# Patient Record
Sex: Female | Born: 1940
Health system: Southern US, Community
[De-identification: ages and names within clinical notes are randomized; demographics above are authoritative.]

## PROBLEM LIST (undated history)

## (undated) DIAGNOSIS — I4891 Unspecified atrial fibrillation: Secondary | ICD-10-CM

## (undated) DIAGNOSIS — R0602 Shortness of breath: Secondary | ICD-10-CM

## (undated) DIAGNOSIS — T7840XA Allergy, unspecified, initial encounter: Secondary | ICD-10-CM

## (undated) DIAGNOSIS — F329 Major depressive disorder, single episode, unspecified: Secondary | ICD-10-CM

## (undated) DIAGNOSIS — F32A Depression, unspecified: Secondary | ICD-10-CM

## (undated) DIAGNOSIS — R232 Flushing: Secondary | ICD-10-CM

## (undated) DIAGNOSIS — F419 Anxiety disorder, unspecified: Secondary | ICD-10-CM

## (undated) DIAGNOSIS — R05 Cough: Secondary | ICD-10-CM

## (undated) DIAGNOSIS — M3500 Sicca syndrome, unspecified: Secondary | ICD-10-CM

## (undated) DIAGNOSIS — M1990 Unspecified osteoarthritis, unspecified site: Secondary | ICD-10-CM

## (undated) DIAGNOSIS — H269 Unspecified cataract: Secondary | ICD-10-CM

## (undated) DIAGNOSIS — R059 Cough, unspecified: Secondary | ICD-10-CM

## (undated) DIAGNOSIS — J449 Chronic obstructive pulmonary disease, unspecified: Secondary | ICD-10-CM

## (undated) HISTORY — DX: Unspecified osteoarthritis, unspecified site: M19.90

## (undated) HISTORY — DX: Anxiety disorder, unspecified: F41.9

## (undated) HISTORY — PX: COLONOSCOPY: SHX174

## (undated) HISTORY — PX: DILATION AND CURETTAGE OF UTERUS: SHX78

## (undated) HISTORY — DX: Flushing: R23.2

## (undated) HISTORY — DX: Sjogren syndrome, unspecified: M35.00

---

## 1963-07-23 HISTORY — PX: BREAST SURGERY: SHX581

## 1971-07-23 HISTORY — PX: ABDOMINAL HYSTERECTOMY: SHX81

## 1998-11-27 ENCOUNTER — Ambulatory Visit (HOSPITAL_COMMUNITY): Admission: RE | Admit: 1998-11-27 | Discharge: 1998-11-27 | Payer: Self-pay | Admitting: *Deleted

## 2000-06-02 ENCOUNTER — Encounter: Payer: Self-pay | Admitting: Emergency Medicine

## 2000-06-03 ENCOUNTER — Inpatient Hospital Stay (HOSPITAL_COMMUNITY): Admission: EM | Admit: 2000-06-03 | Discharge: 2000-06-04 | Payer: Self-pay | Admitting: Emergency Medicine

## 2000-07-22 HISTORY — PX: CHOLECYSTECTOMY: SHX55

## 2000-10-17 ENCOUNTER — Inpatient Hospital Stay (HOSPITAL_COMMUNITY): Admission: EM | Admit: 2000-10-17 | Discharge: 2000-10-19 | Payer: Self-pay

## 2001-01-28 ENCOUNTER — Other Ambulatory Visit: Admission: RE | Admit: 2001-01-28 | Discharge: 2001-01-28 | Payer: Self-pay | Admitting: Family Medicine

## 2003-12-02 ENCOUNTER — Emergency Department (HOSPITAL_COMMUNITY): Admission: EM | Admit: 2003-12-02 | Discharge: 2003-12-03 | Payer: Self-pay | Admitting: Emergency Medicine

## 2003-12-21 ENCOUNTER — Other Ambulatory Visit: Admission: RE | Admit: 2003-12-21 | Discharge: 2003-12-21 | Payer: Self-pay | Admitting: Family Medicine

## 2006-01-16 ENCOUNTER — Other Ambulatory Visit: Admission: RE | Admit: 2006-01-16 | Discharge: 2006-01-16 | Payer: Self-pay | Admitting: Family Medicine

## 2007-04-08 ENCOUNTER — Ambulatory Visit: Payer: Self-pay | Admitting: Internal Medicine

## 2007-04-22 ENCOUNTER — Ambulatory Visit: Payer: Self-pay | Admitting: Internal Medicine

## 2007-04-22 ENCOUNTER — Encounter: Payer: Self-pay | Admitting: Internal Medicine

## 2008-07-22 HISTORY — PX: CERVICAL FUSION: SHX112

## 2008-08-25 ENCOUNTER — Encounter: Admission: RE | Admit: 2008-08-25 | Discharge: 2008-08-25 | Payer: Self-pay | Admitting: Family Medicine

## 2008-10-05 ENCOUNTER — Inpatient Hospital Stay (HOSPITAL_COMMUNITY): Admission: RE | Admit: 2008-10-05 | Discharge: 2008-10-06 | Payer: Self-pay | Admitting: Neurological Surgery

## 2008-11-21 ENCOUNTER — Encounter: Admission: RE | Admit: 2008-11-21 | Discharge: 2008-11-21 | Payer: Self-pay | Admitting: Neurological Surgery

## 2008-12-20 ENCOUNTER — Encounter: Admission: RE | Admit: 2008-12-20 | Discharge: 2008-12-20 | Payer: Self-pay | Admitting: Neurological Surgery

## 2008-12-27 ENCOUNTER — Encounter: Admission: RE | Admit: 2008-12-27 | Discharge: 2008-12-27 | Payer: Self-pay | Admitting: Neurological Surgery

## 2010-11-01 LAB — DIFFERENTIAL
Basophils Absolute: 0.1 10*3/uL (ref 0.0–0.1)
Basophils Relative: 1 % (ref 0–1)
Eosinophils Absolute: 0.1 10*3/uL (ref 0.0–0.7)
Eosinophils Relative: 1 % (ref 0–5)
Lymphocytes Relative: 25 % (ref 12–46)
Lymphs Abs: 2.4 10*3/uL (ref 0.7–4.0)
Monocytes Absolute: 0.6 10*3/uL (ref 0.1–1.0)
Monocytes Relative: 6 % (ref 3–12)
Neutro Abs: 6.4 10*3/uL (ref 1.7–7.7)
Neutrophils Relative %: 66 % (ref 43–77)

## 2010-11-01 LAB — BASIC METABOLIC PANEL
BUN: 9 mg/dL (ref 6–23)
CO2: 29 mEq/L (ref 19–32)
Calcium: 9.4 mg/dL (ref 8.4–10.5)
Chloride: 103 mEq/L (ref 96–112)
Creatinine, Ser: 0.69 mg/dL (ref 0.4–1.2)
GFR calc Af Amer: >60 mL/min (ref >60)
GFR calc non Af Amer: >60 mL/min (ref >60)
Glucose, Bld: 91 mg/dL (ref 70–99)
Potassium: 4.3 mEq/L (ref 3.5–5.1)
Sodium: 139 mEq/L (ref 135–145)

## 2010-11-01 LAB — PROTIME-INR
INR: 1 (ref 0.00–1.49)
Prothrombin Time: 13.8 seconds (ref 11.6–15.2)

## 2010-11-01 LAB — CBC
HCT: 41.5 % (ref 36.0–46.0)
Hemoglobin: 14.5 g/dL (ref 12.0–15.0)
MCHC: 34.9 g/dL (ref 30.0–36.0)
MCV: 91.3 fL (ref 78.0–100.0)
Platelets: 231 10*3/uL (ref 150–400)
RBC: 4.54 MIL/uL (ref 3.87–5.11)
RDW: 12.6 % (ref 11.5–15.5)
WBC: 9.6 10*3/uL (ref 4.0–10.5)

## 2010-11-01 LAB — APTT: aPTT: 32 seconds (ref 24–37)

## 2010-12-04 NOTE — Op Note (Signed)
NAMEKRISLYNN, GRONAU                ACCOUNT NO.:  [id number]   MEDICAL RECORD NO.:  [id number]          PATIENT TYPE:  INP   LOCATION:  3534                         FACILITY:  [facility]   PHYSICIAN:  Tia Alert, MD     DATE OF BIRTH:  [birth year]   DATE OF PROCEDURE:  10/05/2008  DATE OF DISCHARGE:                               OPERATIVE REPORT   PREOPERATIVE DIAGNOSES:  Cervical spondylosis C5-6 with biforaminal  stenosis with neck and bilateral arm pain.   POSTOPERATIVE DIAGNOSIS:  Cervical spondylosis C5-6 with biforaminal  stenosis with neck and bilateral arm pain.   PROCEDURE:  1. Decompressive anterior cervical diskectomy C5-6.  2. Anterior cervical arthrodesis C5-6 utilizing a 6-mm      corticocancellous allograft.  3. Anterior cervical plating C5-6 utilizing a 23-mm Atlantis venture      plate.   SURGEON:  Tia Alert, MD   ASSISTANT:  Donalee Citrin, MD   ANESTHESIA:  General endotracheal.   COMPLICATIONS:  None apparent.   INDICATIONS FOR PROCEDURE:  Ms. Michelle Gonzales is a pleasant 70 year old female  who presented with a long history of neck pain with bilateral arm pain,  this seem to follow up at C6 distribution.  Her right side was worse  than her left side.  She had tried medical management for quite some  time without significant relief.  She had an MRI, which showed cervical  spondylosis at C5-6 with biforaminal stenosis.  I recommended a  decompressive anterior cervical diskectomy fusion plating at C5-6.  She  understood the risks, the benefits, and expected outcome and wished to  proceed.   DESCRIPTION OF THE PROCEDURE:  The patient was taken to the operating  room and after induction of adequate generalized endotracheal  anesthesia, she was placed in supine position.  On the operating room  table, her right anterior cervical region was prepped and draped in  usual sterile fashion.  A 5 mL of local anesthesia was injected and a  small transverse incision was made  to the right midline and carried down  to the platysma, which was elevated, opened and undermined with  Metzenbaum scissors.  I then dissected plane medial to the  sternocleidomastoid muscle and internal carotid artery, and lateral to  the trachea and esophagus to expose C5-6.  Intraoperative fluoroscopy  confirmed my level and I then took down the longus colli muscles  bilaterally to expose C5-6.  The annulus was incised and the initial  diskectomy was done with pituitary rongeurs and curved curettes.  I used  a 3-mm Kerrison punch to remove the anterior-inferior part of the  endplate of C5.  We then used the high-speed drill to drill the  endplates in a rectangular fashion widening the disk space to 6 mm.  I  drilled down to the level of posterior longitudinal ligament brought in  up with the operating microscope and then opened the posterior  longitudinal ligament and removed it while undercutting the bodies of C5  and C6.  She had significant overgrown osteophytic ridging over the  foramina bilaterally.  We freshened the  C6 nerve root marching along the  superior endplate of C6 to identify the pedicles and then marched along  the pedicles to identify the C6 nerve roots and decompressed them  distally into the respective foramina.  We then palpated  circumferentially with a nerve hook to assure adequate decompression of  both the central canal and open nerve roots and therefore, via  visualization and palpation, we felt like we had a good decompression  within measuring interspace to be 6 mm.  I irrigated the interspace and  then placed a 6-mm corticocancellous allograft into the interspace at C5-  6.  We then used a 23-mm venture plate and placed two 13-mm variable  angle screws in the bodies of C5 and C6, and these locked into the plate  by the locking mechanism within the plate.  I then irrigated with saline  solution containing bacitracin, dried all bleeding points with bipolar   cautery and with surgery foam and once meticulous hemostasis was  achieved, closed with the platysma with 3-0 Vicryl and closed the  subcuticular tissue with 0 Vicryl, and closed the skin with Benzoin  Steri-Strips.  The drapes were removed.  Sterile dressing was applied.  The patient was awakened from general anesthesia and transferred to  recovery room in stable condition.  At the end of the procedure all  sponge, needle, and instrument counts were correct.      Tia Alert, MD  Electronically Signed     DSJ/MEDQ  D:  10/05/2008  T:  10/05/2008  Job:  [id number]

## 2010-12-07 NOTE — Op Note (Signed)
[place]. [facility]  Patient:    Michelle Gonzales, Michelle Gonzales                       MRN: [id number] Proc. Date: 10/18/00 Adm. Date:  [id number] Disc. Date: [id number] Attending:  Caleen Essex                           Operative Report  PREOPERATIVE DIAGNOSIS:  Cholecystitis.  POSTOPERATIVE DIAGNOSIS:  Cholecystitis.  OPERATION PERFORMED:  Laparoscopic cholecystectomy.  SURGEON:  Chevis Pretty, M.D.  ASSISTANT:  Velora Heckler, M.D.  ANESTHESIA:  General endotracheal.  DESCRIPTION OF PROCEDURE:  After informed consent was obtained, the patient was brought to the operating room and placed in supine position on the operating table.  After adequate induction of general anesthesia, the patients abdomen was prepped with Betadine and draped in the usual sterile manner.  A small superumbilical transverse incision was made with a 15 blade knife after infiltrating this area with 0.25% Marcaine.  This incision was carried down through the subcutaneous tissues using blunt dissection with a Kelly clamp and [employer]-Nav[employer] retractors until the fascia of the linea alba was identified.  The linea alba was incised with a 15 blade knife.  The inside was grasped between Kocher clamps and elevated.  The preperitoneal space was probed bluntly with a hemostat until the peritoneum was opened and access was gained to the abdominal cavity. A finger was inserted through this hole to palpate the abdominal wall.  There were no apparent adhesions.  A 0 Vicryl pursestring suture was then placed in the fascia around this hole. A Hasson cannula was then placed through this hole into the abdominal cavity and anchored with the previously placed Vicryl pursestring suture.  The laparoscope was then placed through the Hasson cannula and the abdomen was insufflated with carbon dioxide.  The liver and dome of the gallbladder were easily identifiable.  An upper midline incision was made with a 15 blade knife.  A  small transverse upper midline incision was made with a 15 blade knife after infiltrating this area with 0.25% Marcaine.  A 10 mm port was then placed bluntly through this incision.   The abdominal cavity was entered under direct vision.  Two sites were chosen laterally on the right side of the abdomen below the costal margin for placement of the 5 mm ports.  These areas were infiltrated with 0.25% Marcaine and small incisions were made with a 15 blade knife.  The 5 mm ports were then placed bluntly through these incisions into the abdominal cavity again under direct vision.  A blunt grasper was placed through the lateral most 5 mm port and used to grasp the dome of the gallbladder and elevate it anteriorly and superiorly.  Another blunt grasper was placed through the other 5 mm port and used to retract on the body and neck of the gallbladder.  A [place] dissector was placed through the upper midline port and using a combination of blunt dissection and use of the Bovie electrocautery the peritoneal reflection in the area of the gallbladder neck was opened.  Blunt dissection was carried down until the gallbladder neck cystic duct junction was readily identified.  Care was taken to keep the common duct medial to all this dissection.  The gallbladder neck cystic duct junction was dissected in a circumferential manner until the junction was clearly identified.  Three clips were then placed  proximally and one distally on the cystic duct and the duct was divided between the two.  The cystic artery was identified and again dissected circumferentially in a blunt manner with a Art gallery manager.  Two clips were placed proximally and one distally in the cystic artery and the artery was divided between the two.  The gallbladder was then removed from the liver bed using a hook cautery.  Prior to completely removing the gallbladder from the liver bed, the liver  bed was inspected and several small  bleeding points were coagulated with the Bovie electrocautery.  The gallbladder was then removed the rest of the way from the liver bed with the hook electrocautery.  The laparoscope was then placed through the upper midline port and an endoscopic bag was placed through the Hasson cannula  The gallbladder was placed within the endoscopic bag and the gallbladder was removed with the bag and the Hasson cannula through the superumbilical port.  The Hasson cannula was then replaced through the superumbilical hole.  The laparoscope was moved back to the Hasson cannula. The abdomen was then again inspected and the liver bed was found to be hemostatic.  The abdomen was irrigated with copious amounts of saline until the effluent was clear.  The ports were then all removed under direct vision. The fascia of the superumbilical port was then closed with the previously placed Vicryl pursestring suture.  The skin incisions were all closed with interrupted 4-0 Monocryl subcuticular stitches. Benzoin and Steri-Strips were then applied.  The patient tolerated the procedure well.  At the end of the case all needle, sponge and instrument counts were correct.  The patient was awakened and taken to the recovery room in stable condition. DD:  10/21/00 TD:  10/21/00 Job: [id number]

## 2010-12-07 NOTE — Cardiovascular Report (Signed)
[place]. [facility]  Patient:    Michelle Gonzales, Michelle Gonzales                       MRN: [id number] Proc. Date: 06/04/00 Adm. Date:  [id number] Attending:  Rollene Rotunda CC:         [name], M.D.  Rollene Rotunda, M.D. [facility]  Cardiac Cath Lab   Cardiac Catheterization  PROCEDURES PERFORMED:  Left heart catheterization, coronary angiography, left ventriculography.  INDICATIONS:  Ms. Fronek is a 70 year old woman who presented with chest pain and was referred for cardiac catheterization.  DESCRIPTION OF PROCEDURE:  A 6 French sheath was placed in the right femoral artery.  Standard Judkins 6 French catheters were utilized.  Contrast was Omnipaque.  There were no complications.  RESULTS:  HEMODYNAMICS:  Left ventricular pressure 136/14.  Aortic pressure 134/62. There was no aortic valve gradient.  LEFT VENTRICULOGRAM:  Wall motion is normal.  Ejection fraction calculated at 63%.  There is 2+ mild mitral regurgitation.  CORONARY ARTERIOGRAPHY:  (Right dominant).  Left main:  Normal.  Left anterior descending:  The LAD has a 25% stenosis in the mid vessel.  The LAD gives rise to three small diagonal branches.  Left circumflex:  The left circumflex gives rise to a large first marginal branch and a small second marginal branch.  There is a less than 20% stenosis in the first marginal branch.  Right coronary artery:  The right coronary artery gives rise to a normal size posterior descending artery and a small posterolateral branch.  There is a 20% stenosis in the proximal vessel at the tip of the catheter which may be catheter-induced spasm.  Otherwise the right coronary artery is free of angiographic disease.  IMPRESSIONS: 1. Normal left ventricular systolic function with mild mitral regurgitation. 2. Insignificant coronary artery disease. DD:  06/04/00 TD:  06/04/00 Job: [id number]

## 2010-12-07 NOTE — H&P (Signed)
[place]. [facility]  Patient:    Michelle Gonzales, Michelle Gonzales                       MRN: [id number] Adm. Date:  [id number] Attending:  Caleen Essex                         History and Physical  HISTORY OF PRESENT ILLNESS:  Ms. Bickham is a 70 year old white female who developed the acute onset of abdominal pain at about 2 oclock this morning. This has been accompanied by nausea but no vomiting.  The pain has been mostly in the epigastric region and down the right side of her abdomen.  She has had pains like this in the past that have seemed more cardiac in nature but a complete cardiac workup on multiple occasions has been negative.  She denies any fevers, chills, chest pains, shortness of breath, diarrhea, dysuria at home.  Her other review of systems is unremarkable.  PAST MEDICAL HISTORY:  Hiatal hernia, depression, and reflux, as well as hot flashes.  PAST SURGICAL HISTORY:  Transvaginal hysterectomy.  MEDICATIONS: 1. Nexium. 2. Paxil. 3. Premarin. 4. Ritalin.  ALLERGIES: 1. PENICILLIN. 2. CODEINE.  SOCIAL HISTORY:  She smokes about a half pack to a pack of cigarettes a day and only occasionally uses alcohol.  FAMILY HISTORY:  Lymphoma and heart problems.  PHYSICAL EXAMINATION:  GENERAL:  She is a well-developed, well-nourished white female lying uncomfortably in bed.  SKIN:  Warm and dry with no jaundice.  HEENT:  Her extraocular muscles are intact.  Pupils are equal, round and reactive to light.  NECK:  No bruits.  LUNGS:  Clear to auscultation bilaterally.  HEART:  Regular rate and rhythm.  ABDOMEN:  Soft with moderate tenderness in the right upper quadrant and right side.  EXTREMITIES:  No cyanosis, clubbing, or edema.  NEUROLOGIC:  She is alert and oriented x 4.  HEMATOLOGIC:  I could palpate no lymphadenopathy.  LABORATORY DATA:  White count 18,000.  Her liver functions were essentially normal.  A right upper quadrant ultrasound was  significant for a gallbladder with stones in it, a thickened gallbladder wall, and some pericholecystic fluid.  She had no evidence for any ductal dilatation.  ASSESSMENT AND PLAN:  This is a 70 year old white female who appears to have symptomatic cholelithiasis with cholecystitis.  We will plan to admit her for IV hydration and broad-spectrum antibiotic therapy.  If we can improve the inflammation associated with the gallbladder, we may be able to take her gallbladder out in the next couple of days in the operating room.  I suspect she will need this operation to prevent this from occurring any more in the future.  DD:  10/17/00 TD:  10/18/00 Job: [id number]

## 2010-12-07 NOTE — Discharge Summary (Signed)
[place]. [facility]  Patient:    Michelle Gonzales, Michelle Gonzales                       MRN: [id number] Adm. Date:  [id number] Disc. Date: 06/04/00 Attending:  Rollene Rotunda Dictator:   Delton See, P.A. CC:         Titus Dubin. Alwyn Ren, M.D. [facility]   Referring Physician Discharge [place]  DATE OF BIRTH:  August 22, 2040  HISTORY ON ADMISSION:  This is a 70 year old female with no prior cardiac history, who was admitted to [facility]. [facility] on June 02, 2000, by Rollene Rotunda, M.D., for evaluation of chest pain.  The patient recently had a GI evaluation with upper and lower endoscopy.  She was found to have a hiatal hernia, but no other abnormalities.  PAST MEDICAL HISTORY:  Significant for elevated cholesterol levels.  PAST SURGICAL HISTORY:  The patient is status post partial hysterectomy.  ALLERGIES:  PENICILLIN, SULFA, CODEINE, and TYLOX.  SOCIAL HISTORY:  The patient smokes one half pack of cigarettes per day and has done so for 40 years.  HOSPITAL COURSE:  As noted, this patient was admitted to [facility]. [facility] for further evaluation of chest pain.  Cardiac enzymes were performed.  These were found to be negative for an MI.  The patient was scheduled for cardiac catheterization.  Rollene Rotunda, M.D., noted that the patient had been placed on Paxil and nicotine patches for smoking cessation.  However, she has been taking the patches off recently after work to smoke.  It was recommended that the patient make a better effort to stop smoking.  On June 04, 2000, the patient underwent cardiac catheterization performed by Daisey Must, M.D.  She had some mild 20% and 25% lesions, but no significant high-grade stenosis of any of her arteries.  There was a question of a small amount of catheter-induced spasm of the RCA during this study.  She had normal LV function with normal ejection fraction of 63%.  No wall motion abnormalities.   There was 2+ mild mitral regurgitation.  A D-dimer was performed.  This was found to be 0.47.  A spiral CT was not felt to be indicated.  Arrangements were made to discharge the patient home following her catheterization.  LABORATORY DATA:  As noted, cardiac enzymes were negative.  A CBC was within normal limits.  A chemistry profile was within normal limits.  A lipid profile revealed cholesterol 213, triglycerides 103, HDL 56, LDL 136.  A chest x-ray showed no active disease.  An EKG showed normal sinus rhythm and was interpreted as a normal EKG.  DISCHARGE MEDICATIONS:  The patient was discharged in Ritalin, Premarin, Paxil, Prevacid, and Restoril (same dosages as before).  The patient was not certain of her dosages.  She was also told to take a coated aspirin once each day.  ACTIVITY:  To be as tolerated, but no strenuous activity, driving, or sexual activity for two days.  DIET:  She was to be on a low-salt, low-fat diet.  SPECIAL INSTRUCTIONS:  She was told to call the office if she had any increased pain, swelling, or bleeding from her groin.  She was told to quit smoking.  FOLLOW-UP:  She was to follow up in the [place] office for a groin check on June 19, 2000, at 12:15 p.m.  She was to see Titus Dubin. Alwyn Ren, M.D., in two to three weeks.  PROBLEM LIST AT THE  TIME OF DISCHARGE: 1. Chest pain.  Negative cardiac enzymes. 2. Cardiac catheterization on June 04, 2000.  Please see results as noted    above. 3. Abnormal cholesterol profile as noted above. 4. Positive tobacco history. 5. Recent gastrointestinal work-up apparently negative. 6. Negative D-dimer. DD:  06/04/00 TD:  06/04/00 Job: [id number]

## 2012-03-11 ENCOUNTER — Encounter: Payer: Self-pay | Admitting: Internal Medicine

## 2012-05-26 ENCOUNTER — Other Ambulatory Visit: Payer: Self-pay | Admitting: Gastroenterology

## 2012-11-13 ENCOUNTER — Encounter: Payer: Self-pay | Admitting: Internal Medicine

## 2012-11-17 ENCOUNTER — Telehealth: Payer: Self-pay | Admitting: Internal Medicine

## 2012-11-17 NOTE — Telephone Encounter (Signed)
Patient called to say she received our Recall Project letter, but has switched care to [facility] and already had her colonoscopy.

## 2013-01-06 ENCOUNTER — Other Ambulatory Visit: Payer: Self-pay | Admitting: Radiology

## 2013-01-07 ENCOUNTER — Other Ambulatory Visit: Payer: Self-pay | Admitting: Radiology

## 2013-01-07 DIAGNOSIS — C50911 Malignant neoplasm of unspecified site of right female breast: Secondary | ICD-10-CM

## 2013-01-08 ENCOUNTER — Telehealth: Payer: Self-pay | Admitting: *Deleted

## 2013-01-08 NOTE — Telephone Encounter (Signed)
Received call back from patient to confirm her [facility] appt. For 01/13/13 at 0800.  Instructions and contact information given.  No further questions at this time.

## 2013-01-08 NOTE — Telephone Encounter (Signed)
Unable to reach patient or leave voicemail. Will attempt to call again.

## 2013-01-11 ENCOUNTER — Ambulatory Visit
Admission: RE | Admit: 2013-01-11 | Discharge: 2013-01-11 | Disposition: A | Discharge status: Home / Self Care | Payer: PRIVATE HEALTH INSURANCE | Source: Ambulatory Visit | Attending: Radiology | Admitting: Radiology

## 2013-01-11 DIAGNOSIS — C50911 Malignant neoplasm of unspecified site of right female breast: Secondary | ICD-10-CM

## 2013-01-11 MED ORDER — GADOBENATE DIMEGLUMINE 529 MG/ML IV SOLN
15.0000 mL | Freq: Once | INTRAVENOUS | Status: AC | PRN
  Administered 2013-01-11: 15 mL via INTRAVENOUS

## 2013-01-12 ENCOUNTER — Other Ambulatory Visit: Payer: Self-pay | Admitting: Medical Oncology

## 2013-01-12 DIAGNOSIS — C50919 Malignant neoplasm of unspecified site of unspecified female breast: Secondary | ICD-10-CM

## 2013-01-13 ENCOUNTER — Encounter (INDEPENDENT_AMBULATORY_CARE_PROVIDER_SITE_OTHER): Payer: Self-pay | Admitting: General Surgery

## 2013-01-13 ENCOUNTER — Other Ambulatory Visit (HOSPITAL_BASED_OUTPATIENT_CLINIC_OR_DEPARTMENT_OTHER): Payer: Medicare Other | Admitting: Lab

## 2013-01-13 ENCOUNTER — Telehealth: Payer: Self-pay | Admitting: *Deleted

## 2013-01-13 ENCOUNTER — Encounter: Payer: Self-pay | Admitting: Oncology

## 2013-01-13 ENCOUNTER — Ambulatory Visit
Admission: RE | Admit: 2013-01-13 | Discharge: 2013-01-13 | Disposition: A | Discharge status: Home / Self Care | Payer: Medicare Other | Source: Ambulatory Visit | Attending: Radiation Oncology | Admitting: Radiation Oncology

## 2013-01-13 ENCOUNTER — Ambulatory Visit (HOSPITAL_BASED_OUTPATIENT_CLINIC_OR_DEPARTMENT_OTHER): Payer: Medicare Other | Admitting: Oncology

## 2013-01-13 ENCOUNTER — Ambulatory Visit (HOSPITAL_BASED_OUTPATIENT_CLINIC_OR_DEPARTMENT_OTHER): Payer: PRIVATE HEALTH INSURANCE

## 2013-01-13 ENCOUNTER — Other Ambulatory Visit: Payer: Self-pay | Admitting: Oncology

## 2013-01-13 ENCOUNTER — Ambulatory Visit (HOSPITAL_BASED_OUTPATIENT_CLINIC_OR_DEPARTMENT_OTHER): Payer: PRIVATE HEALTH INSURANCE | Admitting: General Surgery

## 2013-01-13 ENCOUNTER — Encounter: Payer: Self-pay | Admitting: Specialist

## 2013-01-13 ENCOUNTER — Ambulatory Visit: Payer: Medicare Other | Attending: General Surgery | Admitting: Physical Therapy

## 2013-01-13 ENCOUNTER — Encounter: Payer: Self-pay | Admitting: *Deleted

## 2013-01-13 VITALS — BP 144/79 | HR 86 | Temp 99.1°F | Resp 20 | Ht 61.0 in | Wt 159.0 lb

## 2013-01-13 DIAGNOSIS — C50919 Malignant neoplasm of unspecified site of unspecified female breast: Secondary | ICD-10-CM | POA: Insufficient documentation

## 2013-01-13 DIAGNOSIS — R293 Abnormal posture: Secondary | ICD-10-CM | POA: Insufficient documentation

## 2013-01-13 DIAGNOSIS — C50411 Malignant neoplasm of upper-outer quadrant of right female breast: Secondary | ICD-10-CM

## 2013-01-13 DIAGNOSIS — Z01818 Encounter for other preprocedural examination: Secondary | ICD-10-CM | POA: Insufficient documentation

## 2013-01-13 DIAGNOSIS — C50911 Malignant neoplasm of unspecified site of right female breast: Secondary | ICD-10-CM

## 2013-01-13 DIAGNOSIS — M1710 Unilateral primary osteoarthritis, unspecified knee: Secondary | ICD-10-CM | POA: Insufficient documentation

## 2013-01-13 DIAGNOSIS — M25519 Pain in unspecified shoulder: Secondary | ICD-10-CM | POA: Insufficient documentation

## 2013-01-13 DIAGNOSIS — C50419 Malignant neoplasm of upper-outer quadrant of unspecified female breast: Secondary | ICD-10-CM

## 2013-01-13 DIAGNOSIS — IMO0001 Reserved for inherently not codable concepts without codable children: Secondary | ICD-10-CM | POA: Insufficient documentation

## 2013-01-13 LAB — COMPREHENSIVE METABOLIC PANEL (CC13)
ALT: 19 U/L (ref 0–55)
AST: 19 U/L (ref 5–34)
Albumin: 3.4 g/dL — ABNORMAL LOW (ref 3.5–5.0)
Alkaline Phosphatase: 93 U/L (ref 40–150)
BUN: 13.8 mg/dL (ref 7.0–26.0)
CO2: 24 mEq/L (ref 22–29)
Calcium: 8.7 mg/dL (ref 8.4–10.4)
Chloride: 100 mEq/L (ref 98–107)
Creatinine: 0.8 mg/dL (ref 0.6–1.1)
Glucose: 98 mg/dl (ref 70–99)
Potassium: 3.7 mEq/L (ref 3.5–5.1)
Sodium: 135 mEq/L — ABNORMAL LOW (ref 136–145)
Total Bilirubin: 0.5 mg/dL (ref 0.20–1.20)
Total Protein: 7 g/dL (ref 6.4–8.3)

## 2013-01-13 LAB — CBC WITH DIFFERENTIAL/PLATELET
BASO%: 0.8 % (ref 0.0–2.0)
Basophils Absolute: 0.1 10*3/uL (ref 0.0–0.1)
EOS%: 1.6 % (ref 0.0–7.0)
Eosinophils Absolute: 0.2 10*3/uL (ref 0.0–0.5)
HCT: 40.8 % (ref 34.8–46.6)
HGB: 13.9 g/dL (ref 11.6–15.9)
LYMPH%: 20.9 % (ref 14.0–49.7)
MCH: 31.2 pg (ref 25.1–34.0)
MCHC: 34.2 g/dL (ref 31.5–36.0)
MCV: 91.1 fL (ref 79.5–101.0)
MONO#: 0.7 10*3/uL (ref 0.1–0.9)
MONO%: 6.4 % (ref 0.0–14.0)
NEUT#: 7.3 10*3/uL — ABNORMAL HIGH (ref 1.5–6.5)
NEUT%: 70.3 % (ref 38.4–76.8)
Platelets: 214 10*3/uL (ref 145–400)
RBC: 4.47 10*6/uL (ref 3.70–5.45)
RDW: 13.1 % (ref 11.2–14.5)
WBC: 10.4 10*3/uL — ABNORMAL HIGH (ref 3.9–10.3)
lymph#: 2.2 10*3/uL (ref 0.9–3.3)

## 2013-01-13 NOTE — Telephone Encounter (Signed)
Called pt back making her aware that i gv the wrong d/t . gv the correct appt d/t for 02/24/13@ 9:45am...td

## 2013-01-13 NOTE — Progress Notes (Signed)
Radiation Oncology         [phone]) [phone] ________________________________  Initial outpatient Consultation - Date: 01/13/2013   Name: Michelle Gonzales MRN: [id number]   DOB: 1940-12-07  REFERRING PHYSICIAN: Adolph Pollack, MD  DIAGNOSIS: T1bN0 Invasive Ductal Carcinoma of the right breast  HISTORY OF PRESENT ILLNESS::Michelle Gonzales is a 72 y.o. female  was recalled from screening for a right breast nodule. This measured 3-4 mm on ultrasound. A biopsy was performed which showed a grade 2 invasive ductal carcinoma associated with high-grade DCIS. This was ER positive PR positive HER-2 negative Ki-67 was 10%. An MRI of the bilateral breasts was performed which showed an 8 mm area of enhancement in the right breast. The left breast was negative and there is no evidence of lymphatic involvement. He does have a history of melanoma removed from her nose. She is GX P2 and is postmenopausal. She never used hormone replacement. She does continue to smoke and smokes about half a pack per day. Is accompanied by her husband.Marland Kitchen  PREVIOUS RADIATION THERAPY: No  PAST MEDICAL HISTORY: Status post skin cancer removal, status post cholecystectomy number next status post bladder tach number next status post rectal hysterectomy status post 2) biopsies.  FAMILY HISTORY: She had a mother with breast cancer at the age of 71. She has no brothers or sisters.  SOCIAL HISTORY:  History  Substance Use Topics  . Smoking status: Not on file  . Smokeless tobacco: Not on file  . Alcohol Use: Not on file    ALLERGIES: Penicillins; Codeine; and Sulfa antibiotics  MEDICATIONS:  Current Outpatient Prescriptions  Medication Sig Dispense Refill  . azelastine (ASTELIN) 137 MCG/SPRAY nasal spray Place 1 spray into the nose 2 (two) times daily. Use in each nostril as directed      . beta carotene w/minerals (OCUVITE) tablet Take 1 tablet by mouth daily.      . cholecalciferol (VITAMIN D) 1000 UNITS tablet Take 1,000 Units  by mouth daily.      . Fluticasone-Salmeterol (ADVAIR) 100-50 MCG/DOSE AEPB Inhale 2 puffs into the lungs every morning.      . Misc Natural Products (OSTEO BI-FLEX JOINT SHIELD PO) Take by mouth daily.      . Multiple Vitamins-Minerals (CENTRUM PO) Take by mouth daily.      . naproxen (NAPROSYN) 250 MG tablet Take 50 mg by mouth daily.      . paroxetine mesylate (PEXEVA) 40 MG tablet Take 40 mg by mouth every morning. Takes 1/2 at night      . vitamin C (ASCORBIC ACID) 250 MG tablet Take 250 mg by mouth daily.       No current facility-administered medications for this encounter.    REVIEW OF SYSTEMS:  A 15 point review of systems is documented in the electronic medical record. This was obtained by the nursing staff. However, I reviewed this with the patient to discuss relevant findings and make appropriate changes.  Pertinent items are noted in HPI.   PHYSICAL EXAM: There were no vitals filed for this visit.. . She is a pleasant female in no distress sitting comfortably examining table. She appears her stated age. She has no palpable cervical supraclavicular or axillary adenopathy. She has no palpable abnormalities in the left breast. The right breast there is some biopsy change associated with a biopsy site in the lower outer quadrant. She has no lymphedema.  LABORATORY DATA:  Lab Results  Component Value Date   WBC 10.4* 01/13/2013  HGB 13.9 01/13/2013   HCT 40.8 01/13/2013   MCV 91.1 01/13/2013   PLT 214 01/13/2013   Lab Results  Component Value Date   NA 135* 01/13/2013   K 3.7 01/13/2013   CL 100 01/13/2013   CO2 24 01/13/2013   Lab Results  Component Value Date   ALT 19 01/13/2013   AST 19 01/13/2013   ALKPHOS 93 01/13/2013   BILITOT 0.50 01/13/2013     RADIOGRAPHY: Mr Breast Bilateral W Wo Contrast  01/11/2013   *RADIOLOGY REPORT*  Clinical Data: Recently diagnosed right breast invasive ductal carcinoma and ductal carcinoma in situ.  BUN and creatinine were obtained on site at  [facility] at [address] Results:  BUN 14 mg/dL,  Creatinine 0.7 mg/dL.  BILATERAL BREAST MRI WITH AND WITHOUT CONTRAST  Technique: Multiplanar, multisequence MR images of both breasts were obtained prior to and following the intravenous administration of 15ml of multihance.  Three dimensional images were evaluated at the independent DynaCad workstation.  Comparison:  Mammograms dated 01/06/2013, 01/05/2013, 01/04/2013, 12/18/2011 and 12/13/2010 from [facility].  Findings: There is mild background parenchymal enhancement pattern.  No abnormal enhancement is seen in the left breast.  There is an 8 x 8 x 6 mm enhancing mass in the 9 o'clock region of the right breast. Post-biopsy changes and a signal void artifact from the biopsy clip are associated with the mass.  There is no enlarged axillary or internal mammary adenopathy.  IMPRESSION: Solitary enhancing mass in the 9 o'clock region of the right breast corresponding well with the known malignancy.  RECOMMENDATION: Treatment planning is recommended.  THREE-DIMENSIONAL MR IMAGE RENDERING ON INDEPENDENT WORKSTATION:  Three-dimensional MR images were rendered by post-processing of the original MR data on an independent workstation.  The three- dimensional MR images were interpreted, and findings were reported in the accompanying complete MRI report for this study.  BI-RADS CATEGORY 6:  Known biopsy-proven malignancy - appropriate action should be taken.   Original Report Authenticated By: Baird Lyons, M.D.      IMPRESSION: T1bN0 Invasive Ductal Carcinoma of the right breast  PLAN:  I spoke with the patient and her husband today regarding the role of radiation in the treatment of breast cancer. We discussed the equivalency in terms of survival between breast conservation a mastectomy. We discussed the role of radiation and decreasing local failures in patients who undergo lumpectomy. We discussed the process of simulation the placement  tattoos. We discussed 4-6 weeks of treatment as an outpatient. We discussed the use of breath hold technique for cardiac sparing. We discussed the possible side effects including but not limited to skin redness, fatigue, lung and heart damage. We discussed the increase in lung cancer seen when patients continued to smoke after receiving radiation. She and her husband are interested in quitting and have discussed nicotine replacement. I've also offered her information regarding her smoking cessation classes. We discussed the equivalency in terms of survival between lumpectomy plus radiation plus antiestrogen treatment versus lumpectomy plus antiestrogen treatment in women over 70. We discussed that radiation could be added to antiestrogen therapy or could be given alone. We discussed that there is no difference in survival between surgery plus antiestrogen therapy versus surgery plus radiation versus surgery plus radiation plus antiestrogen therapy. Given she and her husband previous experience with her sons malignancy I believe she is leaning toward surgery plus antiestrogen treatment. I will see her back after Dr. Abbey Chatters has released her postoperatively if she  is interested in radiation. I spent 30 minutes face to face with the patient and more than 50% of that time was spent in counseling and/or coordination of care.    ------------------------------------------------  Lurline Hare, MD

## 2013-01-13 NOTE — Progress Notes (Signed)
I met Ms. Michelle Gonzales and her husband today in breast clinic.  She rated her distress as "1".  She did have a concern regarding filing her insurance, in that she has to file it herself instead of it being filed by [facility].  I encouraged her to see a Artist, who might be of help.  In addition, I told her about support services, including the Breast Cancer Support Group.  She did not want a referral made at this time to [facility].  She and her husband both appeared to be dealing in a very positive way with her diagnosis.

## 2013-01-13 NOTE — Telephone Encounter (Signed)
Lm informing the Michelle Gonzales that kk would like to see her 02/15/13@11 :45. Made Michelle Gonzales aware that i will mail a letter/cal as well...td

## 2013-01-13 NOTE — Progress Notes (Signed)
Patient ID: Michelle Gonzales, female   DOB: 08/13/1940, 72 y.o.   MRN: [id number]  No chief complaint on file.   HPI [name] is a 72 y.o. female.   HPI  She is referred by Dr. Cornell for further evaluation and treatment of newly diagnosed invasive right breast cancer. She is noted to have a right breast nodule on screening mammogram. This was 4 mm by ultrasound at the 9:00 position. She underwent image guided biopsy. It demonstrated invasive ductal carcinoma. This was grade 2. ER/PR positive. HER-2 negative. Proliferation rate is 10%. MRI demonstrated an 8 mm lesion at that site.  Her mother had breast cancer at age 50. Menarche was at age 11. First live childbirth was at age 21. She is menopausal and has been so for approximately 30 years. No hormone replacement therapy. No masses in her breast. No breast pain. No skin changes. No nipple discharge. She had a previous biopsy of right breast masses that were benign.  She is here with her husband.  Past Medical History  Diagnosis Date  . Hot flashes   . Arthritis   . Anxiety     Sjogren's disease.  Past Surgical History  Procedure Laterality Date  . Abdominal hysterectomy      partial  . Cholecystectomy      Right breast biopsy  Family History  Problem Relation Age of Onset  . Breast cancer Mother     Social History History  Substance Use Topics  . Smoking status: Current Every Day Smoker -- 0.50 packs/day    Types: Cigarettes  . Smokeless tobacco: Not on file  . Alcohol Use: No    Allergies  Allergen Reactions  . Penicillins Anaphylaxis  . Codeine Other (See Comments)    Massive headache  . Sulfa Antibiotics Itching and Rash    Current Outpatient Prescriptions  Medication Sig Dispense Refill  . azelastine (ASTELIN) 137 MCG/SPRAY nasal spray Place 1 spray into the nose 2 (two) times daily. Use in each nostril as directed      . beta carotene w/minerals (OCUVITE) tablet Take 1 tablet by mouth daily.      .  cholecalciferol (VITAMIN D) 1000 UNITS tablet Take 1,000 Units by mouth daily.      . Fluticasone-Salmeterol (ADVAIR) 100-50 MCG/DOSE AEPB Inhale 2 puffs into the lungs every morning.      . Misc Natural Products (OSTEO BI-FLEX JOINT SHIELD PO) Take by mouth daily.      . Multiple Vitamins-Minerals (CENTRUM PO) Take by mouth daily.      . naproxen (NAPROSYN) 250 MG tablet Take 50 mg by mouth daily.      . paroxetine mesylate (PEXEVA) 40 MG tablet Take 40 mg by mouth every morning. Takes 1/2 at night      . vitamin C (ASCORBIC ACID) 250 MG tablet Take 250 mg by mouth daily.       No current facility-administered medications for this visit.    Review of Systems Review of Systems  Constitutional: Positive for fatigue.  HENT: Negative.   Respiratory: Positive for cough.   Cardiovascular: Positive for palpitations.  Genitourinary:       Some dribbling with urination  Musculoskeletal: Positive for arthralgias.  Neurological:       "forgetfulness"  Hematological: Bruises/bleeds easily.    There were no vitals taken for this visit.  Physical Exam Physical Exam  Constitutional: She appears well-developed and well-nourished. No distress.  HENT:  Head: Normocephalic and atraumatic.  Eyes:   No scleral icterus.  Neck: Neck supple.  No supraclavicular adenopathy  Cardiovascular: Normal rate and regular rhythm.   Pulmonary/Chest: Effort normal and breath sounds normal.  Breasts are symmetrical. Circumareolar scar in right breast. No dominant masses in either breast. No suspicious skin changes. No nipple discharge.  Abdominal: Soft. She exhibits no distension and no mass. There is no tenderness.  Musculoskeletal: She exhibits no edema.  Lymphadenopathy:    She has no cervical adenopathy.  Neurological: She is alert.  Skin: Skin is warm and dry.  Psychiatric: She has a normal mood and affect. Her behavior is normal.    Data Reviewed Mammogram, ultrasound, MRI, pathology report,  labs  Assessment    Delayed diagnosis invasive right breast cancer. She is a good candidate for breast conservation and is interested in this. She is also a smoker and I have asked her to stop before the surgery or cut down as much as possible to decrease the complication rate of wound healing.     Plan    Right breast lumpectomy after wire localization and right axillary sentinel lymph node biopsy.  I have explained the procedure, risks, and aftercare.  The risks include but are not limited to bleeding, infection, wound problems, seroma formation, anesthesia, nerve injury, lymphedema, need for reexcision or removal of more lymph nodes at a later time.  She seems to understand and agrees with the plan.        Tan Clopper J 01/13/2013, 11:58 AM    

## 2013-01-13 NOTE — Patient Instructions (Addendum)
My office will call you to schedule the surgery. 

## 2013-01-13 NOTE — Progress Notes (Signed)
Checked in new patient. No financial issues. All communication via phone/mail. She has living will/POA but not with her today. She did have her [facility] forms.

## 2013-01-13 NOTE — Progress Notes (Signed)
Michelle Gonzales [id number] 1940-09-24 72 y.o. 01/13/2013 12:26 PM  CC  Johny Blamer, MD [facility] [address] [place] [address] Dr. Lurline Hare Dr. Avel Peace  REASON FOR CONSULTATION:  72 year old female with new diagnosis of stage I right invasive ductal carcinoma of the breast. Patient is seen in the multidisciplinary breast clinic for discussion of treatment options. She was also seen by Dr. Lurline Hare Dr. Avel Peace.  STAGE:   Cancer of upper-outer quadrant of female breast   Primary site: Breast (Right)   Staging method: AJCC 7th Edition   Clinical: (T1b, NX, cM0)   Summary: (T1b, NX, cM0)  REFERRING PHYSICIAN: Dr. Avel Peace  HISTORY OF PRESENT ILLNESS:  Michelle Gonzales is a 72 y.o. female.  Would medical history significant for COPD Sjogren's disease and macular degeneration. Patient was seen for a six-month recall mammogram for a suspicious right breast nodule. This was at the 9:00 position. Because of this she underwent a repeat diagnostic mammogram performed on 01/05/2013. There was a small nodule that persisted appearing to be most likely irregular and margination. She had an ultrasound that demonstrated a small 3-4 mm nodular density. This appeared suspicious because of this patient underwent a stereotactic biopsy of the 9:00 nodule. The pathology showed an invasive ductal carcinoma with ductal carcinoma in situ. The tumor was grade 2 with associated high-grade DCIS. There was ER positive PR positive HER-2/neu negative with a Ki-67 of 10%. She had MRI of the breasts performed the MRI did not show any abnormal enhancement in the left breast. However in the right breast there was an 8 x 8 x 6 mm enhancing mass in the 9:00 region. There were post biopsy change is noted and biopsy clip was found. There were no other abnormalities. Patient's case was discussed at the multidisciplinary breast conference. She is now  seen in the multidisciplinary breast clinic for discussion of treatment options. She is oh otherwise doing well. She does have some shortness of breath dryness of her eyes mouth and vaginal dryness do to her Sjogren's disease. She is accompanied by her husband today. She was also seen by Dr. Lurline Hare and Dr. Abbey Chatters.   Past Medical History: Past Medical History  Diagnosis Date  . Hot flashes   . Arthritis   . Anxiety   . Sjogren's disease     Past Surgical History: Past Surgical History  Procedure Laterality Date  . Abdominal hysterectomy      partial  . Cholecystectomy    . Breast surgery Right     biopsy    Family History: Family History  Problem Relation Age of Onset  . Breast cancer Mother     Social History History  Substance Use Topics  . Smoking status: Current Every Day Smoker -- 0.50 packs/day    Types: Cigarettes  . Smokeless tobacco: Not on file  . Alcohol Use: No    Allergies: Allergies  Allergen Reactions  . Penicillins Anaphylaxis  . Codeine Other (See Comments)    Massive headache  . Sulfa Antibiotics Itching and Rash    Current Medications: Current Outpatient Prescriptions  Medication Sig Dispense Refill  . azelastine (ASTELIN) 137 MCG/SPRAY nasal spray Place 1 spray into the nose 2 (two) times daily. Use in each nostril as directed      . beta carotene w/minerals (OCUVITE) tablet Take 1 tablet by mouth daily.      . cholecalciferol (VITAMIN D) 1000 UNITS tablet Take 1,000  Units by mouth daily.      . Fluticasone-Salmeterol (ADVAIR) 100-50 MCG/DOSE AEPB Inhale 2 puffs into the lungs every morning.      . Misc Natural Products (OSTEO BI-FLEX JOINT SHIELD PO) Take by mouth daily.      . Multiple Vitamins-Minerals (CENTRUM PO) Take by mouth daily.      . naproxen (NAPROSYN) 250 MG tablet Take 50 mg by mouth daily.      . paroxetine mesylate (PEXEVA) 40 MG tablet Take 40 mg by mouth every morning. Takes 1/2 at night      . vitamin C  (ASCORBIC ACID) 250 MG tablet Take 250 mg by mouth daily.       No current facility-administered medications for this visit.    OB/GYN History: Patient had menarche at age 44 she underwent menopause in 1973 she has not been on hormone replacement therapy. First live birth was at age 68.  Fertility Discussion: Patient has completed her family Prior History of Cancer: No prior history  Health Maintenance:  Colonoscopy yes 2013 Bone Density yes 1998 Last PAP smear 2013  ECOG PERFORMANCE STATUS: 1 - Symptomatic but completely ambulatory  Genetic Counseling/testing: No  REVIEW OF SYSTEMS: Patient does experience night sweats and she does have significant fatigue and cramping of her legs. She has noticed some changes in her vision due to macular degeneration. She is denying any ear infections runny nose or sinus trouble. She does have an irregular heartbeat off-and-on no palpitations no swelling in her lower extremities no chest pains. She does have a dry cough some shortness of breath here and there. She denies any abdominal pain no nausea vomiting or heartburn. She has no changes in her bowel or bladder habits. She does have some good dribbling of urine. She's noticed the lump now after her biopsy. She does bruise easily she does have history of arthritis with joint pain and some difficulty in walking she does have some forgetfulness. She does have hot flashes. Remainder of the 14 point review of systems is unremarkable and scans separately into the electronic medical record.  PHYSICAL EXAMINATION: Blood pressure 144/79, pulse 86, temperature 99.1 F (37.3 C), temperature source Oral, resp. rate 20, height 5\' 1"  (1.549 m), weight 159 lb (72.122 kg).  Patient is awake alert well-developed nourished female in no acute distress HEENT exam EOMI PERRLA sclerae anicteric no conjunctival pallor oral mucosa is dry neck is supple lungs clear but distant breath sounds cardiovascular regular rate rhythm  abdomen is soft no HSM extremities no edema neuro patient's alert oriented otherwise nonfocal Left breast no masses nipple discharge no nodularity right breast reveals a palpable nodule at the biopsy site otherwise no masses nipple discharge or tenderness.    STUDIES/RESULTS: Mr Breast Bilateral W Wo Contrast  01/11/2013   *RADIOLOGY REPORT*  Clinical Data: Recently diagnosed right breast invasive ductal carcinoma and ductal carcinoma in situ.  BUN and creatinine were obtained on site at [facility] at [address] Results:  BUN 14 mg/dL,  Creatinine 0.7 mg/dL.  BILATERAL BREAST MRI WITH AND WITHOUT CONTRAST  Technique: Multiplanar, multisequence MR images of both breasts were obtained prior to and following the intravenous administration of 15ml of multihance.  Three dimensional images were evaluated at the independent DynaCad workstation.  Comparison:  Mammograms dated 01/06/2013, 01/05/2013, 01/04/2013, 12/18/2011 and 12/13/2010 from [facility].  Findings: There is mild background parenchymal enhancement pattern.  No abnormal enhancement is seen in the left breast.  There is  an 8 x 8 x 6 mm enhancing mass in the 9 o'clock region of the right breast. Post-biopsy changes and a signal void artifact from the biopsy clip are associated with the mass.  There is no enlarged axillary or internal mammary adenopathy.  IMPRESSION: Solitary enhancing mass in the 9 o'clock region of the right breast corresponding well with the known malignancy.  RECOMMENDATION: Treatment planning is recommended.  THREE-DIMENSIONAL MR IMAGE RENDERING ON INDEPENDENT WORKSTATION:  Three-dimensional MR images were rendered by post-processing of the original MR data on an independent workstation.  The three- dimensional MR images were interpreted, and findings were reported in the accompanying complete MRI report for this study.  BI-RADS CATEGORY 6:  Known biopsy-proven malignancy - appropriate action should be  taken.   Original Report Authenticated By: Baird Lyons, M.D.     LABS:    Chemistry      Component Value Date/Time   NA 135* 01/13/2013 0819   NA 139 09/30/2008 1353   K 3.7 01/13/2013 0819   K 4.3 09/30/2008 1353   CL 100 01/13/2013 0819   CL 103 09/30/2008 1353   CO2 24 01/13/2013 0819   CO2 29 09/30/2008 1353   BUN 13.8 01/13/2013 0819   BUN 9 09/30/2008 1353   CREATININE 0.8 01/13/2013 0819   CREATININE 0.69 09/30/2008 1353      Component Value Date/Time   CALCIUM 8.7 01/13/2013 0819   CALCIUM 9.4 09/30/2008 1353   ALKPHOS 93 01/13/2013 0819   AST 19 01/13/2013 0819   ALT 19 01/13/2013 0819   BILITOT 0.50 01/13/2013 0819      Lab Results  Component Value Date   WBC 10.4* 01/13/2013   HGB 13.9 01/13/2013   HCT 40.8 01/13/2013   MCV 91.1 01/13/2013   PLT 214 01/13/2013   PATHOLOGY: ADDITIONAL INFORMATION: CHROMOGENIC IN-SITU HYBRIDIZATION Results: HER-2/NEU BY CISH - NO AMPLIFICATION OF HER-2 DETECTED. RESULT RATIO OF HER2: CEP 17 SIGNALS 1.57 AVERAGE HER2 COPY NUMBER PER CELL 2.75 REFERENCE RANGE NEGATIVE HER2/Chr17 Ratio <2.0 and Average HER2 copy number <4.0 EQUIVOCAL HER2/Chr17 Ratio <2.0 and Average HER2 copy number 4.0 and <6.0 POSITIVE HER2/Chr17 Ratio >=2.0 and/or Average HER2 copy number >=6.0 Abigail Miyamoto MD Pathologist, Electronic Signature ( Signed 01/12/2013) FINAL DIAGNOSIS Diagnosis Breast, right, needle core biopsy, mass, 3 o'clock - INVASIVE DUCTAL CARCINOMA. DUCTAL CARCINOMA IN SITU. Microscopic Comment The core biopsies are involved by invasive ductal carcinoma consistent with grade II and high grade DCIS. Breast prognostic profile will be performed. Dr. Frederica Kuster has reviewed this case and agrees. Called to [employer] on 01/07/13. (JDP:gt, 01/07/13) Jimmy Picket MD Pathologist, Electronic Signature (Case signed 01/07/2013) 1 of 2 FINAL for PARNIKA, TWETEN ([id number]) S ASSESSMENT    72 year old female with  #1 new diagnosis of  invasive ductal carcinoma of the right breast presenting on we call mammogram. Patient had a 4 mm nodule on mammogram and ultrasound. MRI revealed the mass to be 8 mm in the 9:00 region of the right breast. She has undergone a biopsy. Which revealed invasive ductal carcinoma with associated high-grade DCIS. Tumor is ER positive PR positive HER-2/neu negative with a low Ki-67 of 10%. Patient's case was discussed at the multidisciplinary breast clinic for discussion of treatment options. She is a good breast conservation candidate. We discussed lumpectomy followed by radiation and antiestrogen therapy. Patient is a little bit leery of having pros and cons of skipping radiation when patient has had a lumpectomy. She understands that   #  2 patient and I discussed role of antiestrogen therapy and local and systemic disease recurrence. We also discussed antiestrogen therapy as a prevention for future breast cancer in the contralateral breast. I do think that she would be a good candidate for an aromatase inhibitor such as letrozole or Arimidex or Aromasin. We also discussed use of tamoxifen in her setting as well. We discussed reason for selecting 1 drug over the other. Since ablation is postmenopausal I do think she would be okay to receive an aromatase inhibitor. However I am concerned about her bone health. Her last bone density scan was in 1998. We certainly will need to repeat another one prior to starting her on an aromatase inhibitor. She understands this.  #3 Sjogren's disease patient continues to be seen by her rheumatologist Dr. Nickola Major. Clinical Trial Eligibility: No Multidisciplinary conference discussion yes     PLAN:    #1 patient will proceed with lumpectomy and sentinel lymph node biopsy.  #2 she most likely will need radiation therapy but she will decide this once she has the final pathology and she sees me back.  #3 I will see her in 4-6 weeks' time for followup        Discussion: Patient is being treated per NCCN breast cancer care guidelines appropriate for stage.I   Thank you so much for allowing me to participate in the care of Esma A President. I will continue to follow up the patient with you and assist in her care.  All questions were answered. The patient knows to call the clinic with any problems, questions or concerns. We can certainly see the patient much sooner if necessary.  I spent 55 minutes counseling the patient face to face. The total time spent in the appointment was 60 minutes.  Drue Second, MD Medical/Oncology [facility] [phone] (beeper) [phone] (Office)  01/13/2013, 12:26 PM

## 2013-01-18 ENCOUNTER — Telehealth: Payer: Self-pay | Admitting: *Deleted

## 2013-01-18 NOTE — Telephone Encounter (Signed)
Called and spoke to patient from [facility] 01/13/13.  Patient states she has all of her appts.  No further questions or concerns at this time.  Encouraged patient to call with any concerns or questions.

## 2013-01-26 ENCOUNTER — Encounter (HOSPITAL_BASED_OUTPATIENT_CLINIC_OR_DEPARTMENT_OTHER): Payer: Self-pay | Admitting: *Deleted

## 2013-01-26 NOTE — Progress Notes (Signed)
No labs needed-will go for cxr [facility] orders Does have copd

## 2013-01-27 ENCOUNTER — Ambulatory Visit
Admission: RE | Admit: 2013-01-27 | Discharge: 2013-01-27 | Disposition: A | Discharge status: Home / Self Care | Payer: Medicare Other | Source: Ambulatory Visit | Attending: General Surgery | Admitting: General Surgery

## 2013-01-30 ENCOUNTER — Encounter: Payer: Self-pay | Admitting: *Deleted

## 2013-01-30 NOTE — Progress Notes (Signed)
Mailed after appt letter to pt. 

## 2013-02-01 ENCOUNTER — Ambulatory Visit (HOSPITAL_BASED_OUTPATIENT_CLINIC_OR_DEPARTMENT_OTHER)
Admission: RE | Admit: 2013-02-01 | Discharge: 2013-02-01 | Disposition: A | Discharge status: Home / Self Care | Payer: Medicare Other | Source: Ambulatory Visit | Attending: General Surgery | Admitting: General Surgery

## 2013-02-01 ENCOUNTER — Encounter (HOSPITAL_BASED_OUTPATIENT_CLINIC_OR_DEPARTMENT_OTHER)
Admission: RE | Disposition: A | Discharge status: Home / Self Care | Payer: Self-pay | Source: Ambulatory Visit | Attending: General Surgery

## 2013-02-01 ENCOUNTER — Encounter (HOSPITAL_COMMUNITY)
Admission: RE | Admit: 2013-02-01 | Discharge: 2013-02-01 | Disposition: A | Discharge status: Home / Self Care | Payer: Medicare Other | Source: Ambulatory Visit | Attending: General Surgery | Admitting: General Surgery

## 2013-02-01 ENCOUNTER — Encounter (HOSPITAL_BASED_OUTPATIENT_CLINIC_OR_DEPARTMENT_OTHER): Payer: Self-pay | Admitting: Certified Registered"

## 2013-02-01 ENCOUNTER — Encounter (HOSPITAL_BASED_OUTPATIENT_CLINIC_OR_DEPARTMENT_OTHER): Payer: Self-pay | Admitting: Anesthesiology

## 2013-02-01 ENCOUNTER — Ambulatory Visit (HOSPITAL_BASED_OUTPATIENT_CLINIC_OR_DEPARTMENT_OTHER): Payer: Medicare Other | Admitting: Anesthesiology

## 2013-02-01 DIAGNOSIS — C50919 Malignant neoplasm of unspecified site of unspecified female breast: Secondary | ICD-10-CM | POA: Insufficient documentation

## 2013-02-01 DIAGNOSIS — F411 Generalized anxiety disorder: Secondary | ICD-10-CM | POA: Insufficient documentation

## 2013-02-01 DIAGNOSIS — Z885 Allergy status to narcotic agent status: Secondary | ICD-10-CM | POA: Insufficient documentation

## 2013-02-01 DIAGNOSIS — Z791 Long term (current) use of non-steroidal anti-inflammatories (NSAID): Secondary | ICD-10-CM | POA: Insufficient documentation

## 2013-02-01 DIAGNOSIS — M3500 Sicca syndrome, unspecified: Secondary | ICD-10-CM | POA: Insufficient documentation

## 2013-02-01 DIAGNOSIS — Z882 Allergy status to sulfonamides status: Secondary | ICD-10-CM | POA: Insufficient documentation

## 2013-02-01 DIAGNOSIS — M129 Arthropathy, unspecified: Secondary | ICD-10-CM | POA: Insufficient documentation

## 2013-02-01 DIAGNOSIS — Z780 Asymptomatic menopausal state: Secondary | ICD-10-CM | POA: Insufficient documentation

## 2013-02-01 DIAGNOSIS — Z803 Family history of malignant neoplasm of breast: Secondary | ICD-10-CM | POA: Insufficient documentation

## 2013-02-01 DIAGNOSIS — Z880 Allergy status to penicillin: Secondary | ICD-10-CM | POA: Insufficient documentation

## 2013-02-01 DIAGNOSIS — F17200 Nicotine dependence, unspecified, uncomplicated: Secondary | ICD-10-CM | POA: Insufficient documentation

## 2013-02-01 DIAGNOSIS — Z79899 Other long term (current) drug therapy: Secondary | ICD-10-CM | POA: Insufficient documentation

## 2013-02-01 DIAGNOSIS — D0590 Unspecified type of carcinoma in situ of unspecified breast: Secondary | ICD-10-CM

## 2013-02-01 DIAGNOSIS — Z170 Estrogen receptor positive status [ER+]: Secondary | ICD-10-CM | POA: Insufficient documentation

## 2013-02-01 DIAGNOSIS — C50911 Malignant neoplasm of unspecified site of right female breast: Secondary | ICD-10-CM

## 2013-02-01 HISTORY — DX: Chronic obstructive pulmonary disease, unspecified: J44.9

## 2013-02-01 HISTORY — DX: Cough: R05

## 2013-02-01 HISTORY — DX: Cough, unspecified: R05.9

## 2013-02-01 HISTORY — PX: BREAST LUMPECTOMY WITH NEEDLE LOCALIZATION AND AXILLARY SENTINEL LYMPH NODE BX: SHX5760

## 2013-02-01 HISTORY — DX: Shortness of breath: R06.02

## 2013-02-01 SURGERY — BREAST LUMPECTOMY WITH NEEDLE LOCALIZATION AND AXILLARY SENTINEL LYMPH NODE BX
Anesthesia: General | Site: Breast | Laterality: Right | Wound class: Clean

## 2013-02-01 MED ORDER — TRAMADOL HCL 50 MG PO TABS: 50.0000 mg | ORAL_TABLET | Freq: Four times a day (QID) | ORAL | Status: DC | PRN

## 2013-02-01 MED ORDER — DEXAMETHASONE SODIUM PHOSPHATE 4 MG/ML IJ SOLN
INTRAMUSCULAR | Status: DC | PRN
  Administered 2013-02-01: 8 mg via INTRAVENOUS

## 2013-02-01 MED ORDER — FENTANYL CITRATE 0.05 MG/ML IJ SOLN
INTRAMUSCULAR | Status: DC | PRN
  Administered 2013-02-01 (×3): 25 ug via INTRAVENOUS

## 2013-02-01 MED ORDER — LACTATED RINGERS IV SOLN
INTRAVENOUS | Status: DC
  Administered 2013-02-01 (×2): via INTRAVENOUS

## 2013-02-01 MED ORDER — FENTANYL CITRATE 0.05 MG/ML IJ SOLN
50.0000 ug | Freq: Once | INTRAMUSCULAR | Status: AC
  Administered 2013-02-01: 100 ug via INTRAVENOUS

## 2013-02-01 MED ORDER — ONDANSETRON HCL 4 MG/2ML IJ SOLN
INTRAMUSCULAR | Status: DC | PRN
  Administered 2013-02-01: 4 mg via INTRAVENOUS

## 2013-02-01 MED ORDER — EPHEDRINE SULFATE 50 MG/ML IJ SOLN
INTRAMUSCULAR | Status: DC | PRN
  Administered 2013-02-01: 10 mg via INTRAVENOUS

## 2013-02-01 MED ORDER — TECHNETIUM TC 99M SULFUR COLLOID FILTERED
0.5000 | Freq: Once | INTRAVENOUS | Status: AC | PRN
  Administered 2013-02-01: 0.5 via INTRADERMAL

## 2013-02-01 MED ORDER — MIDAZOLAM HCL 2 MG/2ML IJ SOLN
1.0000 mg | INTRAMUSCULAR | Status: DC | PRN
  Administered 2013-02-01: 1 mg via INTRAVENOUS

## 2013-02-01 MED ORDER — BUPIVACAINE HCL (PF) 0.5 % IJ SOLN
INTRAMUSCULAR | Status: DC | PRN
  Administered 2013-02-01: 15 mL

## 2013-02-01 MED ORDER — PROPOFOL 10 MG/ML IV BOLUS
INTRAVENOUS | Status: DC | PRN
  Administered 2013-02-01: 150 mg via INTRAVENOUS

## 2013-02-01 MED ORDER — OXYCODONE HCL 5 MG PO TABS: 5.0000 mg | ORAL_TABLET | Freq: Once | ORAL | Status: DC | PRN

## 2013-02-01 MED ORDER — LIDOCAINE HCL (CARDIAC) 20 MG/ML IV SOLN
INTRAVENOUS | Status: DC | PRN
  Administered 2013-02-01: 65 mg via INTRAVENOUS

## 2013-02-01 MED ORDER — ONDANSETRON HCL 4 MG/2ML IJ SOLN: 4.0000 mg | Freq: Four times a day (QID) | INTRAMUSCULAR | Status: DC | PRN

## 2013-02-01 MED ORDER — HYDROMORPHONE HCL PF 1 MG/ML IJ SOLN
0.2500 mg | INTRAMUSCULAR | Status: DC | PRN
  Administered 2013-02-01 (×4): 0.5 mg via INTRAVENOUS

## 2013-02-01 MED ORDER — VANCOMYCIN HCL IN DEXTROSE 1-5 GM/200ML-% IV SOLN
1000.0000 mg | INTRAVENOUS | Status: AC
  Administered 2013-02-01: 1000 mg via INTRAVENOUS

## 2013-02-01 MED ORDER — OXYCODONE HCL 5 MG/5ML PO SOLN: 5.0000 mg | Freq: Once | ORAL | Status: DC | PRN

## 2013-02-01 SURGICAL SUPPLY — 57 items
APL SKNCLS STERI-STRIP NONHPOA (GAUZE/BANDAGES/DRESSINGS) ×1
APPLIER CLIP 11 MED OPEN (CLIP)
APR CLP MED 11 20 MLT OPN (CLIP)
BANDAGE ELASTIC 6 VELCRO ST LF (GAUZE/BANDAGES/DRESSINGS) IMPLANT
BENZOIN TINCTURE PRP APPL 2/3 (GAUZE/BANDAGES/DRESSINGS) ×2 IMPLANT
BLADE SURG 10 STRL SS (BLADE) IMPLANT
BLADE SURG 15 STRL LF DISP TIS (BLADE) ×2 IMPLANT
BLADE SURG 15 STRL SS (BLADE) ×4
CANISTER SUCTION 1200CC (MISCELLANEOUS) ×2 IMPLANT
CHLORAPREP W/TINT 26ML (MISCELLANEOUS) ×2 IMPLANT
CLIP APPLIE 11 MED OPEN (CLIP) IMPLANT
CLOTH BEACON ORANGE TIMEOUT ST (SAFETY) ×2 IMPLANT
COVER MAYO STAND STRL (DRAPES) ×2 IMPLANT
COVER PROBE W GEL 5X96 (DRAPES) ×2 IMPLANT
COVER TABLE BACK 60X90 (DRAPES) ×2 IMPLANT
DECANTER SPIKE VIAL GLASS SM (MISCELLANEOUS) IMPLANT
DEVICE DUBIN W/COMP PLATE 8390 (MISCELLANEOUS) ×1 IMPLANT
DRAIN CHANNEL 19F RND (DRAIN) IMPLANT
DRAPE LAPAROSCOPIC ABDOMINAL (DRAPES) ×2 IMPLANT
DRAPE UTILITY XL STRL (DRAPES) ×2 IMPLANT
ELECT COATED BLADE 2.86 ST (ELECTRODE) ×2 IMPLANT
ELECT REM PT RETURN 9FT ADLT (ELECTROSURGICAL) ×2
ELECTRODE REM PT RTRN 9FT ADLT (ELECTROSURGICAL) ×1 IMPLANT
EVACUATOR SILICONE 100CC (DRAIN) IMPLANT
GLOVE BIOGEL M 7.0 STRL (GLOVE) ×1 IMPLANT
GLOVE BIOGEL PI IND STRL 7.5 (GLOVE) IMPLANT
GLOVE BIOGEL PI IND STRL 8.5 (GLOVE) ×1 IMPLANT
GLOVE BIOGEL PI INDICATOR 7.5 (GLOVE) ×1
GLOVE BIOGEL PI INDICATOR 8.5 (GLOVE) ×1
GLOVE ECLIPSE 8.0 STRL XLNG CF (GLOVE) ×2 IMPLANT
GLOVE EXAM NITRILE LRG STRL (GLOVE) ×1 IMPLANT
GOWN PREVENTION PLUS XLARGE (GOWN DISPOSABLE) ×2 IMPLANT
NDL HYPO 25X1 1.5 SAFETY (NEEDLE) ×1 IMPLANT
NDL SAFETY ECLIPSE 18X1.5 (NEEDLE) ×1 IMPLANT
NEEDLE HYPO 18GX1.5 SHARP (NEEDLE) ×2
NEEDLE HYPO 25X1 1.5 SAFETY (NEEDLE) ×2 IMPLANT
NS IRRIG 1000ML POUR BTL (IV SOLUTION) ×2 IMPLANT
PACK BASIN DAY SURGERY FS (CUSTOM PROCEDURE TRAY) ×2 IMPLANT
PENCIL BUTTON HOLSTER BLD 10FT (ELECTRODE) ×2 IMPLANT
PIN SAFETY STERILE (MISCELLANEOUS) IMPLANT
SLEEVE SCD COMPRESS KNEE MED (MISCELLANEOUS) ×2 IMPLANT
SPONGE GAUZE 4X4 12PLY (GAUZE/BANDAGES/DRESSINGS) ×2 IMPLANT
SPONGE LAP 18X18 X RAY DECT (DISPOSABLE) IMPLANT
SPONGE LAP 4X18 X RAY DECT (DISPOSABLE) ×2 IMPLANT
STAPLER VISISTAT 35W (STAPLE) IMPLANT
STRIP CLOSURE SKIN 1/2X4 (GAUZE/BANDAGES/DRESSINGS) ×2 IMPLANT
SUT ETHILON 3 0 FSL (SUTURE) IMPLANT
SUT MNCRL AB 4-0 PS2 18 (SUTURE) ×4 IMPLANT
SUT SILK 2 0 SH (SUTURE) ×1 IMPLANT
SUT VIC AB 3-0 SH 27 (SUTURE)
SUT VIC AB 3-0 SH 27X BRD (SUTURE) IMPLANT
SUT VICRYL 3-0 CR8 SH (SUTURE) ×2 IMPLANT
SYR CONTROL 10ML LL (SYRINGE) ×2 IMPLANT
TOWEL OR 17X24 6PK STRL BLUE (TOWEL DISPOSABLE) ×3 IMPLANT
TOWEL OR NON WOVEN STRL DISP B (DISPOSABLE) ×2 IMPLANT
TUBE CONNECTING 20X1/4 (TUBING) ×2 IMPLANT
YANKAUER SUCT BULB TIP NO VENT (SUCTIONS) ×2 IMPLANT

## 2013-02-01 NOTE — Interval H&P Note (Signed)
History and Physical Interval Note:  02/01/2013 12:42 PM  Michelle Gonzales  has presented today for surgery, with the diagnosis of invasive right breast cancer  The various methods of treatment have been discussed with the patient and family. After consideration of risks, benefits and other options for treatment, the patient has consented to  Procedure(s): BREAST LUMPECTOMY WITH NEEDLE LOCALIZATION AND AXILLARY SENTINEL LYMPH NODE BX (Right) as a surgical intervention .  The patient's history has been reviewed, patient examined, no change in status, stable for surgery.  I have reviewed the patient's chart and labs.  Questions were answered to the patient's satisfaction.     Laporsche Hoeger [facility]

## 2013-02-01 NOTE — Anesthesia Postprocedure Evaluation (Signed)
Anesthesia Post Note  Patient: Michelle Gonzales  Procedure(s) Performed: Procedure(s) (LRB): BREAST LUMPECTOMY WITH NEEDLE LOCALIZATION AND AXILLARY SENTINEL LYMPH NODE BX (Right)  Anesthesia type: General  Patient location: PACU  Post pain: Pain level controlled and Adequate analgesia  Post assessment: Post-op Vital signs reviewed, Patient's Cardiovascular Status Stable, Respiratory Function Stable, Patent Airway and Pain level controlled  Last Vitals:  Filed Vitals:   02/01/13 1506  BP:   Pulse: 92  Temp:   Resp: 16    Post vital signs: Reviewed and stable  Level of consciousness: awake, alert  and oriented  Complications: No apparent anesthesia complications

## 2013-02-01 NOTE — Op Note (Signed)
Operative Note  MIHIRA TOZZI female 72 y.o. 02/01/2013  PREOPERATIVE DX:  Right Breast Cancer  POSTOPERATIVE DX:  Same  PROCEDURE:  Right axillary lymphadenopathy. Right axillary sentinel lymph node biopsy (1 node). Right partial mastectomy after wire localization.         Surgeon: Adolph Pollack   Assistants: none  Anesthesia: General LMA anesthesia  Indications: This is a 72 year old female noted to have an abnormality on the right mammogram at the 9:00 position. Image guided biopsy was consistent with invasive breast cancer.  She is a good candidate for breast conservation surgery and presents for that.    Procedure Detail:  She has successful wire localization to breast imaging Center. She then came the holding area and an injection of radioactive material in the right breast. The right breast was marked with my initials. She is brought to the operating room, laid supine on the operating table, and general anesthetic was given.  Using the neoprobe, right axillary lymphatic mapping was performed and an area of increased counts noted in the lower axilla.  The right breast and axillary areas were sterilely prepped and draped.  A transverse incision is made in the inferior right axillary area and carried down to the subcutaneous tissues. Using the neoprobe an area of increased counts was noted and further dissection demonstrated a slightly enlarged lymph node with increased counts. This was removed with electrocautery. Counts were above 1000. This was sentinel lymph node #1. Further inspection of the axilla with the neoprobe and by visual inspection did not demonstrate any other significant lymph nodes were increased counts. The lymph node was sent to pathology.  The wound was then anesthetized with half percent plain Marcaine solution. Hemostasis was adequate. The subcutaneous tissue the wound was approximated with interrupted 3-0 Vicryl sutures. The skin is closed with a running 4-0  Monocryl subcuticular stitch.  Next, the right breast was approached. In the lateral aspect a curvilinear incision was made through the skin and subcutaneous tissue. The wire was brought into the wound.  Using electrocautery, a lumpectomy was performed around the wire to include the tip of the wire. Once the specimen was removed, the anterior aspect was then marked with a single suture and the medial aspect marked with a double suture. The specimen mammogram was performed and the area of concern was in the middle of the specimen which indicated that margins appeared to be adequate.  The wound was inspected and hemostasis was adequate. The specimen was sent to pathology. The subcutaneous tissue was approximated with 3-0 Vicryl suture. Local anesthetic was infiltrated into the wound. The skin was closed with a 4-0 Monocryl subcuticular stitch. Steri-Strips and sterile dressings were then applied to both wounds followed by a breast binder.  She tolerated the procedures well without any apparent complications and was taken to the recovery room in satisfactory condition.  Estimated Blood Loss:  less than 100 mL         Drains: none  Blood Given: none          Specimens: Right axillary sentinel lymph node. Right breast tissue.        Complications:  * No complications entered in OR log *         Disposition: PACU - hemodynamically stable.         Condition: stable

## 2013-02-01 NOTE — H&P (View-Only) (Signed)
Patient ID: Michelle Gonzales, female   DOB: 1941-02-20, 72 y.o.   MRN: [id number]  No chief complaint on file.   HPI Michelle Gonzales is a 72 y.o. female.   HPI  She is referred by Dr. Latricia Heft for further evaluation and treatment of newly diagnosed invasive right breast cancer. She is noted to have a right breast nodule on screening mammogram. This was 4 mm by ultrasound at the 9:00 position. She underwent image guided biopsy. It demonstrated invasive ductal carcinoma. This was grade 2. ER/PR positive. HER-2 negative. Proliferation rate is 10%. MRI demonstrated an 8 mm lesion at that site.  Her mother had breast cancer at age 22. Menarche was at age 18. First live childbirth was at age 68. She is menopausal and has been so for approximately 30 years. No hormone replacement therapy. No masses in her breast. No breast pain. No skin changes. No nipple discharge. She had a previous biopsy of right breast masses that were benign.  She is here with her husband.  Past Medical History  Diagnosis Date  . Hot flashes   . Arthritis   . Anxiety     Sjogren's disease.  Past Surgical History  Procedure Laterality Date  . Abdominal hysterectomy      partial  . Cholecystectomy      Right breast biopsy  Family History  Problem Relation Age of Onset  . Breast cancer Mother     Social History History  Substance Use Topics  . Smoking status: Current Every Day Smoker -- 0.50 packs/day    Types: Cigarettes  . Smokeless tobacco: Not on file  . Alcohol Use: No    Allergies  Allergen Reactions  . Penicillins Anaphylaxis  . Codeine Other (See Comments)    Massive headache  . Sulfa Antibiotics Itching and Rash    Current Outpatient Prescriptions  Medication Sig Dispense Refill  . azelastine (ASTELIN) 137 MCG/SPRAY nasal spray Place 1 spray into the nose 2 (two) times daily. Use in each nostril as directed      . beta carotene w/minerals (OCUVITE) tablet Take 1 tablet by mouth daily.      .  cholecalciferol (VITAMIN D) 1000 UNITS tablet Take 1,000 Units by mouth daily.      . Fluticasone-Salmeterol (ADVAIR) 100-50 MCG/DOSE AEPB Inhale 2 puffs into the lungs every morning.      . Misc Natural Products (OSTEO BI-FLEX JOINT SHIELD PO) Take by mouth daily.      . Multiple Vitamins-Minerals (CENTRUM PO) Take by mouth daily.      . naproxen (NAPROSYN) 250 MG tablet Take 50 mg by mouth daily.      . paroxetine mesylate (PEXEVA) 40 MG tablet Take 40 mg by mouth every morning. Takes 1/2 at night      . vitamin C (ASCORBIC ACID) 250 MG tablet Take 250 mg by mouth daily.       No current facility-administered medications for this visit.    Review of Systems Review of Systems  Constitutional: Positive for fatigue.  HENT: Negative.   Respiratory: Positive for cough.   Cardiovascular: Positive for palpitations.  Genitourinary:       Some dribbling with urination  Musculoskeletal: Positive for arthralgias.  Neurological:       "forgetfulness"  Hematological: Bruises/bleeds easily.    There were no vitals taken for this visit.  Physical Exam Physical Exam  Constitutional: She appears well-developed and well-nourished. No distress.  HENT:  Head: Normocephalic and atraumatic.  Eyes:  No scleral icterus.  Neck: Neck supple.  No supraclavicular adenopathy  Cardiovascular: Normal rate and regular rhythm.   Pulmonary/Chest: Effort normal and breath sounds normal.  Breasts are symmetrical. Circumareolar scar in right breast. No dominant masses in either breast. No suspicious skin changes. No nipple discharge.  Abdominal: Soft. She exhibits no distension and no mass. There is no tenderness.  Musculoskeletal: She exhibits no edema.  Lymphadenopathy:    She has no cervical adenopathy.  Neurological: She is alert.  Skin: Skin is warm and dry.  Psychiatric: She has a normal mood and affect. Her behavior is normal.    Data Reviewed Mammogram, ultrasound, MRI, pathology report,  labs  Assessment    Delayed diagnosis invasive right breast cancer. She is a good candidate for breast conservation and is interested in this. She is also a smoker and I have asked her to stop before the surgery or cut down as much as possible to decrease the complication rate of wound healing.     Plan    Right breast lumpectomy after wire localization and right axillary sentinel lymph node biopsy.  I have explained the procedure, risks, and aftercare.  The risks include but are not limited to bleeding, infection, wound problems, seroma formation, anesthesia, nerve injury, lymphedema, need for reexcision or removal of more lymph nodes at a later time.  She seems to understand and agrees with the plan.        Bevin Das J 01/13/2013, 11:58 AM

## 2013-02-01 NOTE — Progress Notes (Signed)
Nuclear med injection done by radiology staff. IV fentanyl and versed given (see MAR), Pt tol well

## 2013-02-01 NOTE — Transfer of Care (Signed)
Immediate Anesthesia Transfer of Care Note  Patient: Michelle Gonzales  Procedure(s) Performed: Procedure(s): BREAST LUMPECTOMY WITH NEEDLE LOCALIZATION AND AXILLARY SENTINEL LYMPH NODE BX (Right)  Patient Location: PACU  Anesthesia Type:General  Level of Consciousness: sedated and unresponsive  Airway & Oxygen Therapy: Patient Spontanous Breathing and Patient connected to face mask oxygen  Post-op Assessment: Report given to PACU RN and Post -op Vital signs reviewed and stable  Post vital signs: Reviewed and stable  Complications: No apparent anesthesia complications

## 2013-02-01 NOTE — Anesthesia Procedure Notes (Signed)
Procedure Name: LMA Insertion Date/Time: 02/01/2013 1:06 PM Performed by: Verlan Friends Pre-anesthesia Checklist: Patient identified, Emergency Drugs available, Suction available, Patient being monitored and Timeout performed Patient Re-evaluated:Patient Re-evaluated prior to inductionOxygen Delivery Method: Circle System Utilized Preoxygenation: Pre-oxygenation with 100% oxygen Intubation Type: IV induction Ventilation: Mask ventilation without difficulty LMA: LMA inserted LMA Size: 4.0 Number of attempts: 1 Airway Equipment and Method: bite block Placement Confirmation: positive ETCO2 Tube secured with: Tape Dental Injury: Teeth and Oropharynx as per pre-operative assessment

## 2013-02-01 NOTE — Anesthesia Preprocedure Evaluation (Signed)
Anesthesia Evaluation  Patient identified by MRN, date of birth, ID band Patient awake    Reviewed: Allergy & Precautions, H&P , NPO status , Patient's Chart, lab work & pertinent test results  Airway Mallampati: II  Neck ROM: full    Dental   Pulmonary shortness of breath, COPDCurrent Smoker,          Cardiovascular     Neuro/Psych Anxiety    GI/Hepatic   Endo/Other    Renal/GU      Musculoskeletal   Abdominal   Peds  Hematology   Anesthesia Other Findings   Reproductive/Obstetrics                           Anesthesia Physical Anesthesia Plan  ASA: III  Anesthesia Plan: General   Post-op Pain Management:    Induction: Intravenous  Airway Management Planned: LMA  Additional Equipment:   Intra-op Plan:   Post-operative Plan: Extubation in OR  Informed Consent: I have reviewed the patients History and Physical, chart, labs and discussed the procedure including the risks, benefits and alternatives for the proposed anesthesia with the patient or authorized representative who has indicated his/her understanding and acceptance.     Plan Discussed with: CRNA, Anesthesiologist and Surgeon  Anesthesia Plan Comments:         Anesthesia Quick Evaluation

## 2013-02-02 ENCOUNTER — Encounter (HOSPITAL_BASED_OUTPATIENT_CLINIC_OR_DEPARTMENT_OTHER): Payer: Self-pay | Admitting: General Surgery

## 2013-02-04 ENCOUNTER — Encounter (INDEPENDENT_AMBULATORY_CARE_PROVIDER_SITE_OTHER): Payer: Self-pay | Admitting: General Surgery

## 2013-02-04 NOTE — Progress Notes (Signed)
Patient ID: Michelle Gonzales, female   DOB: 12/17/1940, 72 y.o.   MRN: [id number] Pathology demonstrates a negative sentinel lymph node. Margins are clear on the lumpectomy. This was discussed with her.

## 2013-02-15 ENCOUNTER — Ambulatory Visit: Payer: PRIVATE HEALTH INSURANCE | Admitting: Oncology

## 2013-02-22 ENCOUNTER — Encounter (HOSPITAL_COMMUNITY): Payer: Self-pay | Admitting: Anatomic Pathology & Clinical Pathology

## 2013-02-23 ENCOUNTER — Encounter (INDEPENDENT_AMBULATORY_CARE_PROVIDER_SITE_OTHER): Payer: Self-pay | Admitting: General Surgery

## 2013-02-23 ENCOUNTER — Ambulatory Visit (INDEPENDENT_AMBULATORY_CARE_PROVIDER_SITE_OTHER): Payer: PRIVATE HEALTH INSURANCE | Admitting: General Surgery

## 2013-02-23 VITALS — BP 130/78 | HR 68 | Temp 97.9°F | Resp 15 | Ht 60.0 in | Wt 163.0 lb

## 2013-02-23 DIAGNOSIS — Z853 Personal history of malignant neoplasm of breast: Secondary | ICD-10-CM

## 2013-02-23 NOTE — Progress Notes (Signed)
Procedure:  Right partial mastectomy and right axillary sentinel lymph node biopsy  Date:  02/01/2013  Pathology:  T1aN0  History:  She is here for first postoperative visit.  She is aware of her pathology results. She has some soreness around the right breast incision.  Exam: General- Is in NAD. Right Breast-Incision is clean and intact. Steri-Strips are still present. Right axillary incision is clean and intact.  Assessment:  T1aN0 Right breast cancer status post right partial mastectomy and sentinel lymph node biopsy. She is healing well.  Plan:  Resume normal activities as tolerated. Return visit 6 weeks. If she decides to have radiation therapy, would want to see her in 3 months instead of 6 weeks.

## 2013-02-23 NOTE — Patient Instructions (Signed)
Activities as tolerated with right arm as we discussed.

## 2013-02-24 ENCOUNTER — Ambulatory Visit (HOSPITAL_BASED_OUTPATIENT_CLINIC_OR_DEPARTMENT_OTHER): Payer: Medicare Other | Admitting: Oncology

## 2013-02-24 ENCOUNTER — Telehealth: Payer: Self-pay | Admitting: Oncology

## 2013-02-24 ENCOUNTER — Encounter: Payer: Self-pay | Admitting: *Deleted

## 2013-02-24 VITALS — BP 138/80 | HR 90 | Temp 98.7°F | Resp 20 | Ht 60.0 in | Wt 163.1 lb

## 2013-02-24 DIAGNOSIS — C50419 Malignant neoplasm of upper-outer quadrant of unspecified female breast: Secondary | ICD-10-CM

## 2013-02-24 DIAGNOSIS — M8580 Other specified disorders of bone density and structure, unspecified site: Secondary | ICD-10-CM

## 2013-02-24 DIAGNOSIS — J449 Chronic obstructive pulmonary disease, unspecified: Secondary | ICD-10-CM

## 2013-02-24 MED ORDER — EXEMESTANE 25 MG PO TABS: 25.0000 mg | ORAL_TABLET | Freq: Every day | ORAL | Status: DC

## 2013-02-24 NOTE — Progress Notes (Signed)
Location of Breast Cancer:Right   Histology per Pathology Report: 01/06/13 AV:[id number], right, needle core biopsy, mass, 9 o'clock - INVASIVE DUCTAL CARCINOMA. DUCTAL CARCINOMA IN SITU.  02/01/13: Diagnosis1. Lymph node, sentinel, biopsy, Right axillary- THERE IS NO EVIDENCE OF CARCINOMA IN 1 OF 1 LYMPH NODE (0/1).2. Breast, lumpectomy, Right - INVASIVE DUCTAL CARCINOMA, GRADE I/III, SPANNING 0.4 CM.- DUCTAL CARCINOMA IN SITU, LOW GRADE.- LOBULAR NEOPLASIA (ATYPICAL LOBULAR HYPERPLASIA).- THE SURGICAL RESECTION MARGINS ARE NEGATIVE FOR CARCINOMA Receptor Status: ER(+), PR (+), Her2-neu (-)  Did patient present with symptoms (if so, please note symptoms) or was this found on screening mammography?: found on screening mammogram ,   Past/Anticipated interventions by surgeon,   02/01/13: Diagnosis1. Lymph node, sentinel, biopsy, Right axillary- THERE IS NO EVIDENCE OF CARCINOMA IN 1 OF 1 LYMPH NODE (0/1).2. Breast, lumpectomy, Right - INVASIVE DUCTAL CARCINOMA, GRADE I/III, SPANNING 0.4 CM.- DUCTAL CARCINOMA IN SITU, LOW GRADE.- LOBULAR NEOPLASIA (ATYPICAL LOBULAR HYPERPLASIA).- THE SURGICAL RESECTION MARGINS ARE NEGATIVE FOR CARCINOMA F/u appt 02/23/13 Dr. Avel Peace ,return in 6 weeks unless decision to start radiation therapy ,then would see her in 3 months instead of 6 weeks  Past/Anticipated interventions by medical oncology, if any: Chemotherapy :none, Dr. Welton Flakes seen 02/23/13, next visit November , no note dictated as yet,Aromasyn 25mg  rx filled yesterday, not taking as yet Lymphedema issues, if any:  NO  Pain issues, if any:  Soreness around incision site right breast SAFETY ISSUES:  Prior radiation? no  Pacemaker/ICD? no  Possible current pregnancy?no  Is the patient on methotrexate? no,   Current Complaints / other details: FUNC from note 01/13/13, mother breast cancer age 25,   Smokes 6-7 cigarettes daily smoked since age 35, used to smoke 1-2ppd,  Mother had  Right breast cancer,/surgery  only, died age 104,  maternal aunt Lymph gland cancer started in her neck metastatic  died age 75  Katherene Ponto, Gloriann Loan, RN 02/24/2013,11:54 AM

## 2013-02-24 NOTE — Patient Instructions (Addendum)
We discussed your pathology  We discussed adjuvant treatment with anti-estrogen therapy for the breast cancer with aromasin, we discussed the benefits and side effects  Please keep your appointment with Dr. Michell Heinrich for tomorrow  Exemestane tablets What is this medicine? EXEMESTANE (ex e MES tane) blocks the production of the hormone estrogen. Some types of breast cancer depend on estrogen to grow, and this medicine can stop tumor growth by blocking estrogen production. This medicine is for the treatment of breast cancer in postmenopausal women only. This medicine may be used for other purposes; ask your health care provider or pharmacist if you have questions. What should I tell my health care provider before I take this medicine? They need to know if you have any of these conditions: -an unusual or allergic reaction to exemestane, other medicines, foods, dyes, or preservatives -pregnant or trying to get pregnant -breast-feeding How should I use this medicine? Take this medicine by mouth with a glass of water. Follow the directions on the prescription label. Take your doses at regular intervals after a meal. Do not take your medicine more often than directed. Do not stop taking except on the advice of your doctor or health care professional. Contact your pediatrician regarding the use of this medicine in children. Special care may be needed. Overdosage: If you think you have taken too much of this medicine contact a poison control center or emergency room at once. NOTE: This medicine is only for you. Do not share this medicine with others. What if I miss a dose? If you miss a dose, take the next dose as usual. Do not try to make up the missed dose. Do not take double or extra doses. What may interact with this medicine? Do not take this medicine with any of the following medications: -female hormones, like estrogens and birth control pills This medicine may also interact with the following  medications: -androstenedione -phenytoin -rifabutin, rifampin, or rifapentine -St. John's Wort This list may not describe all possible interactions. Give your health care provider a list of all the medicines, herbs, non-prescription drugs, or dietary supplements you use. Also tell them if you smoke, drink alcohol, or use illegal drugs. Some items may interact with your medicine. What should I watch for while using this medicine? Visit your doctor or health care professional for regular checks on your progress. If you experience hot flashes or sweating while taking this medicine, avoid alcohol, smoking and drinks with caffeine. This may help to decrease these side effects. What side effects may I notice from receiving this medicine? Side effects that you should report to your doctor or health care professional as soon as possible: -any new or unusual symptoms -changes in vision -fever -leg or arm swelling -pain in bones, joints, or muscles -pain in hips, back, ribs, arms, shoulders, or legs Side effects that usually do not require medical attention (report to your doctor or health care professional if they continue or are bothersome): -difficulty sleeping -headache -hot flashes -sweating -unusually weak or tired This list may not describe all possible side effects. Call your doctor for medical advice about side effects. You may report side effects to FDA at 1-800-FDA-1088. Where should I keep my medicine? Keep out of the reach of children. Store at room temperature between 15 and 30 degrees C (59 and 86 degrees F). Throw away any unused medicine after the expiration date. NOTE: This sheet is a summary. It may not cover all possible information. If you have questions about  this medicine, talk to your doctor, pharmacist, or health care provider.  2013, Elsevier/Gold Standard. (11/10/2007 11:48:29 AM)

## 2013-02-25 ENCOUNTER — Ambulatory Visit
Admission: RE | Admit: 2013-02-25 | Discharge: 2013-02-25 | Disposition: A | Discharge status: Home / Self Care | Payer: Medicare Other | Source: Ambulatory Visit | Attending: Radiation Oncology | Admitting: Radiation Oncology

## 2013-02-25 ENCOUNTER — Encounter: Payer: Self-pay | Admitting: Radiation Oncology

## 2013-02-25 VITALS — BP 120/70 | HR 80 | Temp 98.5°F | Resp 20 | Ht 60.0 in | Wt 163.2 lb

## 2013-02-25 DIAGNOSIS — C50411 Malignant neoplasm of upper-outer quadrant of right female breast: Secondary | ICD-10-CM

## 2013-02-25 DIAGNOSIS — T7840XA Allergy, unspecified, initial encounter: Secondary | ICD-10-CM | POA: Insufficient documentation

## 2013-02-25 DIAGNOSIS — C50919 Malignant neoplasm of unspecified site of unspecified female breast: Secondary | ICD-10-CM | POA: Insufficient documentation

## 2013-02-25 DIAGNOSIS — Z170 Estrogen receptor positive status [ER+]: Secondary | ICD-10-CM | POA: Insufficient documentation

## 2013-02-25 DIAGNOSIS — F17200 Nicotine dependence, unspecified, uncomplicated: Secondary | ICD-10-CM | POA: Insufficient documentation

## 2013-02-25 HISTORY — DX: Allergy, unspecified, initial encounter: T78.40XA

## 2013-02-25 HISTORY — DX: Major depressive disorder, single episode, unspecified: F32.9

## 2013-02-25 HISTORY — DX: Depression, unspecified: F32.A

## 2013-02-25 HISTORY — DX: Unspecified cataract: H26.9

## 2013-02-25 NOTE — Progress Notes (Signed)
Department of Radiation Oncology  Phone:  [phone] Fax:        [phone]   Name: Michelle Gonzales MRN: [id number]  DOB: Dec 30, 1940  Date: 02/25/2013  Follow Up Visit Note  Diagnosis: T1 A. N0 right breast cancer  Interval History: Michelle Gonzales presents today for routine followup.  She underwent a lumpectomy on 02/01/2013. This revealed a 0.4 cm area of invasive cancer which was grade 1. This was associated with low-grade DCIS as well as atypical lobular hyperplasia. A single sentinel lymph node was negative for malignancy. The closest margin was 0.6 cm. Her tumor was 100% estrogen positive at 80% progesterone positive. She was HER-2 negative. Ki-67 was 15%. She met with Dr. Welton Flakes yesterday discuss antiestrogen therapy. She continues to smoke. She is healing up well. She still has some irritation in her sentinel lymph node incision on the right.  Allergies:  Allergies  Allergen Reactions  . Penicillins Anaphylaxis  . Codeine Other (See Comments)    Massive headache  . Sulfa Antibiotics Itching and Rash    Medications:  Current Outpatient Prescriptions  Medication Sig Dispense Refill  . albuterol (PROVENTIL HFA;VENTOLIN HFA) 108 (90 BASE) MCG/ACT inhaler Inhale 2 puffs into the lungs every 6 (six) hours as needed for wheezing.      Marland Kitchen azelastine (ASTELIN) 137 MCG/SPRAY nasal spray Place 1 spray into the nose 2 (two) times daily. Use in each nostril as directed      . beta carotene w/minerals (OCUVITE) tablet Take 1 tablet by mouth daily.      . calcium carbonate 200 MG capsule Take 250 mg by mouth daily. Coral calcium 100%      . cholecalciferol (VITAMIN D) 1000 UNITS tablet Take 1,000 Units by mouth daily.      Marland Kitchen exemestane (AROMASIN) 25 MG tablet Take 1 tablet (25 mg total) by mouth daily after breakfast.  90 tablet  12  . Fluticasone-Salmeterol (ADVAIR) 100-50 MCG/DOSE AEPB Inhale 2 puffs into the lungs every morning.      . magnesium 30 MG tablet Take 30 mg by mouth daily.      .  Misc Natural Products (OSTEO BI-FLEX JOINT SHIELD PO) Take by mouth daily.      . montelukast (SINGULAIR) 10 MG tablet Take 10 mg by mouth at bedtime.       . Multiple Vitamins-Minerals (CENTRUM PO) Take by mouth daily.      . naproxen (NAPROSYN) 250 MG tablet Take 50 mg by mouth daily.      Marland Kitchen PARoxetine (PAXIL) 40 MG tablet Take 40 mg by mouth daily.       . paroxetine mesylate (PEXEVA) 40 MG tablet Take 40 mg by mouth every morning. Takes 1/2 at night      . traMADol (ULTRAM) 50 MG tablet Take 1-2 tablets (50-100 mg total) by mouth every 6 (six) hours as needed for pain.  30 tablet  1  . vitamin C (ASCORBIC ACID) 250 MG tablet Take 250 mg by mouth daily.       No current facility-administered medications for this encounter.    Physical Exam:  Filed Vitals:   02/25/13 1036  BP: 120/70  Pulse: 80  Temp: 98.5 F (36.9 C)  Resp: 20   she is a pleasant female who appears her stated age sitting comfortably examining table. She has a lateral lumpectomy incision with Steri-Strips in place which is healing well. Her sentinel node incision looks good with no overt erythema or signs of infection.  IMPRESSION: Esta is a 72 y.o. female T1 A. N0 invasive carcinoma of the right breast ER positive and a 27-year-old patient  PLAN:  I spoke with Michelle Gonzales about the role of radiation in decreasing local failures in patients who undergo breast conservation. We discussed the process of simulation the placement tattoos. We discussed 4-6 weeks of treatment as an outpatient. We discussed the randomized trial showing no survival benefit of radiation plus antiestrogen therapy versus antiestrogen therapy alone. At this point her smoking plus radiation will progress significantly increased risk for lung cancer. I think she would be better served by taking an aromatase inhibitor as she is not interested in smoking cessation at this time. We discussed the local control rates were slightly lower with radiation and an  antiestrogen pill but not at translates into a survival benefit. She was comfortable to decision to pursue antiestrogen therapy alone. She has appointment Dr. Welton Flakes in November. She is going to call her surgeons office and get a followup appointment there. We discussed monitoring with annual mammograms.    Michelle Hare, MD

## 2013-02-25 NOTE — Progress Notes (Signed)
Please see the Nurse Progress Note in the MD Initial Consult Encounter for this patient. 

## 2013-03-03 NOTE — Addendum Note (Signed)
Encounter addended by: Lowella Petties, RN on: 03/03/2013  7:49 AM<BR>     Documentation filed: Charges VN

## 2013-03-14 NOTE — Progress Notes (Signed)
OFFICE PROGRESS NOTE  CC**  Michelle Blamer, MD [facility] [address] [place] [address] Dr. Lurline Hare  Dr. Avel Peace  DIAGNOSIS: 72 year old female with new diagnosis of stage I right invasive ductal carcinoma of the breast. Patient is seen in the multidisciplinary breast clinic  STAGE:  Cancer of upper-outer quadrant of female breast  Primary site: Breast (Right)  Staging method: AJCC 7th Edition  Clinical: (T1b, NX, cM0)  Summary: (T1b, NX, cM0)  PRIOR THERAPY: #1Patient was seen for a six-month recall mammogram for a suspicious right breast nodule. This was at the 9:00 position. Because of this she underwent a repeat diagnostic mammogram performed on 01/05/2013. There was a small nodule that persisted appearing to be most likely irregular and margination. She had an ultrasound that demonstrated a small 3-4 mm nodular density. This appeared suspicious because of this patient underwent a stereotactic biopsy of the 9:00 nodule. The pathology showed an invasive ductal carcinoma with ductal carcinoma in situ. The tumor was grade 2 with associated high-grade DCIS. There was ER positive PR positive HER-2/neu negative with a Ki-67 of 10%. She had MRI of the breasts performed the MRI did not show any abnormal enhancement in the left breast. However in the right breast there was an 8 x 8 x 6 mm enhancing mass in the 9:00 region. There were post biopsy change is noted and biopsy clip was foun  #2 patient has undergone a lumpectomy on 02/01/2013. This revealed a 0.4 cm area of invasive cancer that was grade 1 with associated low-grade DCIS and atypical lobular hyperplasia one sentinel node was negative for metastatic disease. Tumor was ER +100% PR +80%. HER-2/neu negative with a Ki-67 of 50%.    #3 patient and I discussed antiestrogen therapy and we will plan on doing this with Arimidex 1 mg daily.   CURRENT THERAPY:proceed with radiation  therapy consult  INTERVAL HISTORY: [name] 72 y.o. female returns for followup visit post lumpectomy. Overall she's doing well. She tolerated surgery without any problems. She has a little bit of tenderness at the surgical site otherwise she is doing well without any problems.  MEDICAL HISTORY: Past Medical History  Diagnosis Date  . Hot flashes   . Arthritis   . Anxiety   . Sjogren's disease   . COPD (chronic obstructive pulmonary disease)   . Shortness of breath   . Cough   . Allergy     Pcn, Sulfa, Codeine  . Cataract     surgey b/l  . Depression     hx years ago    ALLERGIES:  is allergic to penicillins; codeine; and sulfa antibiotics.  MEDICATIONS:  Current Outpatient Prescriptions  Medication Sig Dispense Refill  . albuterol (PROVENTIL HFA;VENTOLIN HFA) 108 (90 BASE) MCG/ACT inhaler Inhale 2 puffs into the lungs every 6 (six) hours as needed for wheezing.      Marland Kitchen azelastine (ASTELIN) 137 MCG/SPRAY nasal spray Place 1 spray into the nose 2 (two) times daily. Use in each nostril as directed      . beta carotene w/minerals (OCUVITE) tablet Take 1 tablet by mouth daily.      . cholecalciferol (VITAMIN D) 1000 UNITS tablet Take 1,000 Units by mouth daily.      . Fluticasone-Salmeterol (ADVAIR) 100-50 MCG/DOSE AEPB Inhale 2 puffs into the lungs every morning.      . Misc Natural Products (OSTEO BI-FLEX JOINT SHIELD PO) Take by mouth daily.      Marland Kitchen  montelukast (SINGULAIR) 10 MG tablet Take 10 mg by mouth at bedtime.       . Multiple Vitamins-Minerals (CENTRUM PO) Take by mouth daily.      . naproxen (NAPROSYN) 250 MG tablet Take 50 mg by mouth daily.      Marland Kitchen PARoxetine (PAXIL) 40 MG tablet Take 40 mg by mouth daily.       . paroxetine mesylate (PEXEVA) 40 MG tablet Take 40 mg by mouth every morning. Takes 1/2 at night      . traMADol (ULTRAM) 50 MG tablet Take 1-2 tablets (50-100 mg total) by mouth every 6 (six) hours as needed for pain.  30 tablet  1  . vitamin C (ASCORBIC  ACID) 250 MG tablet Take 250 mg by mouth daily.      . calcium carbonate 200 MG capsule Take 250 mg by mouth daily. Coral calcium 100%      . exemestane (AROMASIN) 25 MG tablet Take 1 tablet (25 mg total) by mouth daily after breakfast.  90 tablet  12  . magnesium 30 MG tablet Take 30 mg by mouth daily.       No current facility-administered medications for this visit.    SURGICAL HISTORY:  Past Surgical History  Procedure Laterality Date  . Breast surgery Right 1965    biopsy  . Abdominal hysterectomy  74    partial  . Cervical fusion  2010  . Cholecystectomy  2002  . Dilation and curettage of uterus    . Colonoscopy    . Breast lumpectomy with needle localization and axillary sentinel lymph node bx Right 02/01/2013    Procedure: BREAST LUMPECTOMY WITH NEEDLE LOCALIZATION AND AXILLARY SENTINEL LYMPH NODE BX;  Surgeon: Adolph Pollack, MD;  Location: [facility];  Service: General;  Laterality: Right;    REVIEW OF SYSTEMS:  Pertinent items are noted in HPI.   HEALTH MAINTENANCE:   PHYSICAL EXAMINATION: Blood pressure 138/80, pulse 90, temperature 98.7 F (37.1 C), temperature source Oral, resp. rate 20, height 5' (1.524 m), weight 163 lb 1.6 oz (73.982 kg). Body mass index is 31.85 kg/(m^2). ECOG PERFORMANCE STATUS: 0 - Asymptomatic   General appearance: alert, cooperative and appears stated age Resp: clear to auscultation bilaterally Cardio: regular rate and rhythm GI: soft, non-tender; bowel sounds normal; no masses,  no organomegaly Extremities: extremities normal, atraumatic, no cyanosis or edema Neurologic: Grossly normal   LABORATORY DATA: Lab Results  Component Value Date   WBC 10.4* 01/13/2013   HGB 13.9 01/13/2013   HCT 40.8 01/13/2013   MCV 91.1 01/13/2013   PLT 214 01/13/2013      Chemistry      Component Value Date/Time   NA 135* 01/13/2013 0819   NA 139 09/30/2008 1353   K 3.7 01/13/2013 0819   K 4.3 09/30/2008 1353   CL 100 01/13/2013 0819    CL 103 09/30/2008 1353   CO2 24 01/13/2013 0819   CO2 29 09/30/2008 1353   BUN 13.8 01/13/2013 0819   BUN 9 09/30/2008 1353   CREATININE 0.8 01/13/2013 0819   CREATININE 0.69 09/30/2008 1353      Component Value Date/Time   CALCIUM 8.7 01/13/2013 0819   CALCIUM 9.4 09/30/2008 1353   ALKPHOS 93 01/13/2013 0819   AST 19 01/13/2013 0819   ALT 19 01/13/2013 0819   BILITOT 0.50 01/13/2013 0819       RADIOGRAPHIC STUDIES:  No results found.  ASSESSMENT: 72 year old female with  #1 T1 N0  invasive ductal carcinoma status post lumpectomy. Postoperatively patient is doing well. Tumor was ER positive PR positive HER-2/neu negative.  #2 patient is a good candidate for antiestrogen therapy consisting of Arimidex 1 mg daily for a total of 5 years. However I would like for her to be seen by radiation oncology for their input regarding the need for adjuvant radiation therapy.   PLAN:   #1 proceed with radiation oncology consultation.  #2 I will see her back in a few months time for followup.   All questions were answered. The patient knows to call the clinic with any problems, questions or concerns. We can certainly see the patient much sooner if necessary.  I spent 25 minutes counseling the patient face to face. The total time spent in the appointment was 30 minutes.    Drue Second, MD Medical/Oncology [facility] [phone] (beeper) [phone] (Office)

## 2013-03-15 ENCOUNTER — Telehealth: Payer: Self-pay | Admitting: *Deleted

## 2013-03-15 NOTE — Telephone Encounter (Signed)
Lm gv appt d/t for 04/02/13@ 2:45pm. i made the pt aware that i will mail a letter/avs...td

## 2013-03-31 ENCOUNTER — Ambulatory Visit: Payer: Medicare Other | Admitting: Oncology

## 2013-04-02 ENCOUNTER — Encounter: Payer: Self-pay | Admitting: Oncology

## 2013-04-02 ENCOUNTER — Ambulatory Visit (HOSPITAL_BASED_OUTPATIENT_CLINIC_OR_DEPARTMENT_OTHER): Payer: Medicare Other | Admitting: Oncology

## 2013-04-02 ENCOUNTER — Telehealth: Payer: Self-pay | Admitting: Oncology

## 2013-04-02 VITALS — BP 146/70 | HR 101 | Temp 98.7°F | Resp 18 | Ht 60.0 in | Wt 163.6 lb

## 2013-04-02 DIAGNOSIS — C50411 Malignant neoplasm of upper-outer quadrant of right female breast: Secondary | ICD-10-CM

## 2013-04-02 DIAGNOSIS — C50419 Malignant neoplasm of upper-outer quadrant of unspecified female breast: Secondary | ICD-10-CM

## 2013-04-02 NOTE — Patient Instructions (Addendum)
Continue the aromasin 25 mg daily  I will see you back in 6 months

## 2013-04-02 NOTE — Telephone Encounter (Signed)
, °

## 2013-04-09 ENCOUNTER — Encounter (INDEPENDENT_AMBULATORY_CARE_PROVIDER_SITE_OTHER): Payer: Self-pay | Admitting: General Surgery

## 2013-04-09 ENCOUNTER — Ambulatory Visit (INDEPENDENT_AMBULATORY_CARE_PROVIDER_SITE_OTHER): Payer: PRIVATE HEALTH INSURANCE | Admitting: General Surgery

## 2013-04-09 VITALS — BP 140/76 | HR 98 | Resp 16 | Ht 61.0 in | Wt 163.8 lb

## 2013-04-09 DIAGNOSIS — Z9889 Other specified postprocedural states: Secondary | ICD-10-CM

## 2013-04-09 NOTE — Progress Notes (Signed)
Procedure:  Right partial mastectomy and right axillary sentinel lymph node biopsy  Date:  02/01/2013  Pathology:  T1aN0  History:  She is here for second postoperative visit.  She is decided not to have radiation treatment. She is on an aromatase inhibitor (Aromasin). She is having some hot flashes. She denies any masses in her breast or adenopathy. Exam: General- Is in NAD. Right Breast-Incision is clean and intact. There is slight pink discoloration in the medial breast but no increased warmth.   Right axillary incision is clean and intact.  Assessment:  T1aN0 Right breast cancer status post right partial mastectomy and sentinel lymph node biopsy. She is doing well.  Plan:  Return visit in 3 months.

## 2013-04-09 NOTE — Patient Instructions (Signed)
Call if you feel or find something different in your breasts.

## 2013-04-11 NOTE — Progress Notes (Signed)
OFFICE PROGRESS NOTE  CC**  Michelle Blamer, MD [facility] [address] [place] [address] Dr. Lurline Hare  Dr. Avel Peace  DIAGNOSIS: 72 year old female with new diagnosis of stage I right invasive ductal carcinoma of the breast. Patient is seen in the multidisciplinary breast clinic  STAGE:  Cancer of upper-outer quadrant of female breast  Primary site: Breast (Right)  Staging method: AJCC 7th Edition  Clinical: (T1b, NX, cM0)  Summary: (T1b, NX, cM0)  PRIOR THERAPY: #1Patient was seen for a six-month recall mammogram for a suspicious right breast nodule. This was at the 9:00 position. Because of this she underwent a repeat diagnostic mammogram performed on 01/05/2013. There was a small nodule that persisted appearing to be most likely irregular and margination. She had an ultrasound that demonstrated a small 3-4 mm nodular density. This appeared suspicious because of this patient underwent a stereotactic biopsy of the 9:00 nodule. The pathology showed an invasive ductal carcinoma with ductal carcinoma in situ. The tumor was grade 2 with associated high-grade DCIS. There was ER positive PR positive HER-2/neu negative with a Ki-67 of 10%. She had MRI of the breasts performed the MRI did not show any abnormal enhancement in the left breast. However in the right breast there was an 8 x 8 x 6 mm enhancing mass in the 9:00 region. There were post biopsy change is noted and biopsy clip was foun  #2 patient has undergone a lumpectomy on 02/01/2013. This revealed a 0.4 cm area of invasive cancer that was grade 1 with associated low-grade DCIS and atypical lobular hyperplasia one sentinel node was negative for metastatic disease. Tumor was ER +100% PR +80%. HER-2/neu negative with a Ki-67 of 50%.    #3 patient and I discussed antiestrogen therapy and we will plan on doing this with Aromasin 25 mg daily   CURRENT THERAPY: Aromasin 25 mg  daily  INTERVAL HISTORY: Michelle Gonzales 72 y.o. female returns for followup visitOverall she's doing well.she met with radiation oncology and it was recommended that she not undergo radiation. There for she will continue Aromasin 25 mg daily. She denies any fevers chills night sweats headaches shortness of breath chest pains palpitations no myalgias and arthralgias remainder of the 10 point review of systems is negative. MEDICAL HISTORY: Past Medical History  Diagnosis Date  . Hot flashes   . Arthritis   . Anxiety   . Sjogren's disease   . COPD (chronic obstructive pulmonary disease)   . Shortness of breath   . Cough   . Allergy     Pcn, Sulfa, Codeine  . Cataract     surgey b/l  . Depression     hx years ago    ALLERGIES:  is allergic to penicillins; codeine; and sulfa antibiotics.  MEDICATIONS:  Current Outpatient Prescriptions  Medication Sig Dispense Refill  . albuterol (PROVENTIL HFA;VENTOLIN HFA) 108 (90 BASE) MCG/ACT inhaler Inhale 2 puffs into the lungs every 6 (six) hours as needed for wheezing.      Marland Kitchen azelastine (ASTELIN) 137 MCG/SPRAY nasal spray Place 1 spray into the nose 2 (two) times daily. Use in each nostril as directed      . beta carotene w/minerals (OCUVITE) tablet Take 1 tablet by mouth daily.      . calcium carbonate 200 MG capsule Take 250 mg by mouth daily. Coral calcium 100%      . cholecalciferol (VITAMIN D) 1000 UNITS tablet Take 1,000 Units by mouth daily.      Marland Kitchen  exemestane (AROMASIN) 25 MG tablet Take 1 tablet (25 mg total) by mouth daily after breakfast.  90 tablet  12  . Fluticasone-Salmeterol (ADVAIR) 100-50 MCG/DOSE AEPB Inhale 2 puffs into the lungs every morning.      . magnesium 30 MG tablet Take 30 mg by mouth daily.      . Misc Natural Products (OSTEO BI-FLEX JOINT SHIELD PO) Take by mouth daily.      . montelukast (SINGULAIR) 10 MG tablet Take 10 mg by mouth at bedtime.       . Multiple Vitamins-Minerals (CENTRUM PO) Take by mouth daily.       . naproxen (NAPROSYN) 250 MG tablet Take 50 mg by mouth daily.      Marland Kitchen PARoxetine (PAXIL) 40 MG tablet Take 40 mg by mouth daily.       . paroxetine mesylate (PEXEVA) 40 MG tablet Take 40 mg by mouth every morning. Takes 1/2 at night      . traMADol (ULTRAM) 50 MG tablet Take 1-2 tablets (50-100 mg total) by mouth every 6 (six) hours as needed for pain.  30 tablet  1  . vitamin C (ASCORBIC ACID) 250 MG tablet Take 250 mg by mouth daily.       No current facility-administered medications for this visit.    SURGICAL HISTORY:  Past Surgical History  Procedure Laterality Date  . Breast surgery Right 1965    biopsy  . Abdominal hysterectomy  74    partial  . Cervical fusion  2010  . Cholecystectomy  2002  . Dilation and curettage of uterus    . Colonoscopy    . Breast lumpectomy with needle localization and axillary sentinel lymph node bx Right 02/01/2013    Procedure: BREAST LUMPECTOMY WITH NEEDLE LOCALIZATION AND AXILLARY SENTINEL LYMPH NODE BX;  Surgeon: Adolph Pollack, MD;  Location: [facility];  Service: General;  Laterality: Right;    REVIEW OF SYSTEMS:  Pertinent items are noted in HPI.   HEALTH MAINTENANCE:   PHYSICAL EXAMINATION: Blood pressure 146/70, pulse 101, temperature 98.7 F (37.1 C), temperature source Oral, resp. rate 18, height 5' (1.524 m), weight 163 lb 9.6 oz (74.208 kg). Body mass index is 31.95 kg/(m^2). ECOG PERFORMANCE STATUS: 0 - Asymptomatic   General appearance: alert, cooperative and appears stated age Resp: clear to auscultation bilaterally Cardio: regular rate and rhythm GI: soft, non-tender; bowel sounds normal; no masses,  no organomegaly Extremities: extremities normal, atraumatic, no cyanosis or edema Neurologic: Grossly normal   LABORATORY DATA: Lab Results  Component Value Date   WBC 10.4* 01/13/2013   HGB 13.9 01/13/2013   HCT 40.8 01/13/2013   MCV 91.1 01/13/2013   PLT 214 01/13/2013      Chemistry      Component  Value Date/Time   NA 135* 01/13/2013 0819   NA 139 09/30/2008 1353   K 3.7 01/13/2013 0819   K 4.3 09/30/2008 1353   CL 100 01/13/2013 0819   CL 103 09/30/2008 1353   CO2 24 01/13/2013 0819   CO2 29 09/30/2008 1353   BUN 13.8 01/13/2013 0819   BUN 9 09/30/2008 1353   CREATININE 0.8 01/13/2013 0819   CREATININE 0.69 09/30/2008 1353      Component Value Date/Time   CALCIUM 8.7 01/13/2013 0819   CALCIUM 9.4 09/30/2008 1353   ALKPHOS 93 01/13/2013 0819   AST 19 01/13/2013 0819   ALT 19 01/13/2013 0819   BILITOT 0.50 01/13/2013 1610  RADIOGRAPHIC STUDIES:  No results found.  ASSESSMENT: 72 year old female with  #1 T1 N0 invasive ductal carcinoma status post lumpectomy. Postoperatively patient is doing well. Tumor was ER positive PR positive HER-2/neu negative.  #2 patient is a good candidate for antiestrogen therapy consisting of Arimidex 1 mg daily for a total of 5 years.    PLAN:  #1 patient will continue Aromasin 25 mg daily.  #2 I will see her back in 6 months time   All questions were answered. The patient knows to call the clinic with any problems, questions or concerns. We can certainly see the patient much sooner if necessary.  I spent 25 minutes counseling the patient face to face. The total time spent in the appointment was 30 minutes.    Drue Second, MD Medical/Oncology [facility] [phone] (beeper) [phone] (Office)

## 2013-05-31 ENCOUNTER — Other Ambulatory Visit: Payer: Medicare Other | Admitting: Lab

## 2013-05-31 ENCOUNTER — Ambulatory Visit: Payer: Medicare Other | Admitting: Oncology

## 2013-06-21 DEATH — deceased

## 2013-07-06 ENCOUNTER — Ambulatory Visit (INDEPENDENT_AMBULATORY_CARE_PROVIDER_SITE_OTHER): Payer: PRIVATE HEALTH INSURANCE | Admitting: General Surgery

## 2013-07-06 ENCOUNTER — Encounter (INDEPENDENT_AMBULATORY_CARE_PROVIDER_SITE_OTHER): Payer: Self-pay | Admitting: General Surgery

## 2013-07-06 VITALS — BP 142/88 | HR 88 | Temp 97.6°F | Resp 16 | Ht 61.0 in | Wt 164.2 lb

## 2013-07-06 DIAGNOSIS — C50911 Malignant neoplasm of unspecified site of right female breast: Secondary | ICD-10-CM

## 2013-07-06 DIAGNOSIS — C50919 Malignant neoplasm of unspecified site of unspecified female breast: Secondary | ICD-10-CM

## 2013-07-06 NOTE — Patient Instructions (Signed)
Call if you notice any changes in your breasts. 

## 2013-07-06 NOTE — Progress Notes (Signed)
Procedure:  Right partial mastectomy and right axillary sentinel lymph node biopsy  Date:  02/01/2013  Pathology:  T1aN0  History:   She is here for long-term followup of her right breast cancer. She is feeling well. She denies any new breast masses or adenopathy. She is not taking hormone replacement therapy-that was her decision.  Exam: General- Is in NAD. Right Breast-Well-healed lateral scar. No palpable dominant masses.  Left breast-no palpable masses or suspicious skin changes.  Lymph nodes-no palpable cervical, supraclavicular, or axillary adenopathy.  Assessment:  T1aN0 Right breast cancer status post right partial mastectomy and sentinel lymph node biopsy-no clinical evidence of recurrence. Plan:  Return visit in 3 months.

## 2013-09-24 ENCOUNTER — Encounter (INDEPENDENT_AMBULATORY_CARE_PROVIDER_SITE_OTHER): Payer: Self-pay | Admitting: General Surgery

## 2013-09-24 ENCOUNTER — Ambulatory Visit (INDEPENDENT_AMBULATORY_CARE_PROVIDER_SITE_OTHER): Payer: PRIVATE HEALTH INSURANCE | Admitting: General Surgery

## 2013-09-24 ENCOUNTER — Telehealth (INDEPENDENT_AMBULATORY_CARE_PROVIDER_SITE_OTHER): Payer: Self-pay

## 2013-09-24 ENCOUNTER — Other Ambulatory Visit (INDEPENDENT_AMBULATORY_CARE_PROVIDER_SITE_OTHER): Payer: Self-pay | Admitting: General Surgery

## 2013-09-24 VITALS — BP 130/80 | HR 80 | Temp 97.7°F | Resp 16 | Ht 61.0 in | Wt 165.4 lb

## 2013-09-24 DIAGNOSIS — N6310 Unspecified lump in the right breast, unspecified quadrant: Secondary | ICD-10-CM

## 2013-09-24 DIAGNOSIS — C50919 Malignant neoplasm of unspecified site of unspecified female breast: Secondary | ICD-10-CM

## 2013-09-24 DIAGNOSIS — C50911 Malignant neoplasm of unspecified site of right female breast: Secondary | ICD-10-CM

## 2013-09-24 NOTE — Telephone Encounter (Signed)
Pt has appt for right bilateral diagnostic mammogram and possible US biopsy at [facility] on 09/27/13 at 2:00 pm with 1:45 arrival.

## 2013-09-24 NOTE — Progress Notes (Signed)
Procedure:  Right partial mastectomy and right axillary sentinel lymph node biopsy  Date:  02/01/2013  Pathology:  T1aN0  History:   She is here for long-term followup of her right breast cancer. The past month or 2, she is noted uncomfortable fullness in the lower inner quadrant area of her right nipple.  Exam: General- Is in NAD. Right Breast-Well-healed lateral scar. There is a palpable fullness deep to the nipple areolar complex in the lower inner quadrant area. Left breast-no palpable masses or suspicious skin changes.  Lymph nodes-no palpable cervical, supraclavicular, or axillary adenopathy.  Assessment:  T1aN0 Right breast cancer status post right partial mastectomy and sentinel lymph node biopsy-New palpable fullness deep to the right nipple.  Plan: We'll refer her to [facility] for diagnostic mammogram and possible biopsy. Further recommendations based on those results.

## 2013-09-24 NOTE — Patient Instructions (Signed)
We will arrange for a mammogram and possible biopsy with [facility].  We'll discuss results with you when we get them.

## 2013-09-29 ENCOUNTER — Encounter (INDEPENDENT_AMBULATORY_CARE_PROVIDER_SITE_OTHER): Payer: Self-pay

## 2013-09-29 ENCOUNTER — Telehealth (INDEPENDENT_AMBULATORY_CARE_PROVIDER_SITE_OTHER): Payer: Self-pay

## 2013-09-29 NOTE — Telephone Encounter (Signed)
Pt had mgm and [place] at [facility].  Results are here. Pt scheduled for f/u on 10/27/13 at 9:50 am with Dr. Zella Richer.

## 2013-09-30 ENCOUNTER — Ambulatory Visit (INDEPENDENT_AMBULATORY_CARE_PROVIDER_SITE_OTHER): Payer: PRIVATE HEALTH INSURANCE | Admitting: General Surgery

## 2013-09-30 ENCOUNTER — Encounter (INDEPENDENT_AMBULATORY_CARE_PROVIDER_SITE_OTHER): Payer: Self-pay | Admitting: General Surgery

## 2013-09-30 VITALS — BP 122/80 | HR 73 | Temp 99.3°F | Resp 14 | Ht 61.0 in | Wt 163.0 lb

## 2013-09-30 DIAGNOSIS — Z09 Encounter for follow-up examination after completed treatment for conditions other than malignant neoplasm: Secondary | ICD-10-CM

## 2013-09-30 NOTE — Progress Notes (Signed)
Procedure:  Right partial mastectomy and right axillary sentinel lymph node biopsy  Date:  02/01/2013  Pathology:  T1aN0  History:   She is here for very short term followup visit for the palpable fullness in the right breast area. Imaging studies did not demonstrate any suspicious lesions. She states the area has not changed.  Exam: General- Is in NAD. Right Breast-Well-healed lateral scar. There is a palpable fullness deep to the nipple areolar complex in the lower inner quadrant area that is smooth and unchanged. Left breast-no palpable masses or suspicious skin changes.  Lymph nodes-no palpable cervical, supraclavicular, or axillary adenopathy.  Assessment:  T1aN0 Right breast cancer status post right partial mastectomy and sentinel lymph node biopsy- palpable fullness deep to the right nipple that does not appear malignant by clinical or radiographic criteria.  Plan:  A short interval followup in 6 weeks.

## 2013-09-30 NOTE — Patient Instructions (Signed)
Call if the area gets larger.

## 2013-10-01 ENCOUNTER — Ambulatory Visit (HOSPITAL_BASED_OUTPATIENT_CLINIC_OR_DEPARTMENT_OTHER): Payer: Medicare Other | Admitting: Adult Health

## 2013-10-01 ENCOUNTER — Other Ambulatory Visit (HOSPITAL_BASED_OUTPATIENT_CLINIC_OR_DEPARTMENT_OTHER): Payer: Medicare Other

## 2013-10-01 ENCOUNTER — Telehealth: Payer: Self-pay | Admitting: Oncology

## 2013-10-01 ENCOUNTER — Encounter: Payer: Self-pay | Admitting: Adult Health

## 2013-10-01 VITALS — BP 136/82 | HR 83 | Temp 97.8°F | Resp 18 | Ht 61.0 in | Wt 163.7 lb

## 2013-10-01 DIAGNOSIS — Z170 Estrogen receptor positive status [ER+]: Secondary | ICD-10-CM

## 2013-10-01 DIAGNOSIS — C50411 Malignant neoplasm of upper-outer quadrant of right female breast: Secondary | ICD-10-CM

## 2013-10-01 DIAGNOSIS — C50419 Malignant neoplasm of upper-outer quadrant of unspecified female breast: Secondary | ICD-10-CM

## 2013-10-01 LAB — CBC WITH DIFFERENTIAL/PLATELET
BASO%: 1 % (ref 0.0–2.0)
Basophils Absolute: 0.1 10*3/uL (ref 0.0–0.1)
EOS%: 1.7 % (ref 0.0–7.0)
Eosinophils Absolute: 0.2 10*3/uL (ref 0.0–0.5)
HCT: 40.8 % (ref 34.8–46.6)
HGB: 13.7 g/dL (ref 11.6–15.9)
LYMPH%: 24.6 % (ref 14.0–49.7)
MCH: 31.2 pg (ref 25.1–34.0)
MCHC: 33.7 g/dL (ref 31.5–36.0)
MCV: 92.7 fL (ref 79.5–101.0)
MONO#: 0.6 10*3/uL (ref 0.1–0.9)
MONO%: 6 % (ref 0.0–14.0)
NEUT#: 6.9 10*3/uL — ABNORMAL HIGH (ref 1.5–6.5)
NEUT%: 66.7 % (ref 38.4–76.8)
Platelets: 207 10*3/uL (ref 145–400)
RBC: 4.4 10*6/uL (ref 3.70–5.45)
RDW: 12.6 % (ref 11.2–14.5)
WBC: 10.3 10*3/uL (ref 3.9–10.3)
lymph#: 2.5 10*3/uL (ref 0.9–3.3)

## 2013-10-01 LAB — COMPREHENSIVE METABOLIC PANEL (CC13)
ALT: 15 U/L (ref 0–55)
AST: 17 U/L (ref 5–34)
Albumin: 3.7 g/dL (ref 3.5–5.0)
Alkaline Phosphatase: 81 U/L (ref 40–150)
Anion Gap: 8 mEq/L (ref 3–11)
BUN: 13.6 mg/dL (ref 7.0–26.0)
CO2: 26 mEq/L (ref 22–29)
Calcium: 9.3 mg/dL (ref 8.4–10.4)
Chloride: 100 mEq/L (ref 98–109)
Creatinine: 0.8 mg/dL (ref 0.6–1.1)
Glucose: 158 mg/dl — ABNORMAL HIGH (ref 70–140)
Potassium: 3.8 mEq/L (ref 3.5–5.1)
Sodium: 135 mEq/L — ABNORMAL LOW (ref 136–145)
Total Bilirubin: 0.39 mg/dL (ref 0.20–1.20)
Total Protein: 6.8 g/dL (ref 6.4–8.3)

## 2013-10-01 NOTE — Telephone Encounter (Signed)
, °

## 2013-10-01 NOTE — Patient Instructions (Signed)
You are doing well.  You have no sign of recurrence.  Continue taking Aromasin daily and we will see you back in 6 months.  Please call us if you have any questions or concerns.

## 2013-10-01 NOTE — Progress Notes (Signed)
OFFICE PROGRESS NOTE  CC**  [name], MD [address] [place] [address] Dr. Thea Silversmith  Dr. Jackolyn Confer  DIAGNOSIS: 73 year old female with new diagnosis of stage I right invasive ductal carcinoma of the breast. Patient is seen in the multidisciplinary breast clinic  STAGE:  Cancer of upper-outer quadrant of female breast  Primary site: Breast (Right)  Staging method: AJCC 7th Edition  Clinical: (T1b, NX, cM0)  Summary: (T1b, NX, cM0)  PRIOR THERAPY: #1Patient was seen for a six-month recall mammogram for a suspicious right breast nodule. This was at the 9:00 position. Because of this she underwent a repeat diagnostic mammogram performed on 01/05/2013. There was a small nodule that persisted appearing to be most likely irregular and margination. She had an ultrasound that demonstrated a small 3-4 mm nodular density. This appeared suspicious because of this patient underwent a stereotactic biopsy of the 9:00 nodule. The pathology showed an invasive ductal carcinoma with ductal carcinoma in situ. The tumor was grade 2 with associated high-grade DCIS. There was ER positive PR positive HER-2/neu negative with a Ki-67 of 10%. She had MRI of the breasts performed the MRI did not show any abnormal enhancement in the left breast. However in the right breast there was an 8 x 8 x 6 mm enhancing mass in the 9:00 region. There were post biopsy change is noted and biopsy clip was foun  #2 patient has undergone a lumpectomy on 02/01/2013. This revealed a 0.4 cm area of invasive cancer that was grade 1 with associated low-grade DCIS and atypical lobular hyperplasia one sentinel node was negative for metastatic disease. Tumor was ER +100% PR +80%. HER-2/neu negative with a Ki-67 of 50%.   #3 patient and I discussed antiestrogen therapy and we will plan on doing this with Aromasin 25 mg daily   CURRENT THERAPY: Aromasin 25 mg daily  INTERVAL HISTORY: Michelle Gonzales  73 y.o. female returns for followup visit today of her h/o invasive ductal carcinoma of the breast.  She is taking Aromasin 25 mg daily, and she reports hot flashes and an increased libido with taking this medication.  She has mild aches in the joints of her hands, and vaginal dryness and dry eyes.  She does have a h/o of Sjogrens as well.  She recently had found a new nodule in her breast about a week ago and went to see Dr. Zella Richer who sent her for an ultrasound and mammo, that didn't see anything.  Otherwise, she denies fevers, night sweats, unintentional weight loss, new pain, or any further concerns.  A 10 point ROS is otherwise negative.    MEDICAL HISTORY: Past Medical History  Diagnosis Date  . Hot flashes   . Arthritis   . Anxiety   . Sjogren's disease   . COPD (chronic obstructive pulmonary disease)   . Shortness of breath   . Cough   . Allergy     Pcn, Sulfa, Codeine  . Cataract     surgey b/l  . Depression     hx years ago    ALLERGIES:  is allergic to penicillins; codeine; and sulfa antibiotics.  MEDICATIONS:  Current Outpatient Prescriptions  Medication Sig Dispense Refill  . albuterol (PROVENTIL HFA;VENTOLIN HFA) 108 (90 BASE) MCG/ACT inhaler Inhale 2 puffs into the lungs every 6 (six) hours as needed for wheezing.      Marland Kitchen azelastine (ASTELIN) 137 MCG/SPRAY nasal spray Place 1 spray into the nose 2 (two) times daily. Use  in each nostril as directed      . beta carotene w/minerals (OCUVITE) tablet Take 1 tablet by mouth daily.      . calcium carbonate 200 MG capsule Take 250 mg by mouth daily. Coral calcium 100%      . cholecalciferol (VITAMIN D) 1000 UNITS tablet Take 1,000 Units by mouth daily.      Marland Kitchen exemestane (AROMASIN) 25 MG tablet Take 1 tablet (25 mg total) by mouth daily after breakfast.  90 tablet  12  . Fluticasone-Salmeterol (ADVAIR) 100-50 MCG/DOSE AEPB Inhale 2 puffs into the lungs every morning.      . magnesium 30 MG tablet Take 30 mg by mouth daily.       . montelukast (SINGULAIR) 10 MG tablet Take 10 mg by mouth at bedtime.       . Multiple Vitamins-Minerals (CENTRUM PO) Take by mouth daily.      . naproxen (NAPROSYN) 250 MG tablet Take 50 mg by mouth daily.      Marland Kitchen OVER THE COUNTER MEDICATION       . PARoxetine (PAXIL) 40 MG tablet Take 40 mg by mouth daily.       . vitamin C (ASCORBIC ACID) 250 MG tablet Take 250 mg by mouth daily.       No current facility-administered medications for this visit.    SURGICAL HISTORY:  Past Surgical History  Procedure Laterality Date  . Breast surgery Right 1965    biopsy  . Abdominal hysterectomy  74    partial  . Cervical fusion  2010  . Cholecystectomy  2002  . Dilation and curettage of uterus    . Colonoscopy    . Breast lumpectomy with needle localization and axillary sentinel lymph node bx Right 02/01/2013    Procedure: BREAST LUMPECTOMY WITH NEEDLE LOCALIZATION AND AXILLARY SENTINEL LYMPH NODE BX;  Surgeon: Odis Hollingshead, MD;  Location: [facility];  Service: General;  Laterality: Right;    REVIEW OF SYSTEMS:  A 10 point review of systems was conducted and is otherwise negative except for what is noted above.    Health Maintenance  Mammogram: 02/01/13 Colonoscopy:unsure, thinks it was 1-2 years ago Bone Density Scan: 03/04/13 Pap Smear: does not have anymore, has had [facility] Eye Exam: Due this year Vitamin D Level: followed by Dr. Kenton Kingfisher, PCP Lipid Panel:  Followed by Dr. Kenton Kingfisher PCP, last checked last year  PHYSICAL EXAMINATION: Blood pressure 136/82, pulse 83, temperature 97.8 F (36.6 C), temperature source Oral, resp. rate 18, height _0  (1.549 m), weight 163 lb 11.2 oz (74.254 kg). Body mass index is 30.95 kg/(m^2). GENERAL: Patient is a well appearing female in no acute distress HEENT:  Sclerae anicteric.  Oropharynx clear and moist. No ulcerations or evidence of oropharyngeal candidiasis. Neck is supple.  NODES:  No cervical, supraclavicular, or axillary  lymphadenopathy palpated.  BREAST EXAM: right breast lumpectomy scar without nodularity, no masses or lesions, left breast no masses or lesions, no sign of recurrence, benign breast exam.  LUNGS:  Clear to auscultation bilaterally.  No wheezes or rhonchi. HEART:  Regular rate and rhythm. No murmur appreciated. ABDOMEN:  Soft, nontender.  Positive, normoactive bowel sounds. No organomegaly palpated. MSK:  No focal spinal tenderness to palpation. Full range of motion bilaterally in the upper extremities. EXTREMITIES:  No peripheral edema.   SKIN:  Clear with no obvious rashes or skin changes. No nail dyscrasia. NEURO:  Nonfocal. Well oriented.  Appropriate affect. ECOG PERFORMANCE STATUS: 0 -  Asymptomatic      LABORATORY DATA: Lab Results  Component Value Date   WBC 10.3 10/01/2013   HGB 13.7 10/01/2013   HCT 40.8 10/01/2013   MCV 92.7 10/01/2013   PLT 207 10/01/2013      Chemistry      Component Value Date/Time   NA 135* 10/01/2013 1416   NA 139 09/30/2008 1353   K 3.8 10/01/2013 1416   K 4.3 09/30/2008 1353   CL 100 01/13/2013 0819   CL 103 09/30/2008 1353   CO2 26 10/01/2013 1416   CO2 29 09/30/2008 1353   BUN 13.6 10/01/2013 1416   BUN 9 09/30/2008 1353   CREATININE 0.8 10/01/2013 1416   CREATININE 0.69 09/30/2008 1353      Component Value Date/Time   CALCIUM 9.3 10/01/2013 1416   CALCIUM 9.4 09/30/2008 1353   ALKPHOS 81 10/01/2013 1416   AST 17 10/01/2013 1416   ALT 15 10/01/2013 1416   BILITOT 0.39 10/01/2013 1416       RADIOGRAPHIC STUDIES:  No results found.  ASSESSMENT: 73 year old female with  #1 T1 N0 invasive ductal carcinoma status post lumpectomy. Postoperatively patient is doing well. Tumor was ER positive PR positive HER-2/neu negative.  #2 patient is a good candidate for antiestrogen therapy consisting of Aromasin 1 mg daily for a total of 5 years.    PLAN:  #1 Patient is doing well, she has no sign of recurrence.  She will continue taking Aromasin daily, she is  tolerating it well.  We reviewed her health maintenance above.  We discussed survivorship and continuing to f/u with Dr. Zella Richer and her PCP.  #2  Patient will return in 6 months for labs and evaluation.    All questions were answered. The patient knows to call the clinic with any problems, questions or concerns. We can certainly see the patient much sooner if necessary.  I spent 25 minutes counseling the patient face to face. The total time spent in the appointment was 30 minutes.  Minette Headland, [place] [phone]

## 2013-10-27 ENCOUNTER — Ambulatory Visit (INDEPENDENT_AMBULATORY_CARE_PROVIDER_SITE_OTHER): Payer: PRIVATE HEALTH INSURANCE | Admitting: General Surgery

## 2013-11-09 ENCOUNTER — Ambulatory Visit (INDEPENDENT_AMBULATORY_CARE_PROVIDER_SITE_OTHER): Payer: PRIVATE HEALTH INSURANCE | Admitting: General Surgery

## 2013-11-09 ENCOUNTER — Encounter (INDEPENDENT_AMBULATORY_CARE_PROVIDER_SITE_OTHER): Payer: Self-pay | Admitting: General Surgery

## 2013-11-09 VITALS — BP 118/70 | HR 88 | Temp 97.3°F | Resp 16 | Ht 61.0 in | Wt 160.0 lb

## 2013-11-09 DIAGNOSIS — C50919 Malignant neoplasm of unspecified site of unspecified female breast: Secondary | ICD-10-CM

## 2013-11-09 NOTE — Patient Instructions (Signed)
Call if you find any new lumps in your breasts or chest wall. 

## 2013-11-09 NOTE — Progress Notes (Signed)
Procedure:  Right partial mastectomy and right axillary sentinel lymph node biopsy  Date:  02/01/2013  Pathology:  T1aN0  History:   She is here for another short term followup visit for the palpable fullness in the right breast area.  It is not bothering her any longer. Exam: General- Is in NAD. Right Breast-Well-healed lateral scar. There is a palpable fullness deep to the nipple areolar complex in the lower inner quadrant area that remains smooth and unchanged.  Assessment:  T1aN0 Right breast cancer status post right partial mastectomy and sentinel lymph node biopsy- palpable fullness deep to the right nipple that still does not appear malignant by clinical or radiographic criteria.  Plan:  RTC in 3 months.

## 2014-02-07 ENCOUNTER — Ambulatory Visit (INDEPENDENT_AMBULATORY_CARE_PROVIDER_SITE_OTHER): Payer: PRIVATE HEALTH INSURANCE | Admitting: General Surgery

## 2014-02-14 IMAGING — CR DG CHEST 2V
2 series · 2 of 2 positions shown · non-contrast
Comparison: 01/30/2010

CLINICAL DATA: Preop invasive right breast cancer

CHEST - 2 VIEW

[w chest pa]
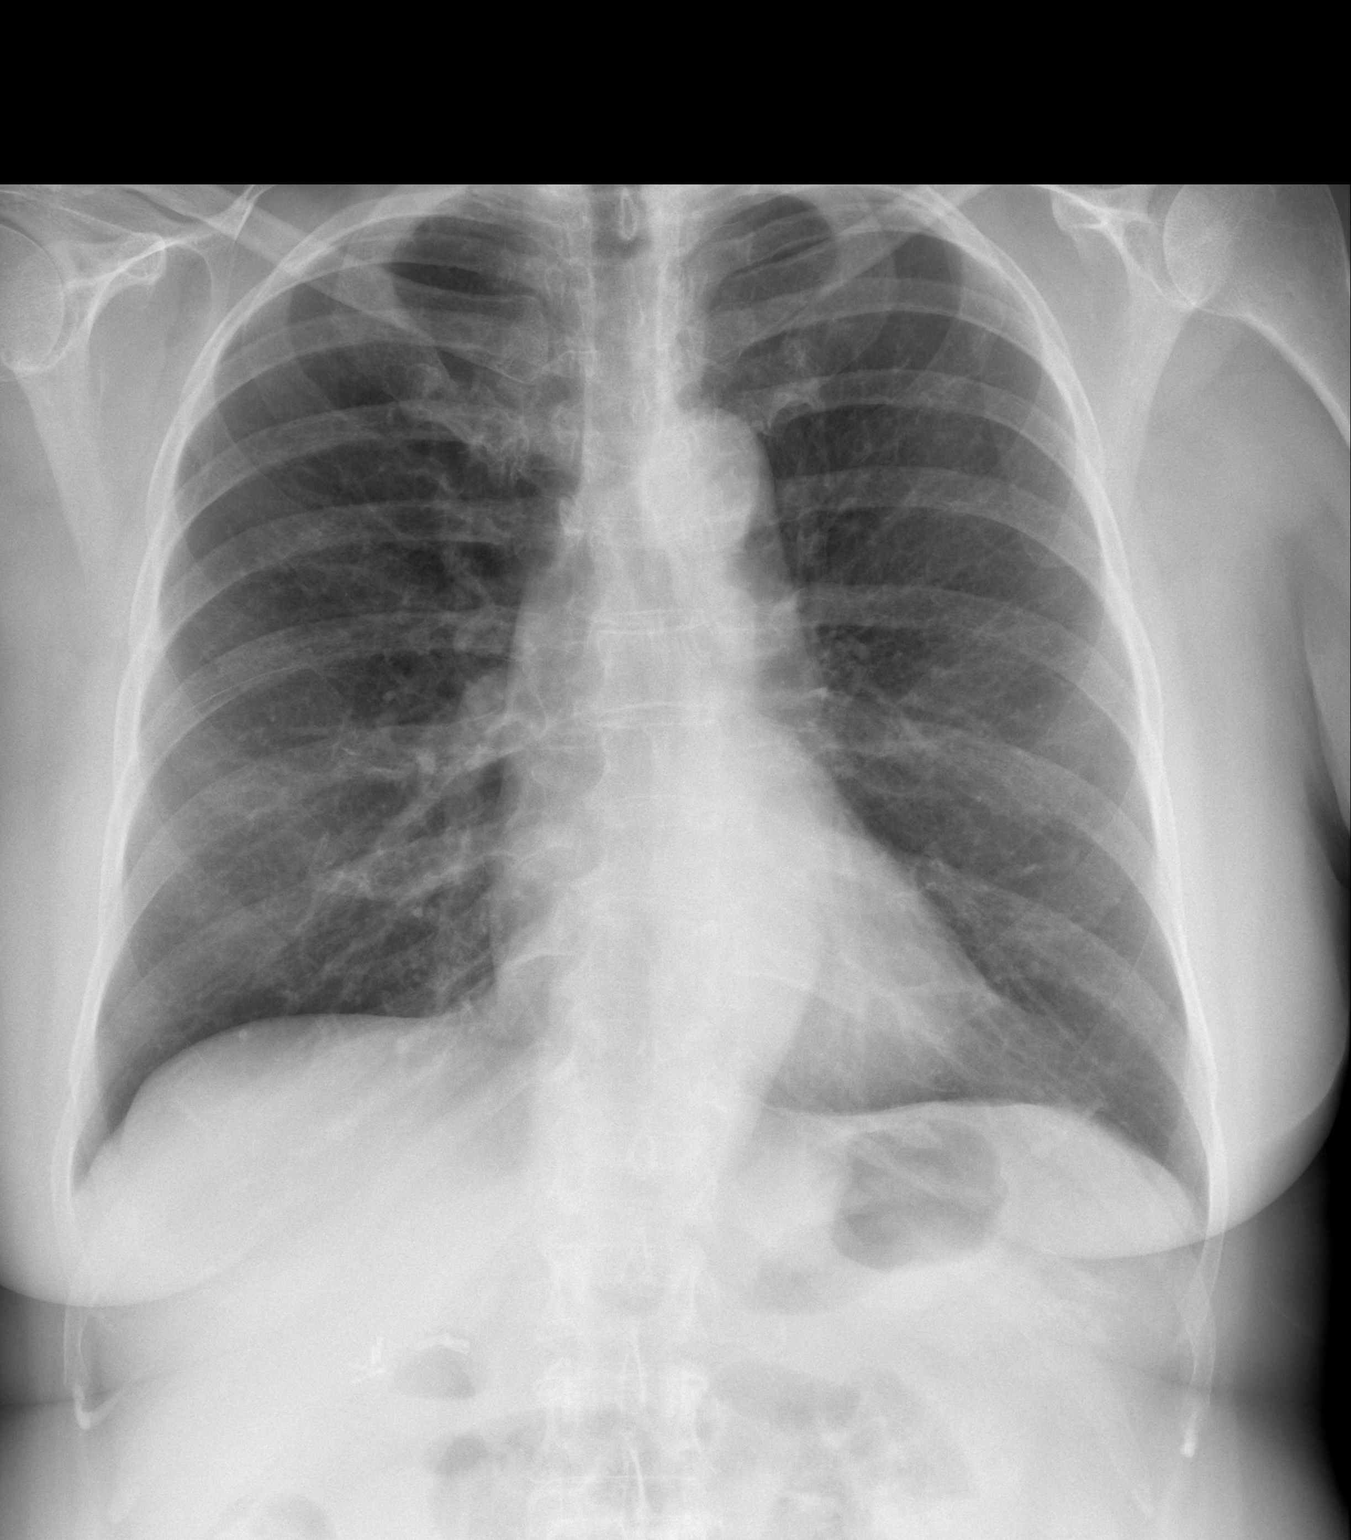

[w chest lat]
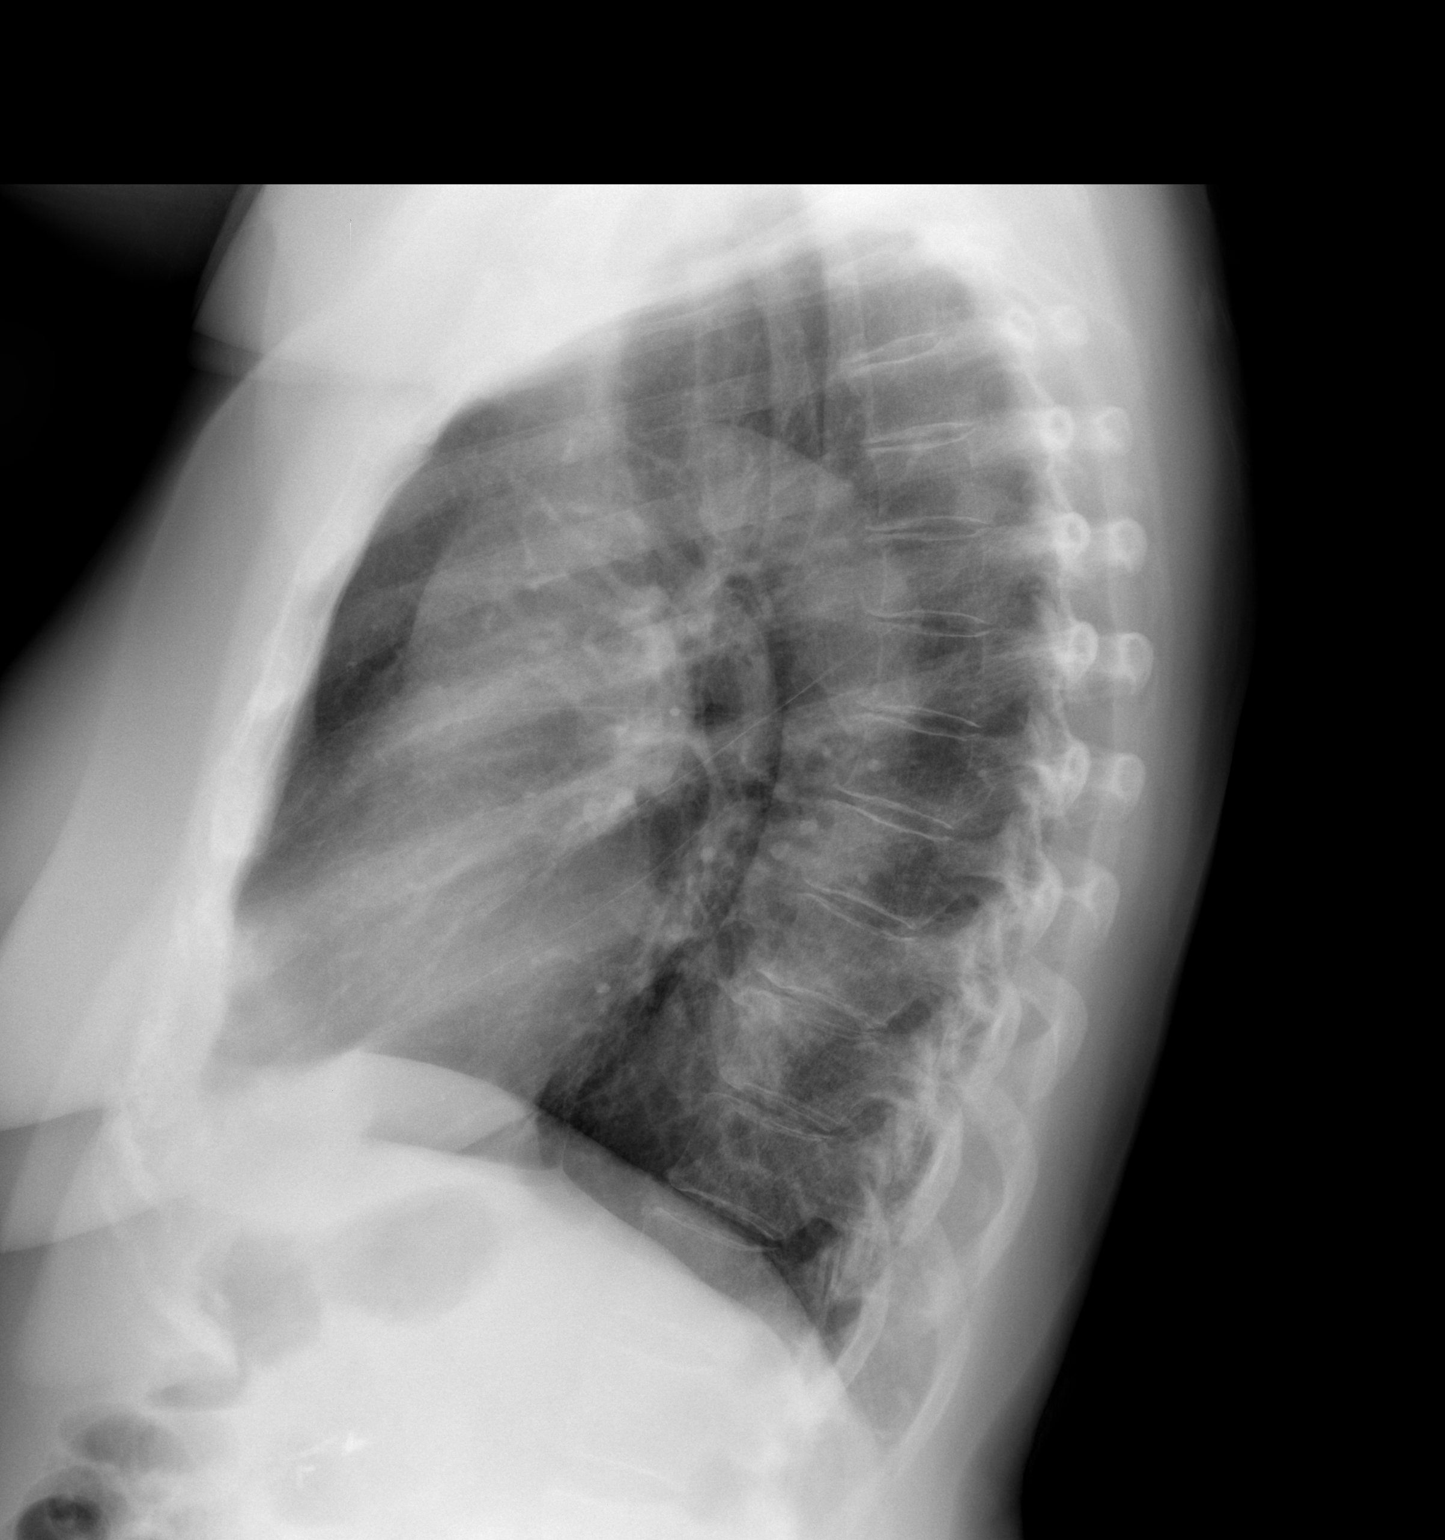

[2 of 2 positions shown; findings below may reference images not displayed]

FINDINGS: Lungs are clear.  No pleural effusion or pneumothorax.

Cardiomediastinal silhouette is within normal limits.

Degenerative changes of the visualized thoracolumbar spine.
Cervical spine fixation hardware.

Cholecystectomy clips.
IMPRESSION: No evidence of acute cardiopulmonary disease.

## 2014-04-05 ENCOUNTER — Telehealth: Payer: Self-pay | Admitting: Adult Health

## 2014-04-07 ENCOUNTER — Other Ambulatory Visit: Payer: Medicare Other

## 2014-04-07 ENCOUNTER — Ambulatory Visit: Payer: Medicare Other | Admitting: Oncology

## 2014-04-18 ENCOUNTER — Ambulatory Visit (HOSPITAL_BASED_OUTPATIENT_CLINIC_OR_DEPARTMENT_OTHER): Payer: Medicare Other | Admitting: Adult Health

## 2014-04-18 ENCOUNTER — Other Ambulatory Visit (HOSPITAL_BASED_OUTPATIENT_CLINIC_OR_DEPARTMENT_OTHER): Payer: Medicare Other

## 2014-04-18 ENCOUNTER — Encounter: Payer: Self-pay | Admitting: Adult Health

## 2014-04-18 VITALS — BP 144/77 | HR 96 | Temp 98.4°F | Resp 20 | Ht 61.0 in | Wt 157.0 lb

## 2014-04-18 DIAGNOSIS — C50919 Malignant neoplasm of unspecified site of unspecified female breast: Secondary | ICD-10-CM

## 2014-04-18 DIAGNOSIS — C50411 Malignant neoplasm of upper-outer quadrant of right female breast: Secondary | ICD-10-CM

## 2014-04-18 DIAGNOSIS — Z170 Estrogen receptor positive status [ER+]: Secondary | ICD-10-CM

## 2014-04-18 DIAGNOSIS — N63 Unspecified lump in unspecified breast: Secondary | ICD-10-CM

## 2014-04-18 DIAGNOSIS — R928 Other abnormal and inconclusive findings on diagnostic imaging of breast: Secondary | ICD-10-CM

## 2014-04-18 DIAGNOSIS — E559 Vitamin D deficiency, unspecified: Secondary | ICD-10-CM

## 2014-04-18 LAB — CBC WITH DIFFERENTIAL/PLATELET
BASO%: 0.7 % (ref 0.0–2.0)
Basophils Absolute: 0.1 10*3/uL (ref 0.0–0.1)
EOS%: 1.5 % (ref 0.0–7.0)
Eosinophils Absolute: 0.1 10*3/uL (ref 0.0–0.5)
HCT: 41.8 % (ref 34.8–46.6)
HGB: 13.9 g/dL (ref 11.6–15.9)
LYMPH%: 23.5 % (ref 14.0–49.7)
MCH: 30.8 pg (ref 25.1–34.0)
MCHC: 33.2 g/dL (ref 31.5–36.0)
MCV: 92.8 fL (ref 79.5–101.0)
MONO#: 0.7 10*3/uL (ref 0.1–0.9)
MONO%: 7 % (ref 0.0–14.0)
NEUT#: 6.4 10*3/uL (ref 1.5–6.5)
NEUT%: 67.3 % (ref 38.4–76.8)
Platelets: 203 10*3/uL (ref 145–400)
RBC: 4.51 10*6/uL (ref 3.70–5.45)
RDW: 12.7 % (ref 11.2–14.5)
WBC: 9.5 10*3/uL (ref 3.9–10.3)
lymph#: 2.2 10*3/uL (ref 0.9–3.3)

## 2014-04-18 LAB — COMPREHENSIVE METABOLIC PANEL (CC13)
ALT: 32 U/L (ref 0–55)
AST: 28 U/L (ref 5–34)
Albumin: 3.8 g/dL (ref 3.5–5.0)
Alkaline Phosphatase: 81 U/L (ref 40–150)
Anion Gap: 7 mEq/L (ref 3–11)
BUN: 15.5 mg/dL (ref 7.0–26.0)
CO2: 28 mEq/L (ref 22–29)
Calcium: 9.6 mg/dL (ref 8.4–10.4)
Chloride: 102 mEq/L (ref 98–109)
Creatinine: 0.9 mg/dL (ref 0.6–1.1)
Glucose: 84 mg/dl (ref 70–140)
Potassium: 4.2 mEq/L (ref 3.5–5.1)
Sodium: 137 mEq/L (ref 136–145)
Total Bilirubin: 0.58 mg/dL (ref 0.20–1.20)
Total Protein: 7.2 g/dL (ref 6.4–8.3)

## 2014-04-18 NOTE — Patient Instructions (Signed)
You are doing well.  Continue taking Aromasin.  We will get an ultrasound and mammogram of the left breast.  Continue healthy diet, exercise, and monthly breast exams.    Breast Self-Awareness Practicing breast self-awareness may pick up problems early, prevent significant medical complications, and possibly save your life. By practicing breast self-awareness, you can become familiar with how your breasts look and feel and if your breasts are changing. This allows you to notice changes early. It can also offer you some reassurance that your breast health is good. One way to learn what is normal for your breasts and whether your breasts are changing is to do a breast self-exam. If you find a lump or something that was not present in the past, it is best to contact your caregiver right away. Other findings that should be evaluated by your caregiver include nipple discharge, especially if it is bloody; skin changes or reddening; areas where the skin seems to be pulled in (retracted); or new lumps and bumps. Breast pain is seldom associated with cancer (malignancy), but should also be evaluated by a caregiver. HOW TO PERFORM A BREAST SELF-EXAM The best time to examine your breasts is 5-7 days after your menstrual period is over. During menstruation, the breasts are lumpier, and it may be more difficult to pick up changes. If you do not menstruate, have reached menopause, or had your uterus removed (hysterectomy), you should examine your breasts at regular intervals, such as monthly. If you are breastfeeding, examine your breasts after a feeding or after using a breast pump. Breast implants do not decrease the risk for lumps or tumors, so continue to perform breast self-exams as recommended. Talk to your caregiver about how to determine the difference between the implant and breast tissue. Also, talk about the amount of pressure you should use during the exam. Over time, you will become more familiar with the  variations of your breasts and more comfortable with the exam. A breast self-exam requires you to remove all your clothes above the waist. 1. Look at your breasts and nipples. Stand in front of a mirror in a room with good lighting. With your hands on your hips, push your hands firmly downward. Look for a difference in shape, contour, and size from one breast to the other (asymmetry). Asymmetry includes puckers, dips, or bumps. Also, look for skin changes, such as reddened or scaly areas on the breasts. Look for nipple changes, such as discharge, dimpling, repositioning, or redness. 2. Carefully feel your breasts. This is best done either in the shower or tub while using soapy water or when flat on your back. Place the arm (on the side of the breast you are examining) above your head. Use the pads (not the fingertips) of your three middle fingers on your opposite hand to feel your breasts. Start in the underarm area and use  inch (2 cm) overlapping circles to feel your breast. Use 3 different levels of pressure (light, medium, and firm pressure) at each circle before moving to the next circle. The light pressure is needed to feel the tissue closest to the skin. The medium pressure will help to feel breast tissue a little deeper, while the firm pressure is needed to feel the tissue close to the ribs. Continue the overlapping circles, moving downward over the breast until you feel your ribs below your breast. Then, move one finger-width towards the center of the body. Continue to use the  inch (2 cm) overlapping circles to feel  your breast as you move slowly up toward the collar bone (clavicle) near the base of the neck. Continue the up and down exam using all 3 pressures until you reach the middle of the chest. Do this with each breast, carefully feeling for lumps or changes. 3.  Keep a written record with breast changes or normal findings for each breast. By writing this information down, you do not need to  depend only on memory for size, tenderness, or location. Write down where you are in your menstrual cycle, if you are still menstruating. Breast tissue can have some lumps or thick tissue. However, see your caregiver if you find anything that concerns you.  SEEK MEDICAL CARE IF:  You see a change in shape, contour, or size of your breasts or nipples.   You see skin changes, such as reddened or scaly areas on the breasts or nipples.   You have an unusual discharge from your nipples.   You feel a new lump or unusually thick areas.  Document Released: 07/08/2005 Document Revised: 06/24/2012 Document Reviewed: 10/23/2011 [facility] Patient Information 2015 [place], [place]. This information is not intended to replace advice given to you by your health care provider. Make sure you discuss any questions you have with your health care provider.

## 2014-04-18 NOTE — Progress Notes (Signed)
OFFICE PROGRESS NOTE  CC**  [name], MD [address] [place] [address] Dr. Thea Silversmith  Dr. Jackolyn Confer  DIAGNOSIS: 73 year old female with new diagnosis of stage I right side invasive ductal carcinoma of the breast. Patient is seen in the multidisciplinary breast clinic  STAGE:  Cancer of upper-outer quadrant of female breast  Primary site: Breast (Right)  Staging method: AJCC 7th Edition  Clinical: (T1b, NX, cM0)  Summary: (T1b, NX, cM0)  PRIOR THERAPY: #1Patient was seen for a six-month recall mammogram for a suspicious right breast nodule. This was at the 9:00 position. Because of this she underwent a repeat diagnostic mammogram performed on 01/05/2013. There was a small nodule that persisted appearing to be most likely irregular and margination. She had an ultrasound that demonstrated a small 3-4 mm nodular density. This appeared suspicious because of this patient underwent a stereotactic biopsy of the 9:00 nodule. The pathology showed an invasive ductal carcinoma with ductal carcinoma in situ. The tumor was grade 2 with associated high-grade DCIS. There was ER positive PR positive HER-2/neu negative with a Ki-67 of 10%. She had MRI of the breasts performed the MRI did not show any abnormal enhancement in the left breast. However in the right breast there was an 8 x 8 x 6 mm enhancing mass in the 9:00 region. There were post biopsy change is noted and biopsy clip was foun  #2 patient has undergone a lumpectomy on 02/01/2013. This revealed a 0.4 cm area of invasive cancer that was grade 1 with associated low-grade DCIS and atypical lobular hyperplasia one sentinel node was negative for metastatic disease. Tumor was ER +100% PR +80%. HER-2/neu negative with a Ki-67 of 50%.   #3 Aromasin 47m daily starting 02/24/2014.   CURRENT THERAPY: Aromasin 25 mg daily  INTERVAL HISTORY: Michelle COCHRANE89y.o. female returns for followup visit today of her h/o  invasive ductal carcinoma of the breast.  She is c/o a new nodule in her left breast that feels like a marble.  She first noticed this two weeks ago.  She is taking Aromasin daily.  She denies joint aches, dry eyes, vaginal dryness, or any further concerns.  She denies any new pain, headaches, weakness, numbness, bowel/bladder changes, or any further concerns.  We updated her health maintenance below.   MEDICAL HISTORY: Past Medical History  Diagnosis Date  . Hot flashes   . Arthritis   . Anxiety   . Sjogren's disease   . COPD (chronic obstructive pulmonary disease)   . Shortness of breath   . Cough   . Allergy     Pcn, Sulfa, Codeine  . Cataract     surgey b/l  . Depression     hx years ago    ALLERGIES:  is allergic to penicillins; codeine; and sulfa antibiotics.  MEDICATIONS:  Current Outpatient Prescriptions  Medication Sig Dispense Refill  . albuterol (PROVENTIL HFA;VENTOLIN HFA) 108 (90 BASE) MCG/ACT inhaler Inhale 2 puffs into the lungs every 6 (six) hours as needed for wheezing.      .Marland Kitchenazelastine (ASTELIN) 137 MCG/SPRAY nasal spray Place 1 spray into the nose 2 (two) times daily. Use in each nostril as directed      . beta carotene w/minerals (OCUVITE) tablet Take 1 tablet by mouth daily.      . calcium carbonate 200 MG capsule Take 250 mg by mouth daily. Coral calcium 100%      . cholecalciferol (VITAMIN D) 1000 UNITS  tablet Take 1,000 Units by mouth daily.      . diclofenac (VOLTAREN) 75 MG EC tablet       . exemestane (AROMASIN) 25 MG tablet Take 1 tablet (25 mg total) by mouth daily after breakfast.  90 tablet  12  . Fluticasone-Salmeterol (ADVAIR) 100-50 MCG/DOSE AEPB Inhale 2 puffs into the lungs every morning.      . hydroxychloroquine (PLAQUENIL) 200 MG tablet       . magnesium 30 MG tablet Take 30 mg by mouth daily.      . montelukast (SINGULAIR) 10 MG tablet Take 10 mg by mouth at bedtime.       . Multiple Vitamins-Minerals (CENTRUM PO) Take by mouth daily.       . naproxen (NAPROSYN) 250 MG tablet Take 50 mg by mouth daily.      Marland Kitchen OVER THE COUNTER MEDICATION       . PARoxetine (PAXIL) 40 MG tablet Take 40 mg by mouth daily.       . vitamin C (ASCORBIC ACID) 250 MG tablet Take 250 mg by mouth daily.       No current facility-administered medications for this visit.    SURGICAL HISTORY:  Past Surgical History  Procedure Laterality Date  . Breast surgery Right 1965    biopsy  . Abdominal hysterectomy  74    partial  . Cervical fusion  2010  . Cholecystectomy  2002  . Dilation and curettage of uterus    . Colonoscopy    . Breast lumpectomy with needle localization and axillary sentinel lymph node bx Right 02/01/2013    Procedure: BREAST LUMPECTOMY WITH NEEDLE LOCALIZATION AND AXILLARY SENTINEL LYMPH NODE BX;  Surgeon: Odis Hollingshead, MD;  Location: [facility];  Service: General;  Laterality: Right;    REVIEW OF SYSTEMS:  A 10 point review of systems was conducted and is otherwise negative except for what is noted above.    Health Maintenance Mammogram: 01/2014 Colonoscopy:unsure, thinks it was 1-2 years ago Bone Density Scan: 03/04/13 Pap Smear: does not have anymore, has had TAH Eye Exam: 2015 Vitamin D Level: followed by Dr. Kenton Kingfisher, PCP Lipid Panel:  Followed by Dr. Kenton Kingfisher PCP  PHYSICAL EXAMINATION: Blood pressure 144/77, pulse 96, temperature 98.4 F (36.9 C), temperature source Oral, resp. rate 20, height 5' 1"  (1.549 m), weight 157 lb (71.215 kg). Body mass index is 29.68 kg/(m^2). GENERAL: Patient is a well appearing female in no acute distress HEENT:  Sclerae anicteric.  Oropharynx clear and moist. No ulcerations or evidence of oropharyngeal candidiasis. Neck is supple.  NODES:  No cervical, supraclavicular, or axillary lymphadenopathy palpated.  BREAST EXAM: small 1 cm soft nodule in upper left medial breast.  Otherwise no masses/skin changes, right breast no nodules or masses.  Lumpectomy without nodularity.   LUNGS:  Clear to auscultation bilaterally.  No wheezes or rhonchi. HEART:  Regular rate and rhythm. No murmur appreciated. ABDOMEN:  Soft, nontender.  Positive, normoactive bowel sounds. No organomegaly palpated. MSK:  No focal spinal tenderness to palpation. Full range of motion bilaterally in the upper extremities. EXTREMITIES:  No peripheral edema.   SKIN:  Clear with no obvious rashes or skin changes. No nail dyscrasia. NEURO:  Nonfocal. Well oriented.  Appropriate affect. ECOG PERFORMANCE STATUS: 0 - Asymptomatic      LABORATORY DATA: Lab Results  Component Value Date   WBC 9.5 04/18/2014   HGB 13.9 04/18/2014   HCT 41.8 04/18/2014   MCV 92.8 04/18/2014  PLT 203 04/18/2014      Chemistry      Component Value Date/Time   NA 137 04/18/2014 1433   NA 139 09/30/2008 1353   K 4.2 04/18/2014 1433   K 4.3 09/30/2008 1353   CL 100 01/13/2013 0819   CL 103 09/30/2008 1353   CO2 28 04/18/2014 1433   CO2 29 09/30/2008 1353   BUN 15.5 04/18/2014 1433   BUN 9 09/30/2008 1353   CREATININE 0.9 04/18/2014 1433   CREATININE 0.69 09/30/2008 1353      Component Value Date/Time   CALCIUM 9.6 04/18/2014 1433   CALCIUM 9.4 09/30/2008 1353   ALKPHOS 81 04/18/2014 1433   AST 28 04/18/2014 1433   ALT 32 04/18/2014 1433   BILITOT 0.58 04/18/2014 1433       RADIOGRAPHIC STUDIES:  No results found.  ASSESSMENT: 73 year old female with  #1 T1 N0 invasive ductal carcinoma status post lumpectomy. Postoperatively patient is doing well. Tumor was ER positive PR positive HER-2/neu negative.  #2 Patient is taking Aromasin 7m daily starting 02/2013.    PLAN:  Patient is doing we today.  I reviewed her labs with her today and they are normal.  She will continue taking Aromasin daily she is tolerating it well.    Due to the left breast nodule, I ordered a diagnostic mammogram and left breast ultrasound to be performed at [facility].    Emmaline was recommended to exercise, eat a healthy diet, and perform monthly  breast exams.  We will see Lenny back in 6 months, or sooner if needed depending on her mammogram and ultrasound results.    All questions were answered. The patient knows to call the clinic with any problems, questions or concerns. We can certainly see the patient much sooner if necessary.  I spent 25 minutes counseling the patient face to face. The total time spent in the appointment was 30 minutes.  LMinette Headland [phone]

## 2014-04-18 NOTE — Addendum Note (Signed)
Addended by: Minette Headland on: 04/18/2014 11:33 PM   Modules accepted: Orders, SmartSet

## 2014-04-19 LAB — VITAMIN D 25 HYDROXY (VIT D DEFICIENCY, FRACTURES): Vit D, 25-Hydroxy: 51 ng/mL (ref 30–89)

## 2014-05-03 ENCOUNTER — Encounter: Payer: Self-pay | Admitting: Hematology and Oncology

## 2014-05-20 ENCOUNTER — Other Ambulatory Visit: Payer: Self-pay | Admitting: *Deleted

## 2014-05-20 DIAGNOSIS — C50411 Malignant neoplasm of upper-outer quadrant of right female breast: Secondary | ICD-10-CM

## 2014-05-20 MED ORDER — EXEMESTANE 25 MG PO TABS: 25.0000 mg | ORAL_TABLET | Freq: Every day | ORAL | Status: DC

## 2014-06-17 ENCOUNTER — Telehealth: Payer: Self-pay | Admitting: Hematology

## 2014-06-17 NOTE — Telephone Encounter (Signed)
, °

## 2014-08-06 ENCOUNTER — Telehealth: Payer: Self-pay | Admitting: Hematology

## 2014-08-06 NOTE — Telephone Encounter (Signed)
lvm for  pt regarding to d.t. change of appt.....mailed pt appt sched/letter

## 2014-10-07 ENCOUNTER — Other Ambulatory Visit: Payer: Self-pay | Admitting: *Deleted

## 2014-10-07 DIAGNOSIS — C50419 Malignant neoplasm of upper-outer quadrant of unspecified female breast: Secondary | ICD-10-CM

## 2014-10-10 ENCOUNTER — Other Ambulatory Visit: Payer: Self-pay | Admitting: Hematology

## 2014-10-10 ENCOUNTER — Ambulatory Visit (HOSPITAL_BASED_OUTPATIENT_CLINIC_OR_DEPARTMENT_OTHER): Payer: Medicare Other | Admitting: Hematology

## 2014-10-10 ENCOUNTER — Encounter: Payer: Self-pay | Admitting: Hematology

## 2014-10-10 ENCOUNTER — Other Ambulatory Visit (HOSPITAL_BASED_OUTPATIENT_CLINIC_OR_DEPARTMENT_OTHER): Payer: Medicare Other

## 2014-10-10 ENCOUNTER — Telehealth: Payer: Self-pay | Admitting: Hematology

## 2014-10-10 VITALS — BP 123/65 | HR 77 | Temp 98.9°F | Resp 18 | Ht 61.0 in | Wt 156.7 lb

## 2014-10-10 DIAGNOSIS — Z170 Estrogen receptor positive status [ER+]: Secondary | ICD-10-CM

## 2014-10-10 DIAGNOSIS — C50411 Malignant neoplasm of upper-outer quadrant of right female breast: Secondary | ICD-10-CM

## 2014-10-10 DIAGNOSIS — C50419 Malignant neoplasm of upper-outer quadrant of unspecified female breast: Secondary | ICD-10-CM

## 2014-10-10 DIAGNOSIS — Z780 Asymptomatic menopausal state: Secondary | ICD-10-CM

## 2014-10-10 DIAGNOSIS — C50811 Malignant neoplasm of overlapping sites of right female breast: Secondary | ICD-10-CM | POA: Diagnosis not present

## 2014-10-10 LAB — CBC WITH DIFFERENTIAL/PLATELET
BASO%: 0.6 % (ref 0.0–2.0)
Basophils Absolute: 0.1 10*3/uL (ref 0.0–0.1)
EOS%: 1.6 % (ref 0.0–7.0)
Eosinophils Absolute: 0.1 10*3/uL (ref 0.0–0.5)
HCT: 43.1 % (ref 34.8–46.6)
HGB: 14.8 g/dL (ref 11.6–15.9)
LYMPH%: 19.8 % (ref 14.0–49.7)
MCH: 33.3 pg (ref 25.1–34.0)
MCHC: 34.3 g/dL (ref 31.5–36.0)
MCV: 97.1 fL (ref 79.5–101.0)
MONO#: 0.4 10*3/uL (ref 0.1–0.9)
MONO%: 5 % (ref 0.0–14.0)
NEUT#: 5.8 10*3/uL (ref 1.5–6.5)
NEUT%: 73 % (ref 38.4–76.8)
Platelets: 219 10*3/uL (ref 145–400)
RBC: 4.44 10*6/uL (ref 3.70–5.45)
RDW: 12.8 % (ref 11.2–14.5)
WBC: 8 10*3/uL (ref 3.9–10.3)
lymph#: 1.6 10*3/uL (ref 0.9–3.3)

## 2014-10-10 LAB — COMPREHENSIVE METABOLIC PANEL (CC13)
ALT: 21 U/L (ref 0–55)
AST: 20 U/L (ref 5–34)
Albumin: 3.8 g/dL (ref 3.5–5.0)
Alkaline Phosphatase: 87 U/L (ref 40–150)
Anion Gap: 8 mEq/L (ref 3–11)
BUN: 12.8 mg/dL (ref 7.0–26.0)
CO2: 26 mEq/L (ref 22–29)
Calcium: 9.3 mg/dL (ref 8.4–10.4)
Chloride: 104 mEq/L (ref 98–109)
Creatinine: 0.8 mg/dL (ref 0.6–1.1)
EGFR: 71 mL/min/{1.73_m2} — ABNORMAL LOW (ref >90)
Glucose: 97 mg/dl (ref 70–140)
Potassium: 4.7 mEq/L (ref 3.5–5.1)
Sodium: 138 mEq/L (ref 136–145)
Total Bilirubin: 0.82 mg/dL (ref 0.20–1.20)
Total Protein: 7 g/dL (ref 6.4–8.3)

## 2014-10-10 NOTE — Progress Notes (Signed)
OFFICE PROGRESS NOTE  CC**  [name], MD [phone] [address] [place] [address] Dr. Thea Silversmith  Dr. Jackolyn Confer  DIAGNOSIS: 74 year old female with new diagnosis of stage I right side invasive ductal carcinoma of the breast. Patient is seen in the multidisciplinary breast clinic  STAGE:  Cancer of upper-outer quadrant of female breast  Primary site: Breast (Right)  Staging method: AJCC 7th Edition  Clinical: (T1b, NX, cM0)  Summary: (T1b, NX, cM0)  PRIOR THERAPY: #1Patient was seen for a six-month recall mammogram for a suspicious right breast nodule. This was at the 9:00 position. Because of this she underwent a repeat diagnostic mammogram performed on 01/05/2013. There was a small nodule that persisted appearing to be most likely irregular and margination. She had an ultrasound that demonstrated a small 3-4 mm nodular density. This appeared suspicious because of this patient underwent a stereotactic biopsy of the 9:00 nodule. The pathology showed an invasive ductal carcinoma with ductal carcinoma in situ. The tumor was grade 2 with associated high-grade DCIS. There was ER positive PR positive HER-2/neu negative with a Ki-67 of 10%. She had MRI of the breasts performed the MRI did not show any abnormal enhancement in the left breast. However in the right breast there was an 8 x 8 x 6 mm enhancing mass in the 9:00 region. There were post biopsy change is noted and biopsy clip was foun  #2 patient has undergone a lumpectomy on 02/01/2013. This revealed a 0.4 cm area of invasive cancer that was grade 1 with associated low-grade DCIS and atypical lobular hyperplasia one sentinel node was negative for metastatic disease. Tumor was ER +100% PR +80%. HER-2/neu negative with a Ki-67 of 50%.   #3 Aromasin 39m daily starting 02/24/2014.   CURRENT THERAPY: Aromasin 25 mg daily  INTERVAL HISTORY: Michelle CAMPANELLA89y.o. female returns for followup visit today of her h/o  invasive ductal carcinoma of the breast.  She was last seen by uKoreapractitioner LMendel Ryder6 months ago. She has been tolerating Aromasin well, no hot flush, she has chronic joint discomfort at hands from RA, mild and stable. No other complains.    MEDICAL HISTORY: Past Medical History  Diagnosis Date  . Hot flashes   . Arthritis   . Anxiety   . Sjogren's disease   . COPD (chronic obstructive pulmonary disease)   . Shortness of breath   . Cough   . Allergy     Pcn, Sulfa, Codeine  . Cataract     surgey b/l  . Depression     hx years ago    ALLERGIES:  is allergic to penicillins; codeine; and sulfa antibiotics.  MEDICATIONS:  Current Outpatient Prescriptions  Medication Sig Dispense Refill  . albuterol (PROVENTIL HFA;VENTOLIN HFA) 108 (90 BASE) MCG/ACT inhaler Inhale 2 puffs into the lungs every 6 (six) hours as needed for wheezing.    .Marland Kitchenazelastine (ASTELIN) 137 MCG/SPRAY nasal spray Place 1 spray into the nose 2 (two) times daily. Use in each nostril as directed    . cholecalciferol (VITAMIN D) 1000 UNITS tablet Take 1,000 Units by mouth daily.    . diclofenac (VOLTAREN) 75 MG EC tablet     . exemestane (AROMASIN) 25 MG tablet Take 1 tablet (25 mg total) by mouth daily after breakfast. 90 tablet 1  . Fluticasone-Salmeterol (ADVAIR) 100-50 MCG/DOSE AEPB Inhale 2 puffs into the lungs every morning.    . montelukast (SINGULAIR) 10 MG tablet Take 10 mg by  mouth at bedtime.     . Multiple Vitamins-Minerals (CENTRUM PO) Take by mouth daily.    Marland Kitchen OVER THE COUNTER MEDICATION     . PARoxetine (PAXIL) 40 MG tablet Take 40 mg by mouth daily.     . vitamin C (ASCORBIC ACID) 250 MG tablet Take 250 mg by mouth daily.    . beta carotene w/minerals (OCUVITE) tablet Take 1 tablet by mouth daily.    . calcium carbonate 200 MG capsule Take 250 mg by mouth daily. Coral calcium 100%    . folic acid (FOLVITE) 1 MG tablet Take 1 mg by mouth daily.  0  . methotrexate (RHEUMATREX) 2.5 MG tablet   0   No  current facility-administered medications for this visit.    SURGICAL HISTORY:  Past Surgical History  Procedure Laterality Date  . Breast surgery Right 1965    biopsy  . Abdominal hysterectomy  74    partial  . Cervical fusion  2010  . Cholecystectomy  2002  . Dilation and curettage of uterus    . Colonoscopy    . Breast lumpectomy with needle localization and axillary sentinel lymph node bx Right 02/01/2013    Procedure: BREAST LUMPECTOMY WITH NEEDLE LOCALIZATION AND AXILLARY SENTINEL LYMPH NODE BX;  Surgeon: Odis Hollingshead, MD;  Location: [facility];  Service: General;  Laterality: Right;    REVIEW OF SYSTEMS:  A 10 point review of systems was conducted and is otherwise negative except for what is noted above.    Health Maintenance Mammogram: 01/2014 Colonoscopy:unsure, thinks it was 1-2 years ago Bone Density Scan: 03/04/13 Pap Smear: does not have anymore, has had TAH Eye Exam: 2015 Vitamin D Level: followed by Dr. Kenton Kingfisher, PCP Lipid Panel:  Followed by Dr. Kenton Kingfisher PCP  PHYSICAL EXAMINATION: Blood pressure 123/65, pulse 77, temperature 98.9 F (37.2 C), temperature source Oral, resp. rate 18, height 5' 1"  (1.549 m), weight 156 lb 11.2 oz (71.079 kg), SpO2 99 %. Body mass index is 29.62 kg/(m^2). GENERAL: Patient is a well appearing female in no acute distress HEENT:  Sclerae anicteric.  Oropharynx clear and moist. No ulcerations or evidence of oropharyngeal candidiasis. Neck is supple.  NODES:  No cervical, supraclavicular, or axillary lymphadenopathy palpated.  BREAST EXAM: small 1 cm soft nodule in upper left medial breast.  Otherwise no masses/skin changes, right breast no nodules or masses.  Lumpectomy without nodularity.  LUNGS:  Clear to auscultation bilaterally.  No wheezes or rhonchi. HEART:  Regular rate and rhythm. No murmur appreciated. ABDOMEN:  Soft, nontender.  Positive, normoactive bowel sounds. No organomegaly palpated. MSK:  No focal spinal  tenderness to palpation. Full range of motion bilaterally in the upper extremities. EXTREMITIES:  No peripheral edema.   SKIN:  Clear with no obvious rashes or skin changes. No nail dyscrasia. NEURO:  Nonfocal. Well oriented.  Appropriate affect. ECOG PERFORMANCE STATUS: 0 - Asymptomatic      LABORATORY DATA: Lab Results  Component Value Date   WBC 8.0 10/10/2014   HGB 14.8 10/10/2014   HCT 43.1 10/10/2014   MCV 97.1 10/10/2014   PLT 219 10/10/2014      Chemistry      Component Value Date/Time   NA 138 10/10/2014 1020   NA 139 09/30/2008 1353   K 4.7 10/10/2014 1020   K 4.3 09/30/2008 1353   CL 100 01/13/2013 0819   CL 103 09/30/2008 1353   CO2 26 10/10/2014 1020   CO2 29 09/30/2008 1353  BUN 12.8 10/10/2014 1020   BUN 9 09/30/2008 1353   CREATININE 0.8 10/10/2014 1020   CREATININE 0.69 09/30/2008 1353      Component Value Date/Time   CALCIUM 9.3 10/10/2014 1020   CALCIUM 9.4 09/30/2008 1353   ALKPHOS 87 10/10/2014 1020   AST 20 10/10/2014 1020   ALT 21 10/10/2014 1020   BILITOT 0.82 10/10/2014 1020       RADIOGRAPHIC STUDIES:  No results found.  ASSESSMENT: 74 year old female with  1.  T1 N0 invasive right breast ductal carcinoma, ER positive PR positive HER-2/neu negative. -She is doing clinically very well. Exam and last mammogram showed no evidence of recurrence. -She is due for mammogram this month. I'll ask our scheduler to schedule for her. -She will continue Aromasin once daily, for total 5 years.   2.Bone health -She has not had bone density scan for several years. I'll schedule one for her in the next months. -I encouraged her take calcium and vitamin D, and regular exercise.  3.  she'll continue follow-up with her primary care physician for other medical problems.   Follow-up: Return to clinic in 6 months with lab.   Truitt Merle  10/10/2014     .    Call

## 2014-10-10 NOTE — Telephone Encounter (Signed)
gv adn printed appt sched and avs for pt for Aug [facility] mamm[facility] and bone density  and Sept

## 2014-10-17 ENCOUNTER — Ambulatory Visit: Payer: Medicare Other | Admitting: Adult Health

## 2014-10-17 ENCOUNTER — Other Ambulatory Visit: Payer: Medicare Other

## 2014-11-07 ENCOUNTER — Other Ambulatory Visit: Payer: Self-pay | Admitting: Family Medicine

## 2014-11-07 ENCOUNTER — Ambulatory Visit
Admission: RE | Admit: 2014-11-07 | Discharge: 2014-11-07 | Disposition: A | Discharge status: Home / Self Care | Payer: PRIVATE HEALTH INSURANCE | Source: Ambulatory Visit | Attending: Family Medicine | Admitting: Family Medicine

## 2014-11-07 DIAGNOSIS — M545 Low back pain: Secondary | ICD-10-CM

## 2014-11-21 ENCOUNTER — Other Ambulatory Visit: Payer: Self-pay | Admitting: Hematology

## 2014-11-21 DIAGNOSIS — C50411 Malignant neoplasm of upper-outer quadrant of right female breast: Secondary | ICD-10-CM

## 2014-11-21 DIAGNOSIS — C50419 Malignant neoplasm of upper-outer quadrant of unspecified female breast: Secondary | ICD-10-CM

## 2014-11-21 NOTE — Telephone Encounter (Signed)
Patient called requesting "refill for exemestane.  Pharmacy has been sending request and you people won't respond.  I didn't know [facility] Massed was no longer there."  Refill observed in Rx request basket sent at this time.

## 2014-12-01 ENCOUNTER — Telehealth: Payer: Self-pay | Admitting: *Deleted

## 2014-12-01 NOTE — Telephone Encounter (Signed)
TC from patient with new c/o nausea when taking Aromasin. She has been on thios for 3 years and had not experienced this before, but now almost every morning gets nauseated after taking it. Vomitied x1. Denies fever, diarrhea or constipation.  She does not have any anti-emetics at home.  Please advise

## 2014-12-02 NOTE — Telephone Encounter (Signed)
I called pt, she has been having nausea after taking Aromasin for the past 3 weeks, no other new complains, she otherwise feels fine. I told her to hold it for 5 days and restart, and take it in different time (after lunch or dinner). If nausea recur, I will switch her to anastrozole or letrozole. She will update me in a week.  Michelle Gonzales  12/02/2014

## 2014-12-06 ENCOUNTER — Other Ambulatory Visit: Payer: Self-pay | Admitting: Orthopaedic Surgery

## 2014-12-06 DIAGNOSIS — S22000A Wedge compression fracture of unspecified thoracic vertebra, initial encounter for closed fracture: Secondary | ICD-10-CM

## 2014-12-12 ENCOUNTER — Ambulatory Visit
Admission: RE | Admit: 2014-12-12 | Discharge: 2014-12-12 | Disposition: A | Discharge status: Home / Self Care | Payer: Medicare Other | Source: Ambulatory Visit | Attending: Orthopaedic Surgery | Admitting: Orthopaedic Surgery

## 2014-12-12 DIAGNOSIS — S22000A Wedge compression fracture of unspecified thoracic vertebra, initial encounter for closed fracture: Secondary | ICD-10-CM

## 2014-12-14 ENCOUNTER — Telehealth: Payer: Self-pay | Admitting: *Deleted

## 2014-12-14 NOTE — Telephone Encounter (Addendum)
Faxed signed order for Diagnostic Mammo w/ U/S if necessary & bone density w/ IVA if necessary to [facility] @ [phone].

## 2015-04-10 ENCOUNTER — Encounter: Payer: Self-pay | Admitting: Hematology

## 2015-04-10 ENCOUNTER — Encounter: Payer: Medicare Other | Admitting: Hematology

## 2015-04-10 NOTE — Progress Notes (Signed)
No show  This encounter was created in error - please disregard.

## 2015-04-11 ENCOUNTER — Telehealth: Payer: Self-pay | Admitting: Hematology

## 2015-04-11 NOTE — Telephone Encounter (Signed)
Spoke with patient re appt for 9/27.

## 2015-04-18 ENCOUNTER — Ambulatory Visit (HOSPITAL_BASED_OUTPATIENT_CLINIC_OR_DEPARTMENT_OTHER): Payer: Medicare Other

## 2015-04-18 ENCOUNTER — Ambulatory Visit (HOSPITAL_BASED_OUTPATIENT_CLINIC_OR_DEPARTMENT_OTHER): Payer: Medicare Other | Admitting: Hematology

## 2015-04-18 ENCOUNTER — Telehealth: Payer: Self-pay | Admitting: Hematology

## 2015-04-18 ENCOUNTER — Encounter: Payer: Self-pay | Admitting: Hematology

## 2015-04-18 DIAGNOSIS — C50411 Malignant neoplasm of upper-outer quadrant of right female breast: Secondary | ICD-10-CM

## 2015-04-18 DIAGNOSIS — Z79811 Long term (current) use of aromatase inhibitors: Secondary | ICD-10-CM | POA: Diagnosis not present

## 2015-04-18 DIAGNOSIS — C50419 Malignant neoplasm of upper-outer quadrant of unspecified female breast: Secondary | ICD-10-CM

## 2015-04-18 DIAGNOSIS — Z170 Estrogen receptor positive status [ER+]: Secondary | ICD-10-CM

## 2015-04-18 DIAGNOSIS — C50811 Malignant neoplasm of overlapping sites of right female breast: Secondary | ICD-10-CM | POA: Diagnosis not present

## 2015-04-18 LAB — CBC WITH DIFFERENTIAL/PLATELET
BASO%: 1.1 % (ref 0.0–2.0)
Basophils Absolute: 0.1 10*3/uL (ref 0.0–0.1)
EOS%: 1.4 % (ref 0.0–7.0)
Eosinophils Absolute: 0.1 10*3/uL (ref 0.0–0.5)
HCT: 43.1 % (ref 34.8–46.6)
HGB: 14.5 g/dL (ref 11.6–15.9)
LYMPH%: 23.2 % (ref 14.0–49.7)
MCH: 31.3 pg (ref 25.1–34.0)
MCHC: 33.7 g/dL (ref 31.5–36.0)
MCV: 92.9 fL (ref 79.5–101.0)
MONO#: 0.6 10*3/uL (ref 0.1–0.9)
MONO%: 7.2 % (ref 0.0–14.0)
NEUT#: 5.9 10*3/uL (ref 1.5–6.5)
NEUT%: 67.1 % (ref 38.4–76.8)
Platelets: 205 10*3/uL (ref 145–400)
RBC: 4.63 10*6/uL (ref 3.70–5.45)
RDW: 12.6 % (ref 11.2–14.5)
WBC: 8.7 10*3/uL (ref 3.9–10.3)
lymph#: 2 10*3/uL (ref 0.9–3.3)

## 2015-04-18 LAB — COMPREHENSIVE METABOLIC PANEL (CC13)
ALT: 16 U/L (ref 0–55)
AST: 19 U/L (ref 5–34)
Albumin: 4 g/dL (ref 3.5–5.0)
Alkaline Phosphatase: 81 U/L (ref 40–150)
Anion Gap: 8 mEq/L (ref 3–11)
BUN: 16.2 mg/dL (ref 7.0–26.0)
CO2: 27 mEq/L (ref 22–29)
Calcium: 9.5 mg/dL (ref 8.4–10.4)
Chloride: 103 mEq/L (ref 98–109)
Creatinine: 0.9 mg/dL (ref 0.6–1.1)
EGFR: 68 mL/min/{1.73_m2} — ABNORMAL LOW (ref >90)
Glucose: 92 mg/dl (ref 70–140)
Potassium: 4.1 mEq/L (ref 3.5–5.1)
Sodium: 138 mEq/L (ref 136–145)
Total Bilirubin: 0.78 mg/dL (ref 0.20–1.20)
Total Protein: 7 g/dL (ref 6.4–8.3)

## 2015-04-18 MED ORDER — EXEMESTANE 25 MG PO TABS: ORAL_TABLET | ORAL | Status: DC

## 2015-04-18 NOTE — Progress Notes (Signed)
Michelle Gonzales, Michelle Gonzales  DIAGNOSIS: 74 year old female with new diagnosis of stage I right side invasive ductal carcinoma of the breast. Patient is seen in the multidisciplinary breast clinic  STAGE:  Cancer of upper-outer quadrant of female breast  Primary site: Breast (Right)  Staging method: AJCC 7th Edition  Clinical: (T1b, NX, cM0)  Summary: (T1b, NX, cM0)  PRIOR THERAPY: #1Patient was seen for a six-month recall mammogram for a suspicious right breast nodule. This was at the 9:00 position. Because of this she underwent a repeat diagnostic mammogram performed on 01/05/2013. There was a small nodule that persisted appearing to be most likely irregular and margination. She had an ultrasound that demonstrated a small 3-4 mm nodular density. This appeared suspicious because of this patient underwent a stereotactic biopsy of the 9:00 nodule. The pathology showed an invasive ductal carcinoma with ductal carcinoma in situ. The tumor was grade 2 with associated high-grade DCIS. There was ER positive PR positive HER-2/neu negative with a Ki-67 of 10%. She had MRI of the breasts performed the MRI did not show any abnormal enhancement in the left breast. However in the right breast there was an 8 x 8 x 6 mm enhancing mass in the 9:00 region. There were post biopsy change is noted and biopsy clip was foun  #2 patient has undergone a lumpectomy on 02/01/2013. This revealed a 0.4 cm area of invasive cancer that was grade 1 with associated low-grade DCIS and atypical lobular hyperplasia one sentinel node was negative for metastatic disease. Tumor was ER +100% PR +80%. HER-2/neu negative with a Ki-67 of 50%.   #3 Aromasin 22m daily starting 02/24/2013.   CURRENT THERAPY: Aromasin 25 mg daily  INTERVAL HISTORY: Michelle KHATOON89y.o. female returns for followup  visit today of her h/o invasive ductal carcinoma of the breast.  She was last seen by me 6 months ago. She is doing well overall. She has some nausea, which resolved after she stopped her medication for arthritis. She is off Paxil also.  She is tolerating Aromasin very well, no noticeable side effects. She had a mammogram and a [facility]  MEDICAL HISTORY: Past Medical History  Diagnosis Date  . Hot flashes   . Arthritis   . Anxiety   . Sjogren's disease   . COPD (chronic obstructive pulmonary disease)   . Shortness of breath   . Cough   . Allergy     Pcn, Sulfa, Codeine  . Cataract     surgey b/l  . Depression     hx years ago    ALLERGIES:  is allergic to penicillins; codeine; and sulfa antibiotics.  MEDICATIONS:  Current Outpatient Prescriptions  Medication Sig Dispense Refill  . azelastine (ASTELIN) 137 MCG/SPRAY nasal spray Place 1 spray into the nose 2 (two) times daily. Use in each nostril as directed    . beta carotene w/minerals (OCUVITE) tablet Take 1 tablet by mouth daily.    . cholecalciferol (VITAMIN D) 1000 UNITS tablet Take 1,000 Units by mouth daily.    .Marland Kitchenexemestane (AROMASIN) 25 MG tablet take 1 tablet by mouth once daily AFTER BREAKFAST 90 tablet 1  . Fluticasone-Salmeterol (ADVAIR) 100-50 MCG/DOSE AEPB Inhale 2 puffs into the lungs every morning.    . montelukast (SINGULAIR) 10 MG tablet Take 10 mg by  mouth at bedtime.     . Multiple Vitamins-Minerals (CENTRUM PO) Take by mouth daily.    Marland Kitchen OVER THE COUNTER MEDICATION 1 tablet daily. Osteo biflex    . vitamin C (ASCORBIC ACID) 250 MG tablet Take 250 mg by mouth daily.     No current facility-administered medications for this visit.    SURGICAL HISTORY:  Past Surgical History  Procedure Laterality Date  . Breast surgery Right 1965    biopsy  . Abdominal hysterectomy  74    partial  . Cervical fusion  2010  . Cholecystectomy  2002  . Dilation and curettage of uterus     . Colonoscopy    . Breast lumpectomy with needle localization and axillary sentinel lymph node bx Right 02/01/2013    Procedure: BREAST LUMPECTOMY WITH NEEDLE LOCALIZATION AND AXILLARY SENTINEL LYMPH NODE BX;  Surgeon: Odis Hollingshead, Michelle;  Location: [facility];  Service: General;  Laterality: Right;    REVIEW OF SYSTEMS:  A 10 point review of systems was conducted and is otherwise negative except for what is noted above.    Health Maintenance Mammogram: 01/2015 Colonoscopy:unsure, thinks it was 1-2 years ago Bone Density Scan: 8/16 Pap Smear: does not have anymore, has had TAH Eye Exam: 2015 Vitamin D Level: followed by Dr. Kenton Kingfisher, PCP Lipid Panel:  Followed by Dr. Kenton Kingfisher PCP  PHYSICAL EXAMINATION: There were no vitals taken for this visit. There is no weight on file to calculate BMI. GENERAL: Patient is a well appearing female in no acute distress HEENT:  Sclerae anicteric.  Oropharynx clear and moist. No ulcerations or evidence of oropharyngeal candidiasis. Neck is supple.  NODES:  No cervical, supraclavicular, or axillary lymphadenopathy palpated.  BREAST EXAM: small 1 cm soft nodule in upper left medial breast.  Otherwise no masses/skin changes, right breast no nodules or masses.  Lumpectomy without nodularity.  LUNGS:  Clear to auscultation bilaterally.  No wheezes or rhonchi. HEART:  Regular rate and rhythm. No murmur appreciated. ABDOMEN:  Soft, nontender.  Positive, normoactive bowel sounds. No organomegaly palpated. MSK:  No focal spinal tenderness to palpation. Full range of motion bilaterally in the upper extremities. EXTREMITIES:  No peripheral edema.   SKIN:  Clear with no obvious rashes or skin changes. No nail dyscrasia. NEURO:  Nonfocal. Well oriented.  Appropriate affect. ECOG PERFORMANCE STATUS: 0 - Asymptomatic      LABORATORY DATA: CBC Latest Ref Rng 10/10/2014 04/18/2014 10/01/2013  WBC 3.9 - 10.3 10e3/uL 8.0 9.5 10.3  Hemoglobin 11.6 - 15.9  g/dL 14.8 13.9 13.7  Hematocrit 34.8 - 46.6 % 43.1 41.8 40.8  Platelets 145 - 400 10e3/uL 219 203 207    CMP Latest Ref Rng 10/10/2014 04/18/2014 10/01/2013  Glucose 70 - 140 mg/dl 97 84 158(H)  BUN 7.0 - 26.0 mg/dL 12.8 15.5 13.6  Creatinine 0.6 - 1.1 mg/dL 0.8 0.9 0.8  Sodium 136 - 145 mEq/L 138 137 135(L)  Potassium 3.5 - 5.1 mEq/L 4.7 4.2 3.8  Chloride 98 - 107 mEq/L - - -  CO2 22 - 29 mEq/L _0 Calcium 8.4 - 10.4 mg/dL 9.3 9.6 9.3  Total Protein 6.4 - 8.3 g/dL 7.0 7.2 6.8  Total Bilirubin 0.20 - 1.20 mg/dL 0.82 0.58 0.39  Alkaline Phos 40 - 150 U/L 87 81 81  AST 5 - 34 U/L _1 ALT 0 - 55 U/L 21 32 15        RADIOGRAPHIC STUDIES:  No results  found.  ASSESSMENT: 74 year old female with  1.  T1 N0 invasive right breast ductal carcinoma, ER positive PR positive HER-2/neu negative. -She is clinically doing very well, physical exam was unremarkable. No evidence of recurrence -She had repeat a mammogram a few months ago at [place], [place] request a copy from [place].  -She  is tolerating Aromasin very well, we'll continue for total 5 years, until August 2019. -She'll continue annual mammogram, I encouraged her having has a diet and exercise regularly.   2.Bone health -She had bone density scan done a few months ago at [facility], I'll request a copy -I encouraged her to continue taking calcium and vitamin D, and regular exercise. -I discussed the potential side effects of osteoporosis from Aromasin  3.  she'll continue follow-up with her primary care physician for other medical problems.   Follow-up: Return to clinic in 6 months with lab. We'll repeat CBC and CMP today also.  Truitt Merle  04/18/2015     .

## 2015-04-18 NOTE — Telephone Encounter (Signed)
Pt did not want printed out.Michelle KitchenMarland Gonzales

## 2015-10-17 ENCOUNTER — Telehealth: Payer: Self-pay | Admitting: Hematology

## 2015-10-17 ENCOUNTER — Ambulatory Visit (HOSPITAL_BASED_OUTPATIENT_CLINIC_OR_DEPARTMENT_OTHER): Payer: Medicare Other | Admitting: Hematology

## 2015-10-17 ENCOUNTER — Encounter: Payer: Self-pay | Admitting: Hematology

## 2015-10-17 ENCOUNTER — Other Ambulatory Visit (HOSPITAL_BASED_OUTPATIENT_CLINIC_OR_DEPARTMENT_OTHER): Payer: Medicare Other

## 2015-10-17 VITALS — BP 139/76 | HR 80 | Temp 98.2°F | Resp 18 | Wt 148.5 lb

## 2015-10-17 DIAGNOSIS — C50811 Malignant neoplasm of overlapping sites of right female breast: Secondary | ICD-10-CM

## 2015-10-17 DIAGNOSIS — Z79811 Long term (current) use of aromatase inhibitors: Secondary | ICD-10-CM | POA: Diagnosis not present

## 2015-10-17 DIAGNOSIS — G8929 Other chronic pain: Secondary | ICD-10-CM | POA: Diagnosis not present

## 2015-10-17 DIAGNOSIS — C50411 Malignant neoplasm of upper-outer quadrant of right female breast: Secondary | ICD-10-CM

## 2015-10-17 DIAGNOSIS — C50419 Malignant neoplasm of upper-outer quadrant of unspecified female breast: Secondary | ICD-10-CM

## 2015-10-17 DIAGNOSIS — Z170 Estrogen receptor positive status [ER+]: Secondary | ICD-10-CM | POA: Diagnosis not present

## 2015-10-17 LAB — COMPREHENSIVE METABOLIC PANEL
ALT: 22 U/L (ref 0–55)
AST: 23 U/L (ref 5–34)
Albumin: 3.6 g/dL (ref 3.5–5.0)
Alkaline Phosphatase: 87 U/L (ref 40–150)
Anion Gap: 6 mEq/L (ref 3–11)
BUN: 13.5 mg/dL (ref 7.0–26.0)
CO2: 29 mEq/L (ref 22–29)
Calcium: 9.2 mg/dL (ref 8.4–10.4)
Chloride: 104 mEq/L (ref 98–109)
Creatinine: 0.8 mg/dL (ref 0.6–1.1)
EGFR: 73 mL/min/{1.73_m2} — ABNORMAL LOW (ref >90)
Glucose: 95 mg/dl (ref 70–140)
Potassium: 4.3 mEq/L (ref 3.5–5.1)
Sodium: 138 mEq/L (ref 136–145)
Total Bilirubin: 0.71 mg/dL (ref 0.20–1.20)
Total Protein: 7.1 g/dL (ref 6.4–8.3)

## 2015-10-17 LAB — CBC WITH DIFFERENTIAL/PLATELET
BASO%: 0.9 % (ref 0.0–2.0)
Basophils Absolute: 0.1 10*3/uL (ref 0.0–0.1)
EOS%: 2 % (ref 0.0–7.0)
Eosinophils Absolute: 0.2 10*3/uL (ref 0.0–0.5)
HCT: 43.2 % (ref 34.8–46.6)
HGB: 14.3 g/dL (ref 11.6–15.9)
LYMPH%: 26.5 % (ref 14.0–49.7)
MCH: 30.8 pg (ref 25.1–34.0)
MCHC: 33 g/dL (ref 31.5–36.0)
MCV: 93.3 fL (ref 79.5–101.0)
MONO#: 0.6 10*3/uL (ref 0.1–0.9)
MONO%: 7.2 % (ref 0.0–14.0)
NEUT#: 5.6 10*3/uL (ref 1.5–6.5)
NEUT%: 63.4 % (ref 38.4–76.8)
Platelets: 181 10*3/uL (ref 145–400)
RBC: 4.63 10*6/uL (ref 3.70–5.45)
RDW: 12.8 % (ref 11.2–14.5)
WBC: 8.8 10*3/uL (ref 3.9–10.3)
lymph#: 2.3 10*3/uL (ref 0.9–3.3)

## 2015-10-17 MED ORDER — EXEMESTANE 25 MG PO TABS
ORAL_TABLET | ORAL | Status: DC
Start: 2015-10-17 — End: 2016-04-18

## 2015-10-17 NOTE — Progress Notes (Signed)
[facility] NOTE  CC  Michelle Frees, MD [address] [place] [address] Dr. Thea Silversmith  Dr. Jackolyn Confer  DIAGNOSIS: 75 year old female with new diagnosis of stage I right side invasive ductal carcinoma of the breast. Patient is seen in the multidisciplinary breast clinic  STAGE:  Cancer of upper-outer quadrant of female breast  Primary site: Breast (Right)  Staging method: AJCC 7th Edition  Clinical: (T1b, NX, cM0)  Summary: (T1b, NX, cM0)  PRIOR THERAPY: #1Patient was seen for a six-month recall mammogram for a suspicious right breast nodule. This was at the 9:00 position. Because of this she underwent a repeat diagnostic mammogram performed on 01/05/2013. There was a small nodule that persisted appearing to be most likely irregular and margination. She had an ultrasound that demonstrated a small 3-4 mm nodular density. This appeared suspicious because of this patient underwent a stereotactic biopsy of the 9:00 nodule. The pathology showed an invasive ductal carcinoma with ductal carcinoma in situ. The tumor was grade 2 with associated high-grade DCIS. There was ER positive PR positive HER-2/neu negative with a Ki-67 of 10%. She had MRI of the breasts performed the MRI did not show any abnormal enhancement in the left breast. However in the right breast there was an 8 x 8 x 6 mm enhancing mass in the 9:00 region. There were post biopsy change is noted and biopsy clip was foun  #2 patient has undergone a lumpectomy on 02/01/2013. This revealed a 0.4 cm area of invasive cancer that was grade 1 with associated low-grade DCIS and atypical lobular hyperplasia one sentinel node was negative for metastatic disease. Tumor was ER +100% PR +80%. HER-2/neu negative with a Ki-67 of 50%.   #3 Aromasin 57m daily starting 02/24/2013.   CURRENT THERAPY: Aromasin 25 mg daily  INTERVAL HISTORY: Michelle GUTIERREZ89y.o. female returns for followup  visit today of her h/o invasive ductal carcinoma of the breast.  She was last seen by me 6 months ago. She is doing well overall. She complains of mild to moderate fatigue, and diffuse muscle achiness, which she has had for at least 2-3 years. She initially saw her was  Pain in her bones, and she stopped calcium, but it did not improve much. She has resumed calcium and takes one tablet every other day. She is able to function well at home, but does not feel she has enough energy to do much else. She otherwise tolerating exemestane well, denies significant hot flashes or other side effects. No other new complaints.  MEDICAL HISTORY: Past Medical History  Diagnosis Date  . Hot flashes   . Arthritis   . Anxiety   . Sjogren's disease ([facility]   . COPD (chronic obstructive pulmonary disease) ([facility]   . Shortness of breath   . Cough   . Allergy     Pcn, Sulfa, Codeine  . Cataract     surgey b/l  . Depression     hx years ago    ALLERGIES:  is allergic to demerol; penicillins; codeine; and sulfa antibiotics.  MEDICATIONS:  Current Outpatient Prescriptions  Medication Sig Dispense Refill  . azelastine (ASTELIN) 137 MCG/SPRAY nasal spray Place 1 spray into the nose 2 (two) times daily. Use in each nostril as directed    . beta carotene w/minerals (OCUVITE) tablet Take 1 tablet by mouth daily.    . cholecalciferol (VITAMIN D) 1000 UNITS tablet Take 1,000 Units by mouth daily.    .Marland Kitchen  exemestane (AROMASIN) 25 MG tablet take 1 tablet by mouth once daily AFTER BREAKFAST 90 tablet 1  . Fluticasone-Salmeterol (ADVAIR) 100-50 MCG/DOSE AEPB Inhale 2 puffs into the lungs every morning.    . montelukast (SINGULAIR) 10 MG tablet Take 10 mg by mouth at bedtime.     . Multiple Vitamins-Minerals (CENTRUM PO) Take by mouth daily.    Marland Kitchen OVER THE COUNTER MEDICATION 1 tablet daily. Osteo biflex    . vitamin C (ASCORBIC ACID) 250 MG tablet Take 250 mg by mouth daily.     No current facility-administered medications  for this visit.    SURGICAL HISTORY:  Past Surgical History  Procedure Laterality Date  . Breast surgery Right 1965    biopsy  . Abdominal hysterectomy  74    partial  . Cervical fusion  2010  . Cholecystectomy  2002  . Dilation and curettage of uterus    . Colonoscopy    . Breast lumpectomy with needle localization and axillary sentinel lymph node bx Right 02/01/2013    Procedure: BREAST LUMPECTOMY WITH NEEDLE LOCALIZATION AND AXILLARY SENTINEL LYMPH NODE BX;  Surgeon: Odis Hollingshead, MD;  Location: [facility];  Service: General;  Laterality: Right;    REVIEW OF SYSTEMS:  A 10 point review of systems was conducted and is otherwise negative except for what is noted above.    Health Maintenance Mammogram: 01/2015 Colonoscopy:unsure, thinks it was 1-2 years ago Bone Density Scan: 8/16 Pap Smear: does not have anymore, has had TAH Eye Exam: 2015 Vitamin D Level: followed by Dr. Kenton Kingfisher, PCP Lipid Panel:  Followed by Dr. Kenton Kingfisher PCP  PHYSICAL EXAMINATION: Blood pressure 139/76, pulse 80, temperature 98.2 F (36.8 C), temperature source Oral, resp. rate 18, weight 148 lb 8 oz (67.359 kg), SpO2 100 %. Body mass index is 28.07 kg/(m^2). GENERAL: Patient is a well appearing female in no acute distress HEENT:  Sclerae anicteric.  Oropharynx clear and moist. No ulcerations or evidence of oropharyngeal candidiasis. Neck is supple.  NODES:  No cervical, supraclavicular, or axillary lymphadenopathy palpated.  BREAST EXAM: small 1 cm soft nodule in upper left medial breast.  Otherwise no masses/skin changes, right breast no nodules or masses.  Lumpectomy without nodularity.  LUNGS:  Clear to auscultation bilaterally.  No wheezes or rhonchi. HEART:  Regular rate and rhythm. No murmur appreciated. ABDOMEN:  Soft, nontender.  Positive, normoactive bowel sounds. No organomegaly palpated. MSK:  No focal spinal tenderness to palpation. Full range of motion bilaterally in the upper  extremities. EXTREMITIES:  No peripheral edema.   SKIN:  Clear with no obvious rashes or skin changes. No nail dyscrasia. NEURO:  Nonfocal. Well oriented.  Appropriate affect. ECOG PERFORMANCE STATUS: 1    LABORATORY DATA: CBC Latest Ref Rng 10/17/2015 04/18/2015 10/10/2014  WBC 3.9 - 10.3 10e3/uL 8.8 8.7 8.0  Hemoglobin 11.6 - 15.9 g/dL 14.3 14.5 14.8  Hematocrit 34.8 - 46.6 % 43.2 43.1 43.1  Platelets 145 - 400 10e3/uL 181 205 219    CMP Latest Ref Rng 10/17/2015 04/18/2015 10/10/2014  Glucose 70 - 140 mg/dl 95 92 97  BUN 7.0 - 26.0 mg/dL 13.5 16.2 12.8  Creatinine 0.6 - 1.1 mg/dL 0.8 0.9 0.8  Sodium 136 - 145 mEq/L 138 138 138  Potassium 3.5 - 5.1 mEq/L 4.3 4.1 4.7  CO2 22 - 29 mEq/L 29 27 26   Calcium 8.4 - 10.4 mg/dL 9.2 9.5 9.3  Total Protein 6.4 - 8.3 g/dL 7.1 7.0 7.0  Total Bilirubin  0.20 - 1.20 mg/dL 0.71 0.78 0.82  Alkaline Phos 40 - 150 U/L 87 81 87  AST 5 - 34 U/L 23 19 20   ALT 0 - 55 U/L 22 16 21      RADIOGRAPHIC STUDIES:  Bone density scan 03/06/2015 Normal  Bilateral 3-D mammogram diagnostic 03/06/2015 There is no mammographic evidence of malignancy. Routine mammographic evaluation in 1 year is recommended.  ASSESSMENT: 75 year old female with  1.  T1 N0 invasive right breast ductal carcinoma, ER positive PR positive HER-2/neu negative. -She is clinically doing very well, physical exam was unremarkable. No evidence of recurrence -lab results were reviewed with her, CBC and CMP all within normal limits. Her last mammogram in August 2016 was normal. -She  is tolerating Aromasin very well, we'll continue for total 5 years, until August 2019. -she does have diffuse muscular pain, not sure is related to exemestane. We discussed the option of holding exemestane for a few months, to see if her muscle pain improves, or change to tamoxifen. She states her muscle pain is tolerable, and we'll continue exemestane for now. -She'll continue annual mammogram, I encouraged her  having has a diet and exercise regularly.   2.Bone health -She had bone density scan in August 2016 which was normal -I encouraged her to continue taking calcium (she is on 3 times a week) and vitamin D, and regular exercise. -I discussed the potential side effects of osteoporosis from Aromasin  3. Diffuse muscular pain -mild chronic, possible related to Aromasin  -If it gets worse, I'll consider holding Aromasin temporary, and a switch to tamoxifen. -I encouraged her to exercise, which may likely improve the pain.  4.  she'll continue follow-up with her primary care physician for other medical problems.   Follow-up: Return to clinic in 6 months with lab. We'll repeat CBC and CMP on next visit.  Truitt Merle  10/17/2015     .

## 2015-10-17 NOTE — Telephone Encounter (Signed)
per pof to sch pt appt-sch pt mamma-gave pt copy of avs °

## 2015-10-25 ENCOUNTER — Telehealth: Payer: Self-pay | Admitting: *Deleted

## 2015-10-25 MED ORDER — TAMOXIFEN CITRATE 20 MG PO TABS: 20.0000 mg | ORAL_TABLET | Freq: Every day | ORAL | Status: DC

## 2015-10-25 NOTE — Telephone Encounter (Signed)
Left message at home # to call back to discuss medications.  Per Dr Burr Medico, continue to hold exemestane & don't start tamoxifen until muscle aches gone.  Tamoxifen script sent to pharmacy electronically.

## 2015-10-25 NOTE — Telephone Encounter (Signed)
Received call from pt stating that she has had muscle aches from the exemestane & she hasn't taken since Sunday.  She would like to try something different & states that this was discussed with Dr Burr Medico at last visit.  Message to Dr Burr Medico.

## 2015-10-30 ENCOUNTER — Telehealth: Payer: Self-pay | Admitting: *Deleted

## 2015-10-30 NOTE — Telephone Encounter (Signed)
Spoke with pt and was informed that pt had started taking Tamoxifen on Sat 10/28/15.   So far pt tolerated medication fine, no side effects noted.  Instructed pt to push po fluids as tolerated.  Pt understood to call office back with any new problems with Tamoxifen.

## 2015-12-18 ENCOUNTER — Other Ambulatory Visit: Payer: Self-pay | Admitting: Hematology

## 2016-02-13 ENCOUNTER — Other Ambulatory Visit: Payer: Self-pay | Admitting: Hematology

## 2016-03-06 ENCOUNTER — Encounter: Payer: Self-pay | Admitting: Hematology

## 2016-04-18 ENCOUNTER — Encounter: Payer: Self-pay | Admitting: Hematology

## 2016-04-18 ENCOUNTER — Other Ambulatory Visit (HOSPITAL_BASED_OUTPATIENT_CLINIC_OR_DEPARTMENT_OTHER): Payer: Medicare Other

## 2016-04-18 ENCOUNTER — Telehealth: Payer: Self-pay | Admitting: Hematology

## 2016-04-18 ENCOUNTER — Ambulatory Visit (HOSPITAL_BASED_OUTPATIENT_CLINIC_OR_DEPARTMENT_OTHER): Payer: Medicare Other | Admitting: Hematology

## 2016-04-18 VITALS — BP 134/78 | HR 85 | Temp 99.1°F | Resp 18 | Ht 61.0 in | Wt 151.7 lb

## 2016-04-18 DIAGNOSIS — C50811 Malignant neoplasm of overlapping sites of right female breast: Secondary | ICD-10-CM | POA: Diagnosis not present

## 2016-04-18 DIAGNOSIS — C50411 Malignant neoplasm of upper-outer quadrant of right female breast: Secondary | ICD-10-CM

## 2016-04-18 DIAGNOSIS — Z79810 Long term (current) use of selective estrogen receptor modulators (SERMs): Secondary | ICD-10-CM | POA: Diagnosis not present

## 2016-04-18 DIAGNOSIS — Z170 Estrogen receptor positive status [ER+]: Secondary | ICD-10-CM

## 2016-04-18 LAB — COMPREHENSIVE METABOLIC PANEL
ALT: 17 U/L (ref 0–55)
AST: 18 U/L (ref 5–34)
Albumin: 3.5 g/dL (ref 3.5–5.0)
Alkaline Phosphatase: 78 U/L (ref 40–150)
Anion Gap: 10 mEq/L (ref 3–11)
BUN: 10 mg/dL (ref 7.0–26.0)
CO2: 26 mEq/L (ref 22–29)
Calcium: 9 mg/dL (ref 8.4–10.4)
Chloride: 102 mEq/L (ref 98–109)
Creatinine: 0.8 mg/dL (ref 0.6–1.1)
EGFR: 75 mL/min/{1.73_m2} — ABNORMAL LOW (ref >90)
Glucose: 94 mg/dl (ref 70–140)
Potassium: 4.2 mEq/L (ref 3.5–5.1)
Sodium: 138 mEq/L (ref 136–145)
Total Bilirubin: 0.58 mg/dL (ref 0.20–1.20)
Total Protein: 7.2 g/dL (ref 6.4–8.3)

## 2016-04-18 LAB — CBC WITH DIFFERENTIAL/PLATELET
BASO%: 1 % (ref 0.0–2.0)
Basophils Absolute: 0.1 10*3/uL (ref 0.0–0.1)
EOS%: 1.9 % (ref 0.0–7.0)
Eosinophils Absolute: 0.2 10*3/uL (ref 0.0–0.5)
HCT: 43 % (ref 34.8–46.6)
HGB: 14.2 g/dL (ref 11.6–15.9)
LYMPH%: 26.7 % (ref 14.0–49.7)
MCH: 30.4 pg (ref 25.1–34.0)
MCHC: 33 g/dL (ref 31.5–36.0)
MCV: 92.3 fL (ref 79.5–101.0)
MONO#: 0.5 10*3/uL (ref 0.1–0.9)
MONO%: 5.4 % (ref 0.0–14.0)
NEUT#: 6.6 10*3/uL — ABNORMAL HIGH (ref 1.5–6.5)
NEUT%: 65 % (ref 38.4–76.8)
Platelets: 173 10*3/uL (ref 145–400)
RBC: 4.66 10*6/uL (ref 3.70–5.45)
RDW: 12.8 % (ref 11.2–14.5)
WBC: 10.1 10*3/uL (ref 3.9–10.3)
lymph#: 2.7 10*3/uL (ref 0.9–3.3)

## 2016-04-18 MED ORDER — TAMOXIFEN CITRATE 20 MG PO TABS: 20.0000 mg | ORAL_TABLET | Freq: Every day | ORAL | 5 refills | Status: DC

## 2016-04-18 NOTE — Progress Notes (Signed)
[facility] NOTE  CC  [name], MD [address] [address] [place] [address] Dr. Thea Silversmith  Dr. Jackolyn Confer  DIAGNOSIS: 75 year old female with new diagnosis of stage I right side invasive ductal carcinoma of the breast. Patient is seen in the multidisciplinary breast clinic  STAGE:  Cancer of upper-outer quadrant of female breast  Primary site: Breast (Right)  Staging method: AJCC 7th Edition  Clinical: (T1b, NX, cM0)  Summary: (T1b, NX, cM0)  PRIOR THERAPY: #1Patient was seen for a six-month recall mammogram for a suspicious right breast nodule. This was at the 9:00 position. Because of this she underwent a repeat diagnostic mammogram performed on 01/05/2013. There was a small nodule that persisted appearing to be most likely irregular and margination. She had an ultrasound that demonstrated a small 3-4 mm nodular density. This appeared suspicious because of this patient underwent a stereotactic biopsy of the 9:00 nodule. The pathology showed an invasive ductal carcinoma with ductal carcinoma in situ. The tumor was grade 2 with associated high-grade DCIS. There was ER positive PR positive HER-2/neu negative with a Ki-67 of 10%. She had MRI of the breasts performed the MRI did not show any abnormal enhancement in the left breast. However in the right breast there was an 8 x 8 x 6 mm enhancing mass in the 9:00 region. There were post biopsy change is noted and biopsy clip was foun  #2 patient has undergone a lumpectomy on 02/01/2013. This revealed a 0.4 cm area of invasive cancer that was grade 1 with associated low-grade DCIS and atypical lobular hyperplasia one sentinel node was negative for metastatic disease. Tumor was ER +100% PR +80%. HER-2/neu negative with a Ki-67 of 50%.   #3 Aromasin 26m daily starting 02/24/2013, changed to tamoxifen in July 2017 due to muscular achiness.   CURRENT THERAPY: Tamoxifen 20 mg once daily,  started in July 2017.  INTERVAL HISTORY: Michelle LITTMAN89y.o. female returns for followup visit today of her h/o invasive ductal carcinoma of the breast.  She was last seen by me 6 months ago. Due to her muscular achiness, her Aromasin was changed to tamoxifen in July 2017. She has been tolerating tamoxifen much better, muscle achiness has resolved, no joint discomfort. No hot flashes or other side effects. She remains to be physically active, does yoga 3 times a week, does housework, etc. she has good appetite and energy level, no other complaints.  MEDICAL HISTORY: Past Medical History:  Diagnosis Date  . Allergy    Pcn, Sulfa, Codeine  . Anxiety   . Arthritis   . Cataract    surgey b/l  . COPD (chronic obstructive pulmonary disease) ([facility]   . Cough   . Depression    hx years ago  . Hot flashes   . Shortness of breath   . Sjogren's disease ([facility]     ALLERGIES:  is allergic to demerol [meperidine]; penicillins; codeine; and sulfa antibiotics.  MEDICATIONS:  Current Outpatient Prescriptions  Medication Sig Dispense Refill  . azelastine (ASTELIN) 137 MCG/SPRAY nasal spray Place 1 spray into the nose 2 (two) times daily. Use in each nostril as directed    . beta carotene w/minerals (OCUVITE) tablet Take 1 tablet by mouth daily.    . cholecalciferol (VITAMIN D) 1000 UNITS tablet Take 1,000 Units by mouth daily.    .Marland Kitchenexemestane (AROMASIN) 25 MG tablet take 1 tablet by mouth once daily AFTER BREAKFAST 90 tablet 1  .  Fluticasone-Salmeterol (ADVAIR) 100-50 MCG/DOSE AEPB Inhale 2 puffs into the lungs every morning.    . montelukast (SINGULAIR) 10 MG tablet Take 10 mg by mouth at bedtime.     . Multiple Vitamins-Minerals (CENTRUM PO) Take by mouth daily.    Marland Kitchen OVER THE COUNTER MEDICATION 1 tablet daily. Osteo biflex    . tamoxifen (NOLVADEX) 20 MG tablet Take 1 tablet (20 mg total) by mouth daily. 30 tablet 5  . vitamin C (ASCORBIC ACID) 250 MG tablet Take 250 mg by mouth daily.     No  current facility-administered medications for this visit.     SURGICAL HISTORY:  Past Surgical History:  Procedure Laterality Date  . ABDOMINAL HYSTERECTOMY  74   partial  . BREAST LUMPECTOMY WITH NEEDLE LOCALIZATION AND AXILLARY SENTINEL LYMPH NODE BX Right 02/01/2013   Procedure: BREAST LUMPECTOMY WITH NEEDLE LOCALIZATION AND AXILLARY SENTINEL LYMPH NODE BX;  Surgeon: Odis Hollingshead, MD;  Location: [facility];  Service: General;  Laterality: Right;  . BREAST SURGERY Right 1965   biopsy  . CERVICAL FUSION  2010  . CHOLECYSTECTOMY  2002  . COLONOSCOPY    . DILATION AND CURETTAGE OF UTERUS      REVIEW OF SYSTEMS:  A 10 point review of systems was conducted and is otherwise negative except for what is noted above.    Health Maintenance Mammogram: 01/2015 Colonoscopy:unsure, thinks it was 1-2 years ago Bone Density Scan: 8/16 Pap Smear: does not have anymore, has had TAH Eye Exam: 2015 Vitamin D Level: followed by Dr. Kenton Kingfisher, PCP Lipid Panel:  Followed by Dr. Kenton Kingfisher PCP  PHYSICAL EXAMINATION: Blood pressure 134/78, pulse 85, temperature 99.1 F (37.3 C), temperature source Oral, resp. rate 18, height _0  (1.549 m), weight 151 lb 11.2 oz (68.8 kg), SpO2 99%. Body mass index is 28.66 kg/m. GENERAL: Patient is a well appearing female in no acute distress HEENT:  Sclerae anicteric.  Oropharynx clear and moist. No ulcerations or evidence of oropharyngeal candidiasis. Neck is supple.  NODES:  No cervical, supraclavicular, or axillary lymphadenopathy palpated.  BREAST EXAM: small 1 cm soft nodule in upper left medial breast.  Otherwise no masses/skin changes, right breast no nodules or masses.  Lumpectomy without nodularity.  LUNGS:  Clear to auscultation bilaterally.  No wheezes or rhonchi. HEART:  Regular rate and rhythm. No murmur appreciated. ABDOMEN:  Soft, nontender.  Positive, normoactive bowel sounds. No organomegaly palpated. MSK:  No focal spinal tenderness  to palpation. Full range of motion bilaterally in the upper extremities. EXTREMITIES:  No peripheral edema.   SKIN:  Clear with no obvious rashes or skin changes. No nail dyscrasia. NEURO:  Nonfocal. Well oriented.  Appropriate affect. ECOG PERFORMANCE STATUS: 1    LABORATORY DATA: CBC Latest Ref Rng & Units 04/18/2016 10/17/2015 04/18/2015  WBC 3.9 - 10.3 10e3/uL 10.1 8.8 8.7  Hemoglobin 11.6 - 15.9 g/dL 14.2 14.3 14.5  Hematocrit 34.8 - 46.6 % 43.0 43.2 43.1  Platelets 145 - 400 10e3/uL 173 181 205    CMP Latest Ref Rng & Units 04/18/2016 10/17/2015 04/18/2015  Glucose 70 - 140 mg/dl 94 95 92  BUN 7.0 - 26.0 mg/dL 10.0 13.5 16.2  Creatinine 0.6 - 1.1 mg/dL 0.8 0.8 0.9  Sodium 136 - 145 mEq/L 138 138 138  Potassium 3.5 - 5.1 mEq/L 4.2 4.3 4.1  Chloride 98 - 107 mEq/L - - -  CO2 22 - 29 mEq/L _1 Calcium 8.4 - 10.4 mg/dL 9.0  9.2 9.5  Total Protein 6.4 - 8.3 g/dL 7.2 7.1 7.0  Total Bilirubin 0.20 - 1.20 mg/dL 0.58 0.71 0.78  Alkaline Phos 40 - 150 U/L 78 87 81  AST 5 - 34 U/L _0 ALT 0 - 55 U/L _1 RADIOGRAPHIC STUDIES:  Bone density scan 03/06/2015 Normal  Bilateral 3-D mammogram diagnostic 03/06/2015 There is no mammographic evidence of malignancy. Routine mammographic evaluation in 1 year is recommended.  ASSESSMENT: 75 year old female with  1.  Breast cancer of upper outer quadrant of right breast, invasive ductal carcinoma, pT1aN0M0, stage IA, ER positive PR positive HER-2/neu negative. -She is clinically doing very well, physical exam was unremarkable. No evidence of recurrence -lab results were reviewed with her, CBC and CMP all within normal limits.  -She had her screening mammogram in July 2017 at [facility], the reported is not in her chart yet. Per patient, mammogram and US showed a intramammary lymph node, but otherwise negative. 6 month follow-up was recommended. -She had Aromasin for 3 years, due to the diffuse muscular achiness, he was switched to  tamoxifen. She is tolerating tamoxifen very well, I recommend her to have it for 5 years. Potential side effects from tamoxifen, especially risk of thrombosis and endometrial cancer were discussed with her. -We'll continue breast cancer surveillance. She will have a close follow-up mammogram in 4 months. I encouraged her to do self exam, and I'll see her back in 6 months. -I encouraged her to continue healthy diet, remain to be physically active, and continue exercise.  2.Bone health -She had bone density scan in August 2016 which was normal -I encouraged her to continue taking calcium (she is on 3 times a week) and vitamin D, and regular exercise.   4.  she'll continue follow-up with her primary care physician for other medical problems.   Follow-up: Return to clinic in 6 months with lab. We'll repeat CBC and CMP on next visit.  Truitt Merle  04/18/2016     .

## 2016-04-18 NOTE — Telephone Encounter (Signed)
PATIENT CALLED TO SCHEDULE FUTURE APPOINTMENTS. 10/16/2016 APPOINTMENTS SCHEDULED PER 09/28 LOS.

## 2016-10-13 ENCOUNTER — Other Ambulatory Visit: Payer: Self-pay | Admitting: Hematology

## 2016-10-13 DIAGNOSIS — C50411 Malignant neoplasm of upper-outer quadrant of right female breast: Secondary | ICD-10-CM

## 2016-10-13 DIAGNOSIS — Z170 Estrogen receptor positive status [ER+]: Principal | ICD-10-CM

## 2016-10-14 NOTE — Progress Notes (Signed)
[facility] NOTE  CC  [name], MD [address] [place] [address] Dr. Thea Silversmith  Dr. Jackolyn Confer  DIAGNOSIS: 76 year old female with new diagnosis of stage I right side invasive ductal carcinoma of the breast. Patient is seen in the multidisciplinary breast clinic  STAGE:  Cancer of upper-outer quadrant of female breast  Primary site: Breast (Right)  Staging method: AJCC 7th Edition  Clinical: (T1b, NX, cM0)  Summary: (T1b, NX, cM0)  PRIOR THERAPY: #1Patient was seen for a six-month recall mammogram for a suspicious right breast nodule. This was at the 9:00 position. Because of this she underwent a repeat diagnostic mammogram performed on 01/05/2013. There was a small nodule that persisted appearing to be most likely irregular and margination. She had an ultrasound that demonstrated a small 3-4 mm nodular density. This appeared suspicious because of this patient underwent a stereotactic biopsy of the 9:00 nodule. The pathology showed an invasive ductal carcinoma with ductal carcinoma in situ. The tumor was grade 2 with associated high-grade DCIS. There was ER positive PR positive HER-2/neu negative with a Ki-67 of 10%. She had MRI of the breasts performed the MRI did not show any abnormal enhancement in the left breast. However in the right breast there was an 8 x 8 x 6 mm enhancing mass in the 9:00 region. There were post biopsy change is noted and biopsy clip was foun  #2 patient has undergone a lumpectomy on 02/01/2013. This revealed a 0.4 cm area of invasive cancer that was grade 1 with associated low-grade DCIS and atypical lobular hyperplasia one sentinel node was negative for metastatic disease. Tumor was ER +100% PR +80%. HER-2/neu negative with a Ki-67 of 50%.   #3 Aromasin 77m daily starting 02/24/2013, changed to tamoxifen in July 2017 due to muscular achiness.   CURRENT THERAPY: Tamoxifen 20 mg once daily,  started in July 2017.  INTERVAL HISTORY: PCAISLEY BAXENDALE89y.o. female returns for followup visit today of her h/o invasive ductal carcinoma of the breast.  She is doing well overall other than she has not been sleeping well. She is not sure what is causing this. States she wakes up about every 15 minutes. She denies night sweats. She denies any pain, joint pain, or any issues with tamoxifen. She had a screening mammogram earlier this year and requested they send the results to our clinic. She was told to return for screening mammogram again in one year. [facility]   MEDICAL HISTORY: Past Medical History:  Diagnosis Date  . Allergy    Pcn, Sulfa, Codeine  . Anxiety   . Arthritis   . Cataract    surgey b/l  . [place] disease) [place]   . Cough   . Depression    hx years ago  . Hot flashes   . Shortness of breath   . Sjogren's disease ([facility]     ALLERGIES:  is allergic to demerol [meperidine]; penicillins; codeine; and sulfa antibiotics.  MEDICATIONS:  Current Outpatient Prescriptions  Medication Sig Dispense Refill  . azelastine (ASTELIN) 137 MCG/SPRAY nasal spray Place 1 spray into the nose 2 (two) times daily. Use in each nostril as directed    . beta carotene w/minerals (OCUVITE) tablet Take 1 tablet by mouth daily.    . cholecalciferol (VITAMIN D) 1000 UNITS tablet Take 1,000 Units by mouth daily.    . Fluticasone-Salmeterol (ADVAIR) 100-50 MCG/DOSE  AEPB Inhale 2 puffs into the lungs every morning.    . montelukast (SINGULAIR) 10 MG tablet Take 10 mg by mouth at bedtime.     . Multiple Vitamins-Minerals (CENTRUM PO) Take by mouth daily.    Marland Kitchen OVER THE COUNTER MEDICATION 1 tablet daily. Osteo biflex    . tamoxifen (NOLVADEX) 20 MG tablet take 1 tablet by mouth once daily 60 tablet 1  . vitamin C (ASCORBIC ACID) 250 MG tablet Take 250 mg by mouth daily.     No current facility-administered medications for this  visit.     SURGICAL HISTORY:  Past Surgical History:  Procedure Laterality Date  . ABDOMINAL HYSTERECTOMY  74   partial  . BREAST LUMPECTOMY WITH NEEDLE LOCALIZATION AND AXILLARY SENTINEL LYMPH NODE BX Right 02/01/2013   Procedure: BREAST LUMPECTOMY WITH NEEDLE LOCALIZATION AND AXILLARY SENTINEL LYMPH NODE BX;  Surgeon: Odis Hollingshead, MD;  Location: [facility];  Service: General;  Laterality: Right;  . BREAST SURGERY Right 1965   biopsy  . CERVICAL FUSION  2010  . CHOLECYSTECTOMY  2002  . COLONOSCOPY    . DILATION AND CURETTAGE OF UTERUS      REVIEW OF SYSTEMS:  A 10 point review of systems was conducted and is otherwise negative except for what is noted above.    Health Maintenance Mammogram: 08/2016 Colonoscopy:unsure, thinks it was 1-2 years ago Bone Density Scan: 02/2015 Pap Smear: does not have anymore, has had TAH Eye Exam: 2015 Vitamin D Level: followed by Dr. Kenton Kingfisher, PCP Lipid Panel:  Followed by Dr. Kenton Kingfisher PCP  PHYSICAL EXAMINATION: BP (!) 141/78 (BP Location: Left Arm, Patient Position: Sitting)   Pulse 91   Temp 99.1 F (37.3 C) (Oral)   Resp 18   Ht _0  (1.549 m)   Wt 161 lb 4.8 oz (73.2 kg)   SpO2 97%   BMI 30.48 kg/m   Body mass index is 30.48 kg/m. GENERAL: Patient is a well appearing female in no acute distress HEENT:  Sclerae anicteric.  Oropharynx clear and moist. No ulcerations or evidence of oropharyngeal candidiasis. Neck is supple.  NODES:  No cervical, supraclavicular, or axillary lymphadenopathy palpated.  BREAST EXAM:   Lumpectomy without nodularity or abnormality. No suspicious findings on bilateral breast exam. LUNGS:  Clear to auscultation bilaterally.  No wheezes or rhonchi. HEART:  Regular rate and rhythm. No murmur appreciated. ABDOMEN:  Soft, nontender.  Positive, normoactive bowel sounds. No organomegaly palpated. MSK:  No focal spinal tenderness to palpation. Full range of motion bilaterally in the upper  extremities. EXTREMITIES:  No peripheral edema.   SKIN:  Clear with no obvious rashes or skin changes. No nail dyscrasia. NEURO:  Nonfocal. Well oriented.  Appropriate affect. ECOG PERFORMANCE STATUS: 1    LABORATORY DATA: CBC Latest Ref Rng & Units 10/16/2016 04/18/2016 10/17/2015  WBC 3.9 - 10.3 10e3/uL 10.0 10.1 8.8  Hemoglobin 11.6 - 15.9 g/dL 13.6 14.2 14.3  Hematocrit 34.8 - 46.6 % 41.1 43.0 43.2  Platelets 145 - 400 10e3/uL 183 173 181    CMP Latest Ref Rng & Units 10/16/2016 04/18/2016 10/17/2015  Glucose 70 - 140 mg/dl 94 94 95  BUN 7.0 - 26.0 mg/dL 13.1 10.0 13.5  Creatinine 0.6 - 1.1 mg/dL 0.8 0.8 0.8  Sodium 136 - 145 mEq/L 139 138 138  Potassium 3.5 - 5.1 mEq/L 3.9 4.2 4.3  Chloride 98 - 107 mEq/L - - -  CO2 22 - 29 mEq/L _1 Calcium  8.4 - 10.4 mg/dL 8.9 9.0 9.2  Total Protein 6.4 - 8.3 g/dL 7.1 7.2 7.1  Total Bilirubin 0.20 - 1.20 mg/dL 0.50 0.58 0.71  Alkaline Phos 40 - 150 U/L 76 78 87  AST 5 - 34 U/L _0 ALT 0 - 55 U/L _1 RADIOGRAPHIC STUDIES:  Diagnostic mammogram 09/10/2016 Previously seen asymmetry in the right central breast posterior depth on the CC view only is no longer present on today's exam. No significant masses, calcifications, or other findings are seen in the breast.  Bone density scan 03/06/2015 Normal  Bilateral 3-D mammogram diagnostic 03/06/2015 There is no mammographic evidence of malignancy. Routine mammographic evaluation in 1 year is recommended.  ASSESSMENT: 76 y.o.  female with  1.  Breast cancer of upper outer quadrant of right breast, invasive ductal carcinoma, pT1aN0M0, stage IA, ER positive PR positive HER-2/neu negative. -She is clinically doing very well, physical exam was unremarkable. No evidence of recurrence -lab results were reviewed with her, CBC and CMP all within normal limits.  -She had her diagnostic mammogram in February 2018 at [facility]. Previously seen asymmetry in the right central breast  posterior depth on the CC view only is no longer present and NED. She is due to routine b/l mammogram in 6 month -She had Aromasin for 3 years, due to the diffuse muscular achiness, he was switched to tamoxifen. She is tolerating tamoxifen very well, I recommend her to have it for 5 years. Potential side effects from tamoxifen, especially risk of thrombosis and endometrial cancer were discussed with her. -We'll continue breast cancer surveillance. She will have a close follow-up mammogram in 5 months. I encouraged her to do self exam, and I'll see her back in 6 months. -I encouraged her to continue healthy diet, remain to be physically active, and continue exercise.  2. Bone health -She had bone density scan in August 2016 which was normal -I encouraged her to continue taking calcium (she is on 3 times a week) and vitamin D, and regular exercise.   3.  she'll continue follow-up with her primary care physician for other medical problems.   Follow-up:  -Return to clinic in 6 months with lab. We'll repeat CBC and CMP on next visit.  -I have ordered repeat mammogram to be done in 02/2017.   This document serves as a record of services personally performed by Truitt Merle, MD. It was created on her behalf by Arlyce Harman, a trained medical scribe. The creation of this record is based on the scribe's personal observations and the provider's statements to them. This document has been checked and approved by the attending provider.   Truitt Merle  10/16/2016

## 2016-10-16 ENCOUNTER — Other Ambulatory Visit (HOSPITAL_BASED_OUTPATIENT_CLINIC_OR_DEPARTMENT_OTHER): Payer: Medicare Other

## 2016-10-16 ENCOUNTER — Ambulatory Visit (HOSPITAL_BASED_OUTPATIENT_CLINIC_OR_DEPARTMENT_OTHER): Payer: Medicare Other | Admitting: Hematology

## 2016-10-16 ENCOUNTER — Encounter: Payer: Self-pay | Admitting: Hematology

## 2016-10-16 VITALS — BP 141/78 | HR 91 | Temp 99.1°F | Resp 18 | Ht 61.0 in | Wt 161.3 lb

## 2016-10-16 DIAGNOSIS — C50411 Malignant neoplasm of upper-outer quadrant of right female breast: Secondary | ICD-10-CM

## 2016-10-16 DIAGNOSIS — Z170 Estrogen receptor positive status [ER+]: Secondary | ICD-10-CM | POA: Diagnosis not present

## 2016-10-16 LAB — COMPREHENSIVE METABOLIC PANEL
ALT: 18 U/L (ref 0–55)
AST: 19 U/L (ref 5–34)
Albumin: 3.6 g/dL (ref 3.5–5.0)
Alkaline Phosphatase: 76 U/L (ref 40–150)
Anion Gap: 9 mEq/L (ref 3–11)
BUN: 13.1 mg/dL (ref 7.0–26.0)
CO2: 25 mEq/L (ref 22–29)
Calcium: 8.9 mg/dL (ref 8.4–10.4)
Chloride: 105 mEq/L (ref 98–109)
Creatinine: 0.8 mg/dL (ref 0.6–1.1)
EGFR: 76 mL/min/{1.73_m2} — ABNORMAL LOW (ref >90)
Glucose: 94 mg/dl (ref 70–140)
Potassium: 3.9 mEq/L (ref 3.5–5.1)
Sodium: 139 mEq/L (ref 136–145)
Total Bilirubin: 0.5 mg/dL (ref 0.20–1.20)
Total Protein: 7.1 g/dL (ref 6.4–8.3)

## 2016-10-16 LAB — CBC WITH DIFFERENTIAL/PLATELET
BASO%: 0.5 % (ref 0.0–2.0)
Basophils Absolute: 0.1 10*3/uL (ref 0.0–0.1)
EOS%: 1.7 % (ref 0.0–7.0)
Eosinophils Absolute: 0.2 10*3/uL (ref 0.0–0.5)
HCT: 41.1 % (ref 34.8–46.6)
HGB: 13.6 g/dL (ref 11.6–15.9)
LYMPH%: 24 % (ref 14.0–49.7)
MCH: 30.8 pg (ref 25.1–34.0)
MCHC: 33.1 g/dL (ref 31.5–36.0)
MCV: 93.2 fL (ref 79.5–101.0)
MONO#: 0.7 10*3/uL (ref 0.1–0.9)
MONO%: 6.6 % (ref 0.0–14.0)
NEUT#: 6.7 10*3/uL — ABNORMAL HIGH (ref 1.5–6.5)
NEUT%: 67.2 % (ref 38.4–76.8)
Platelets: 183 10*3/uL (ref 145–400)
RBC: 4.41 10*6/uL (ref 3.70–5.45)
RDW: 13 % (ref 11.2–14.5)
WBC: 10 10*3/uL (ref 3.9–10.3)
lymph#: 2.4 10*3/uL (ref 0.9–3.3)

## 2016-10-18 ENCOUNTER — Telehealth: Payer: Self-pay | Admitting: Hematology

## 2016-10-18 NOTE — Telephone Encounter (Signed)
lvm to inform pt of 6 month appt 10/1 at 1115 per LOS

## 2017-02-05 ENCOUNTER — Other Ambulatory Visit: Payer: Self-pay | Admitting: Hematology

## 2017-02-05 DIAGNOSIS — Z170 Estrogen receptor positive status [ER+]: Principal | ICD-10-CM

## 2017-02-05 DIAGNOSIS — C50411 Malignant neoplasm of upper-outer quadrant of right female breast: Secondary | ICD-10-CM

## 2017-04-20 NOTE — Progress Notes (Signed)
[facility] OFFICE PROGRESS NOTE  CC  Michelle Frees, MD [address] [place] [address] Dr. Thea Silversmith  Dr. Jackolyn Confer  DIAGNOSIS: 76 year old female with new diagnosis of stage I right side invasive ductal carcinoma of the breast. Patient is seen in the multidisciplinary breast clinic  STAGE:  Cancer of upper-outer quadrant of female breast  Primary site: Breast (Right)  Staging method: AJCC 7th Edition  Clinical: (T1b, NX, cM0)  Summary: (T1b, NX, cM0)  PRIOR THERAPY: #1Patient was seen for a six-month recall mammogram for a suspicious right breast nodule. This was at the 9:00 position. Because of this she underwent a repeat diagnostic mammogram performed on 01/05/2013. There was a small nodule that persisted appearing to be most likely irregular and margination. She had an ultrasound that demonstrated a small 3-4 mm nodular density. This appeared suspicious because of this patient underwent a stereotactic biopsy of the 9:00 nodule. The pathology showed an invasive ductal carcinoma with ductal carcinoma in situ. The tumor was grade 2 with associated high-grade DCIS. There was ER positive PR positive HER-2/neu negative with a Ki-67 of 10%. She had MRI of the breasts performed the MRI did not show any abnormal enhancement in the left breast. However in the right breast there was an 8 x 8 x 6 mm enhancing mass in the 9:00 region. There were post biopsy change is noted and biopsy clip was foun  #2 patient has undergone a lumpectomy on 02/01/2013. This revealed a 0.4 cm area of invasive cancer that was grade 1 with associated low-grade DCIS and atypical lobular hyperplasia one sentinel node was negative for metastatic disease. Tumor was ER +100% PR +80%. HER-2/neu negative with a Ki-67 of 50%.   #3 Aromasin 56m daily starting 02/24/2013, changed to tamoxifen in July 2017 due to muscular achiness.   CURRENT THERAPY: Tamoxifen 20 mg once daily,  started in July 2017.  INTERVAL HISTORY: Michelle [id number].o. female returns for followup visit today of her h/o invasive ductal carcinoma of the breast. She is doing well overall other than she has not been sleeping well. She reports that this has been ongoing for 3-4 months and she has only been sleeping typically 3-4 hours a night. She denies any recent increased life stressors. She has been taking some benadryl to try and help with this but it hs not been helping. She has not been able to take naps in the afternoon to combat the lack of sleep. She does not have any issues with falling asleep. She denies night sweats, or any other associated issues. She continues taking Tamoxifen and she denies any complaints with this. Her arthralgias she previously experienced have completely resolved. Pt notes that she last had a bone density scan and a mammogram two months ago [facility]    MEDICAL HISTORY: Past Medical History:  Diagnosis Date  . Allergy    Pcn, Sulfa, Codeine  . Anxiety   . Arthritis   . Cataract    surgey b/l  . COPD (chronic obstructive pulmonary disease) ([facility]   . Cough   . Depression    hx years ago  . Hot flashes   . Shortness of breath   . Sjogren's disease ([facility]     ALLERGIES:  is allergic to demerol [meperidine]; penicillins; codeine; and sulfa antibiotics.  MEDICATIONS:  Current Outpatient Prescriptions  Medication Sig Dispense Refill  . azelastine (ASTELIN) 137 MCG/SPRAY nasal spray Place 1 spray into the nose  2 (two) times daily. Use in each nostril as directed    . beta carotene w/minerals (OCUVITE) tablet Take 1 tablet by mouth daily.    . cholecalciferol (VITAMIN D) 1000 UNITS tablet Take 1,000 Units by mouth daily.    . Fluticasone-Salmeterol (ADVAIR) 100-50 MCG/DOSE AEPB Inhale 2 puffs into the lungs every morning.    . montelukast (SINGULAIR) 10 MG tablet Take 10 mg by mouth at bedtime.     . Multiple Vitamins-Minerals (CENTRUM PO) Take by mouth daily.     Marland Kitchen OVER THE COUNTER MEDICATION 1 tablet daily. Osteo biflex    . tamoxifen (NOLVADEX) 20 MG tablet take 1 tablet by mouth once daily 60 tablet 1  . vitamin C (ASCORBIC ACID) 250 MG tablet Take 250 mg by mouth daily.     No current facility-administered medications for this visit.     SURGICAL HISTORY:  Past Surgical History:  Procedure Laterality Date  . ABDOMINAL HYSTERECTOMY  74   partial  . BREAST LUMPECTOMY WITH NEEDLE LOCALIZATION AND AXILLARY SENTINEL LYMPH NODE BX Right 02/01/2013   Procedure: BREAST LUMPECTOMY WITH NEEDLE LOCALIZATION AND AXILLARY SENTINEL LYMPH NODE BX;  Surgeon: Odis Hollingshead, MD;  Location: [facility];  Service: General;  Laterality: Right;  . BREAST SURGERY Right 1965   biopsy  . CERVICAL FUSION  2010  . CHOLECYSTECTOMY  2002  . COLONOSCOPY    . DILATION AND CURETTAGE OF UTERUS      REVIEW OF SYSTEMS:  A 10+ POINT REVIEW OF SYSTEMS WAS OBTAINED including neurology, dermatology, psychiatry, cardiac, respiratory, lymph, extremities, GI, GU, Musculoskeletal, constitutional, breasts, reproductive, HEENT.  All pertinent positives are noted in the HPI.  All others are negative.  Health Maintenance Mammogram: 02/2017 Colonoscopy:unsure, thinks it was 1-2 years ago Bone Density Scan: 02/2015 Pap Smear: does not have anymore, has had [facility] Eye Exam: 2015 Vitamin D Level: followed by Dr. Kenton Kingfisher, PCP Lipid Panel:  Followed by Dr. Kenton Kingfisher PCP  PHYSICAL EXAMINATION:  BP (!) 146/79 (BP Location: Left Arm, Patient Position: Sitting)   Pulse 81   Temp 98.6 F (37 C) (Oral)   Resp 20   Ht _0  (1.549 m)   Wt 158 lb 12.8 oz (72 kg)   SpO2 99%   BMI 30.00 kg/m   Body mass index is 30 kg/m. GENERAL: Patient is a well appearing female in no acute distress HEENT:  Sclerae anicteric.  Oropharynx clear and moist. No ulcerations or evidence of oropharyngeal candidiasis. Neck is supple.  NODES:  No cervical, supraclavicular, or axillary  lymphadenopathy palpated.  BREAST EXAM:   Lumpectomy without nodularity or abnormality. No suspicious findings on bilateral breast exam. LUNGS:  Clear to auscultation bilaterally.  No wheezes or rhonchi. HEART:  Regular rate and rhythm. No murmur appreciated. ABDOMEN:  Soft, nontender.  Positive, normoactive bowel sounds. No organomegaly palpated. MSK:  No focal spinal tenderness to palpation. Full range of motion bilaterally in the upper extremities. EXTREMITIES:  No peripheral edema.   SKIN:  Clear with no obvious rashes or skin changes. No nail dyscrasia. NEURO:  Nonfocal. Well oriented.  Appropriate affect. ECOG PERFORMANCE STATUS: 1   LABORATORY DATA: CBC Latest Ref Rng & Units 04/21/2017 10/16/2016 04/18/2016  WBC 3.9 - 10.3 10e3/uL 9.8 10.0 10.1  Hemoglobin 11.6 - 15.9 g/dL 14.3 13.6 14.2  Hematocrit 34.8 - 46.6 % 42.4 41.1 43.0  Platelets 145 - 400 10e3/uL 164 183 173    CMP Latest Ref Rng & Units 04/21/2017 10/16/2016  04/18/2016  Glucose 70 - 140 mg/dl 95 94 94  BUN 7.0 - 26.0 mg/dL 12.4 13.1 10.0  Creatinine 0.6 - 1.1 mg/dL 0.8 0.8 0.8  Sodium 136 - 145 mEq/L 139 139 138  Potassium 3.5 - 5.1 mEq/L 4.3 3.9 4.2  Chloride 98 - 107 mEq/L - - -  CO2 22 - 29 mEq/L _0 Calcium 8.4 - 10.4 mg/dL 9.2 8.9 9.0  Total Protein 6.4 - 8.3 g/dL 7.2 7.1 7.2  Total Bilirubin 0.20 - 1.20 mg/dL 0.41 0.50 0.58  Alkaline Phos 40 - 150 U/L 71 76 78  AST 5 - 34 U/L _1 ALT 0 - 55 U/L _2 RADIOGRAPHIC STUDIES:  Bone density scan 03/06/2015 Normal  Bilateral 3-D mammogram diagnostic 03/06/2015 There is no mammographic evidence of malignancy. Routine mammographic evaluation in 1 year is recommended.  ASSESSMENT: 76 y.o.  female with  1.  Breast cancer of upper outer quadrant of right breast, invasive ductal carcinoma, pT1aN0M0, stage IA, ER positive PR positive HER-2/neu negative. -She is clinically doing very well, physical exam was unremarkable. No evidence of  recurrence -lab results were reviewed with her, CBC and CMP all within normal limits.  -She had her diagnostic mammogram in February 2018 at [facility]. Previously seen asymmetry in the right central breast posterior depth on the CC view only is no longer present and NED. She is due to routine b/l mammogram in 6 month -She had Aromasin for 3 years, due to the diffuse muscular achiness, he was switched to tamoxifen. She is tolerating tamoxifen very well, I recommend her to have it for 5 years. She still has 4 years remaining as of 04/21/17. Potential side effects from tamoxifen, especially risk of thrombosis and endometrial cancer were discussed with her. -We'll continue breast cancer surveillance. She will have a close follow-up mammogram in 5 months. I encouraged her to do self exam, and I'll see her back in 6 months. We will space it out to annual visits following this.  -We will obtain her most recent bone density scans and mammogram from [facility].  -I encouraged her to continue healthy diet, remain to be physically active, and continue exercise.  2. Bone health -She had bone density scan in August 2016 which was normal -I encouraged her to continue taking calcium (she is on 3 times a week) and vitamin D, and regular exercise. -I will request her recent bone scans from [facility].   3. Insomnia   -She has had ongoing issues with waking up in the middle of the night and being unable to fall back asleep. I urged her to try taking OTC Melatonin to help with this. I also informed her to decrease her caffeine intake in the evenings, decrease her life stressors, and to rest in the hours leading up to going to bed.  -She has f/u w/ her PCP at the end of the month and she states that she will discuss this with him.   4. Chronic medical issues she'll continue follow-up with her primary care physician for other medical problems.   PLAN:  -continue Tamoxifen  -Lab and f/u in 57mo will change to f/u every year after  next visit   This document serves as a record of services personally performed by YTruitt Merle MD. It was created on her behalf by WReola Mosher a trained medical scribe. The creation of this record is based on the scribe's personal observations and the provider's statements  to them. This document has been checked and approved by the attending provider.  Truitt Merle  04/21/2017 2:51 PM   Addendum  DEXA from 03/12/2017 was normal   Truitt Merle

## 2017-04-21 ENCOUNTER — Telehealth: Payer: Self-pay | Admitting: Hematology

## 2017-04-21 ENCOUNTER — Other Ambulatory Visit (HOSPITAL_BASED_OUTPATIENT_CLINIC_OR_DEPARTMENT_OTHER): Payer: Medicare Other

## 2017-04-21 ENCOUNTER — Encounter: Payer: Self-pay | Admitting: Hematology

## 2017-04-21 ENCOUNTER — Ambulatory Visit (HOSPITAL_BASED_OUTPATIENT_CLINIC_OR_DEPARTMENT_OTHER): Payer: Medicare Other | Admitting: Hematology

## 2017-04-21 VITALS — BP 146/79 | HR 81 | Temp 98.6°F | Resp 20 | Ht 61.0 in | Wt 158.8 lb

## 2017-04-21 DIAGNOSIS — C50411 Malignant neoplasm of upper-outer quadrant of right female breast: Secondary | ICD-10-CM

## 2017-04-21 DIAGNOSIS — Z170 Estrogen receptor positive status [ER+]: Secondary | ICD-10-CM | POA: Diagnosis not present

## 2017-04-21 DIAGNOSIS — Z79810 Long term (current) use of selective estrogen receptor modulators (SERMs): Secondary | ICD-10-CM

## 2017-04-21 LAB — COMPREHENSIVE METABOLIC PANEL
ALT: 13 U/L (ref 0–55)
AST: 18 U/L (ref 5–34)
Albumin: 3.7 g/dL (ref 3.5–5.0)
Alkaline Phosphatase: 71 U/L (ref 40–150)
Anion Gap: 9 mEq/L (ref 3–11)
BUN: 12.4 mg/dL (ref 7.0–26.0)
CO2: 26 mEq/L (ref 22–29)
Calcium: 9.2 mg/dL (ref 8.4–10.4)
Chloride: 105 mEq/L (ref 98–109)
Creatinine: 0.8 mg/dL (ref 0.6–1.1)
EGFR: 69 mL/min/{1.73_m2} — ABNORMAL LOW (ref >90)
Glucose: 95 mg/dl (ref 70–140)
Potassium: 4.3 mEq/L (ref 3.5–5.1)
Sodium: 139 mEq/L (ref 136–145)
Total Bilirubin: 0.41 mg/dL (ref 0.20–1.20)
Total Protein: 7.2 g/dL (ref 6.4–8.3)

## 2017-04-21 LAB — CBC WITH DIFFERENTIAL/PLATELET
BASO%: 1 % (ref 0.0–2.0)
Basophils Absolute: 0.1 10*3/uL (ref 0.0–0.1)
EOS%: 1.5 % (ref 0.0–7.0)
Eosinophils Absolute: 0.2 10*3/uL (ref 0.0–0.5)
HCT: 42.4 % (ref 34.8–46.6)
HGB: 14.3 g/dL (ref 11.6–15.9)
LYMPH%: 24.8 % (ref 14.0–49.7)
MCH: 31 pg (ref 25.1–34.0)
MCHC: 33.7 g/dL (ref 31.5–36.0)
MCV: 92.1 fL (ref 79.5–101.0)
MONO#: 0.6 10*3/uL (ref 0.1–0.9)
MONO%: 5.8 % (ref 0.0–14.0)
NEUT#: 6.6 10*3/uL — ABNORMAL HIGH (ref 1.5–6.5)
NEUT%: 66.9 % (ref 38.4–76.8)
Platelets: 164 10*3/uL (ref 145–400)
RBC: 4.61 10*6/uL (ref 3.70–5.45)
RDW: 12.6 % (ref 11.2–14.5)
WBC: 9.8 10*3/uL (ref 3.9–10.3)
lymph#: 2.4 10*3/uL (ref 0.9–3.3)

## 2017-04-21 NOTE — Telephone Encounter (Signed)
Scheduled appt per 10/1 los - Gave patient AVS and calender per los. - lab and f/u in 6 months.

## 2017-04-24 ENCOUNTER — Encounter: Payer: Self-pay | Admitting: Hematology

## 2017-04-28 ENCOUNTER — Encounter: Payer: Self-pay | Admitting: Hematology

## 2017-06-10 ENCOUNTER — Other Ambulatory Visit: Payer: Self-pay | Admitting: *Deleted

## 2017-06-10 DIAGNOSIS — C50411 Malignant neoplasm of upper-outer quadrant of right female breast: Secondary | ICD-10-CM

## 2017-06-10 DIAGNOSIS — Z170 Estrogen receptor positive status [ER+]: Principal | ICD-10-CM

## 2017-06-10 MED ORDER — TAMOXIFEN CITRATE 20 MG PO TABS: 20.0000 mg | ORAL_TABLET | Freq: Every day | ORAL | 3 refills | Status: DC

## 2017-09-21 ENCOUNTER — Inpatient Hospital Stay (HOSPITAL_COMMUNITY)
Admission: EM | Admit: 2017-09-21 | Discharge: 2017-09-25 | DRG: 287 | Disposition: A | Discharge status: Home / Self Care | Payer: Medicare Other | Attending: Internal Medicine | Admitting: Internal Medicine

## 2017-09-21 ENCOUNTER — Other Ambulatory Visit: Payer: Self-pay

## 2017-09-21 ENCOUNTER — Emergency Department (HOSPITAL_COMMUNITY): Payer: Medicare Other

## 2017-09-21 ENCOUNTER — Encounter (HOSPITAL_COMMUNITY): Payer: Self-pay | Admitting: Emergency Medicine

## 2017-09-21 DIAGNOSIS — C50411 Malignant neoplasm of upper-outer quadrant of right female breast: Secondary | ICD-10-CM | POA: Diagnosis present

## 2017-09-21 DIAGNOSIS — R0789 Other chest pain: Secondary | ICD-10-CM

## 2017-09-21 DIAGNOSIS — R9439 Abnormal result of other cardiovascular function study: Secondary | ICD-10-CM | POA: Diagnosis present

## 2017-09-21 DIAGNOSIS — I480 Paroxysmal atrial fibrillation: Secondary | ICD-10-CM | POA: Diagnosis not present

## 2017-09-21 DIAGNOSIS — R0782 Intercostal pain: Secondary | ICD-10-CM | POA: Diagnosis not present

## 2017-09-21 DIAGNOSIS — R079 Chest pain, unspecified: Secondary | ICD-10-CM | POA: Diagnosis not present

## 2017-09-21 DIAGNOSIS — D72829 Elevated white blood cell count, unspecified: Secondary | ICD-10-CM

## 2017-09-21 DIAGNOSIS — Z9049 Acquired absence of other specified parts of digestive tract: Secondary | ICD-10-CM

## 2017-09-21 DIAGNOSIS — F419 Anxiety disorder, unspecified: Secondary | ICD-10-CM | POA: Diagnosis present

## 2017-09-21 DIAGNOSIS — Z853 Personal history of malignant neoplasm of breast: Secondary | ICD-10-CM

## 2017-09-21 DIAGNOSIS — Z79810 Long term (current) use of selective estrogen receptor modulators (SERMs): Secondary | ICD-10-CM

## 2017-09-21 DIAGNOSIS — Z981 Arthrodesis status: Secondary | ICD-10-CM

## 2017-09-21 DIAGNOSIS — F32A Depression, unspecified: Secondary | ICD-10-CM

## 2017-09-21 DIAGNOSIS — F17213 Nicotine dependence, cigarettes, with withdrawal: Secondary | ICD-10-CM | POA: Diagnosis present

## 2017-09-21 DIAGNOSIS — I2510 Atherosclerotic heart disease of native coronary artery without angina pectoris: Secondary | ICD-10-CM | POA: Diagnosis present

## 2017-09-21 DIAGNOSIS — Z720 Tobacco use: Secondary | ICD-10-CM

## 2017-09-21 DIAGNOSIS — Z803 Family history of malignant neoplasm of breast: Secondary | ICD-10-CM

## 2017-09-21 DIAGNOSIS — Z885 Allergy status to narcotic agent status: Secondary | ICD-10-CM

## 2017-09-21 DIAGNOSIS — F329 Major depressive disorder, single episode, unspecified: Secondary | ICD-10-CM | POA: Diagnosis present

## 2017-09-21 DIAGNOSIS — M1990 Unspecified osteoarthritis, unspecified site: Secondary | ICD-10-CM | POA: Diagnosis present

## 2017-09-21 DIAGNOSIS — Z880 Allergy status to penicillin: Secondary | ICD-10-CM

## 2017-09-21 DIAGNOSIS — Z7951 Long term (current) use of inhaled steroids: Secondary | ICD-10-CM

## 2017-09-21 DIAGNOSIS — J449 Chronic obstructive pulmonary disease, unspecified: Secondary | ICD-10-CM | POA: Diagnosis present

## 2017-09-21 DIAGNOSIS — I340 Nonrheumatic mitral (valve) insufficiency: Secondary | ICD-10-CM | POA: Diagnosis present

## 2017-09-21 DIAGNOSIS — Z882 Allergy status to sulfonamides status: Secondary | ICD-10-CM

## 2017-09-21 DIAGNOSIS — R739 Hyperglycemia, unspecified: Secondary | ICD-10-CM | POA: Diagnosis present

## 2017-09-21 DIAGNOSIS — Z90710 Acquired absence of both cervix and uterus: Secondary | ICD-10-CM

## 2017-09-21 DIAGNOSIS — T7840XA Allergy, unspecified, initial encounter: Secondary | ICD-10-CM | POA: Diagnosis present

## 2017-09-21 HISTORY — DX: Unspecified atrial fibrillation: I48.91

## 2017-09-21 LAB — URINALYSIS, ROUTINE W REFLEX MICROSCOPIC
Bilirubin Urine: NEGATIVE
Glucose, UA: NEGATIVE mg/dL
Hgb urine dipstick: NEGATIVE
Ketones, ur: NEGATIVE mg/dL
Leukocytes, UA: NEGATIVE
Nitrite: NEGATIVE
Protein, ur: NEGATIVE mg/dL
Specific Gravity, Urine: >1.046 — ABNORMAL HIGH (ref 1.005–1.030)
pH: 6 (ref 5.0–8.0)

## 2017-09-21 LAB — LIPID PANEL
Cholesterol: 171 mg/dL (ref 0–200)
HDL: 53 mg/dL (ref >40)
LDL Cholesterol: 95 mg/dL (ref 0–99)
Total CHOL/HDL Ratio: 3.2 RATIO
Triglycerides: 117 mg/dL (ref <150)
VLDL: 23 mg/dL (ref 0–40)

## 2017-09-21 LAB — BASIC METABOLIC PANEL
Anion gap: 10 (ref 5–15)
BUN: 9 mg/dL (ref 6–20)
CO2: 25 mmol/L (ref 22–32)
Calcium: 8.5 mg/dL — ABNORMAL LOW (ref 8.9–10.3)
Chloride: 102 mmol/L (ref 101–111)
Creatinine, Ser: 0.82 mg/dL (ref 0.44–1.00)
GFR calc Af Amer: >60 mL/min (ref >60)
GFR calc non Af Amer: >60 mL/min (ref >60)
Glucose, Bld: 124 mg/dL — ABNORMAL HIGH (ref 65–99)
Potassium: 4.2 mmol/L (ref 3.5–5.1)
Sodium: 137 mmol/L (ref 135–145)

## 2017-09-21 LAB — CBC
HCT: 42 % (ref 36.0–46.0)
Hemoglobin: 13.9 g/dL (ref 12.0–15.0)
MCH: 31.1 pg (ref 26.0–34.0)
MCHC: 33.1 g/dL (ref 30.0–36.0)
MCV: 94 fL (ref 78.0–100.0)
Platelets: 177 10*3/uL (ref 150–400)
RBC: 4.47 MIL/uL (ref 3.87–5.11)
RDW: 13.2 % (ref 11.5–15.5)
WBC: 20.8 10*3/uL — ABNORMAL HIGH (ref 4.0–10.5)

## 2017-09-21 LAB — DIFFERENTIAL
Basophils Absolute: 0 10*3/uL (ref 0.0–0.1)
Basophils Relative: 0 %
Eosinophils Absolute: 0.2 10*3/uL (ref 0.0–0.7)
Eosinophils Relative: 1 %
Lymphocytes Relative: 12 %
Lymphs Abs: 2.5 10*3/uL (ref 0.7–4.0)
Monocytes Absolute: 0.8 10*3/uL (ref 0.1–1.0)
Monocytes Relative: 4 %
Neutro Abs: 17.3 10*3/uL — ABNORMAL HIGH (ref 1.7–7.7)
Neutrophils Relative %: 83 %

## 2017-09-21 LAB — HEMOGLOBIN A1C
Hgb A1c MFr Bld: 5.7 % — ABNORMAL HIGH (ref 4.8–5.6)
Mean Plasma Glucose: 116.89 mg/dL

## 2017-09-21 LAB — I-STAT TROPONIN, ED
Troponin i, poc: 0.01 ng/mL (ref 0.00–0.08)
Troponin i, poc: 0 ng/mL (ref 0.00–0.08)

## 2017-09-21 LAB — TROPONIN I
Troponin I: <0.03 ng/mL (ref <0.03)
Troponin I: 0.04 ng/mL (ref <0.03)
Troponin I: <0.03 ng/mL (ref <0.03)

## 2017-09-21 LAB — SAVE SMEAR

## 2017-09-21 MED ORDER — VITAMIN D 1000 UNITS PO TABS
1000.0000 [IU] | ORAL_TABLET | Freq: Every day | ORAL | Status: DC
Start: 2017-09-21 — End: 2017-09-25
  Administered 2017-09-22 – 2017-09-25 (×4): 1000 [IU] via ORAL
  Filled 2017-09-21 (×4): qty 1

## 2017-09-21 MED ORDER — SODIUM CHLORIDE 0.9 % IV BOLUS (SEPSIS)
500.0000 mL | Freq: Once | INTRAVENOUS | Status: AC
  Administered 2017-09-21: 500 mL via INTRAVENOUS

## 2017-09-21 MED ORDER — HYDRALAZINE HCL 20 MG/ML IJ SOLN: 5.0000 mg | Freq: Three times a day (TID) | INTRAMUSCULAR | Status: DC | PRN

## 2017-09-21 MED ORDER — MONTELUKAST SODIUM 10 MG PO TABS
10.0000 mg | ORAL_TABLET | Freq: Every day | ORAL | Status: DC
  Administered 2017-09-21 – 2017-09-24 (×4): 10 mg via ORAL
  Filled 2017-09-21 (×4): qty 1

## 2017-09-21 MED ORDER — OXYCODONE-ACETAMINOPHEN 5-325 MG PO TABS
1.0000 | ORAL_TABLET | ORAL | Status: DC | PRN
  Administered 2017-09-21 (×2): 2 via ORAL
  Filled 2017-09-21 (×2): qty 2

## 2017-09-21 MED ORDER — ENOXAPARIN SODIUM 40 MG/0.4ML ~~LOC~~ SOLN
40.0000 mg | SUBCUTANEOUS | Status: DC
  Administered 2017-09-21: 40 mg via SUBCUTANEOUS
  Filled 2017-09-21: qty 0.4

## 2017-09-21 MED ORDER — NITROGLYCERIN 0.4 MG SL SUBL
0.4000 mg | SUBLINGUAL_TABLET | SUBLINGUAL | Status: DC | PRN
Start: 2017-09-21 — End: 2017-09-21
  Administered 2017-09-21 (×2): 0.4 mg via SUBLINGUAL
  Filled 2017-09-21: qty 1

## 2017-09-21 MED ORDER — AZELASTINE HCL 0.1 % NA SOLN
1.0000 | Freq: Two times a day (BID) | NASAL | Status: DC
  Administered 2017-09-22 – 2017-09-25 (×5): 1 via NASAL
  Filled 2017-09-21: qty 30

## 2017-09-21 MED ORDER — MORPHINE SULFATE (PF) 4 MG/ML IV SOLN: 1.0000 mg | INTRAVENOUS | Status: DC | PRN

## 2017-09-21 MED ORDER — SODIUM CHLORIDE 0.9 % IV SOLN
INTRAVENOUS | Status: DC
  Administered 2017-09-21: 10:00:00 via INTRAVENOUS

## 2017-09-21 MED ORDER — ACETAMINOPHEN 325 MG PO TABS: 650.0000 mg | ORAL_TABLET | ORAL | Status: DC | PRN

## 2017-09-21 MED ORDER — ONDANSETRON HCL 4 MG/2ML IJ SOLN: 4.0000 mg | Freq: Four times a day (QID) | INTRAMUSCULAR | Status: DC | PRN

## 2017-09-21 MED ORDER — NITROGLYCERIN 2 % TD OINT
1.0000 [in_us] | TOPICAL_OINTMENT | Freq: Once | TRANSDERMAL | Status: AC
  Administered 2017-09-21: 1 [in_us] via TOPICAL
  Filled 2017-09-21: qty 1

## 2017-09-21 MED ORDER — GI COCKTAIL ~~LOC~~: 30.0000 mL | Freq: Four times a day (QID) | ORAL | Status: DC | PRN

## 2017-09-21 MED ORDER — TAMOXIFEN CITRATE 10 MG PO TABS
20.0000 mg | ORAL_TABLET | Freq: Every day | ORAL | Status: DC
  Administered 2017-09-22 – 2017-09-25 (×4): 20 mg via ORAL
  Filled 2017-09-21 (×5): qty 2

## 2017-09-21 MED ORDER — ASPIRIN EC 325 MG PO TBEC
325.0000 mg | DELAYED_RELEASE_TABLET | Freq: Every day | ORAL | Status: DC
  Administered 2017-09-22 – 2017-09-23 (×2): 325 mg via ORAL
  Filled 2017-09-21 (×3): qty 1

## 2017-09-21 MED ORDER — ONDANSETRON HCL 4 MG/2ML IJ SOLN
4.0000 mg | Freq: Once | INTRAMUSCULAR | Status: AC
  Administered 2017-09-21: 4 mg via INTRAVENOUS
  Filled 2017-09-21: qty 2

## 2017-09-21 MED ORDER — MORPHINE SULFATE (PF) 4 MG/ML IV SOLN
4.0000 mg | Freq: Once | INTRAVENOUS | Status: AC
  Administered 2017-09-21: 4 mg via INTRAVENOUS
  Filled 2017-09-21: qty 1

## 2017-09-21 MED ORDER — IOPAMIDOL (ISOVUE-370) INJECTION 76%
INTRAVENOUS | Status: AC
  Administered 2017-09-21: 100 mL
  Filled 2017-09-21: qty 100

## 2017-09-21 MED ORDER — MOMETASONE FURO-FORMOTEROL FUM 100-5 MCG/ACT IN AERO
2.0000 | INHALATION_SPRAY | Freq: Two times a day (BID) | RESPIRATORY_TRACT | Status: DC
  Administered 2017-09-22 – 2017-09-25 (×5): 2 via RESPIRATORY_TRACT
  Filled 2017-09-21 (×2): qty 8.8

## 2017-09-21 MED ORDER — KETOROLAC TROMETHAMINE 15 MG/ML IJ SOLN
15.0000 mg | Freq: Four times a day (QID) | INTRAMUSCULAR | Status: DC | PRN
  Administered 2017-09-21: 15 mg via INTRAVENOUS
  Filled 2017-09-21: qty 1

## 2017-09-21 MED ORDER — ALPRAZOLAM 0.25 MG PO TABS: 0.2500 mg | ORAL_TABLET | Freq: Two times a day (BID) | ORAL | Status: DC | PRN

## 2017-09-21 MED ORDER — ONDANSETRON HCL 4 MG/2ML IJ SOLN
4.0000 mg | Freq: Four times a day (QID) | INTRAMUSCULAR | Status: DC | PRN
  Administered 2017-09-21 – 2017-09-22 (×2): 4 mg via INTRAVENOUS
  Filled 2017-09-21 (×2): qty 2

## 2017-09-21 MED ORDER — ALUM & MAG HYDROXIDE-SIMETH 200-200-20 MG/5ML PO SUSP
30.0000 mL | Freq: Once | ORAL | Status: AC
  Administered 2017-09-21: 30 mL via ORAL
  Filled 2017-09-21: qty 30

## 2017-09-21 MED ORDER — NICOTINE 7 MG/24HR TD PT24
7.0000 mg | MEDICATED_PATCH | Freq: Every day | TRANSDERMAL | Status: DC
  Administered 2017-09-21 – 2017-09-25 (×5): 7 mg via TRANSDERMAL
  Filled 2017-09-21 (×5): qty 1

## 2017-09-21 MED ORDER — SODIUM CHLORIDE 0.9 % IV SOLN
INTRAVENOUS | Status: DC
  Administered 2017-09-21 – 2017-09-22 (×3): via INTRAVENOUS

## 2017-09-21 NOTE — ED Provider Notes (Signed)
[facility] EMERGENCY DEPARTMENT Provider Note   CSN: [id number] Arrival date & time: 09/21/17  0451     History   Chief Complaint Chief Complaint  Patient presents with  . Chest Pain    HPI Michelle Gonzales is a 77 y.o. female.  HPI 77 year old female with a history of tobacco abuse presents the emergency department with approximately 2-1/2-3 hours of substernal chest pain described as a pressure that is tight and squeezing and radiates through to her back.  No radiation to her arms.  No radiation to her neck or jaw.  She had pain like this 1 other time and it turned out to be her gallbladder.  She has had a cholecystectomy.  No prior history of heart disease.  No prior history of heart catheterization or stress test.  She otherwise states she has been in her normal state of health over the past week.  She was up urinating in the middle the night when she developed the discomfort and pain.  Her pain is not changed.  She was given nitroglycerin by EMS without improvement.   Past Medical History:  Diagnosis Date  . Allergy    Pcn, Sulfa, Codeine  . Anxiety   . Arthritis   . Cataract    surgey b/l  . COPD (chronic obstructive pulmonary disease) ([place])   . Cough   . Depression    hx years ago  . Hot flashes   . Shortness of breath   . Sjogren's disease [facility])     Patient Active Problem List   Diagnosis Date Noted  . Allergy   . Breast cancer of upper-outer quadrant of right female breast ([facility]) 01/13/2013    Past Surgical History:  Procedure Laterality Date  . ABDOMINAL HYSTERECTOMY  74   partial  . BREAST LUMPECTOMY WITH NEEDLE LOCALIZATION AND AXILLARY SENTINEL LYMPH NODE BX Right 02/01/2013   Procedure: BREAST LUMPECTOMY WITH NEEDLE LOCALIZATION AND AXILLARY SENTINEL LYMPH NODE BX;  Surgeon: Odis Hollingshead, MD;  Location: [facility];  Service: General;  Laterality: Right;  . BREAST SURGERY Right 1965   biopsy  . CERVICAL FUSION  2010    . CHOLECYSTECTOMY  2002  . COLONOSCOPY    . DILATION AND CURETTAGE OF UTERUS      OB History    No data available       Home Medications    Prior to Admission medications   Medication Sig Start Date End Date Taking? Authorizing Provider  azelastine (ASTELIN) 137 MCG/SPRAY nasal spray Place 1 spray into the nose 2 (two) times daily. Use in each nostril as directed    [provider]  beta carotene w/minerals (OCUVITE) tablet Take 1 tablet by mouth daily.    [provider]  cholecalciferol (VITAMIN D) 1000 UNITS tablet Take 1,000 Units by mouth daily.    [provider]  Fluticasone-Salmeterol (ADVAIR) 100-50 MCG/DOSE AEPB Inhale 2 puffs into the lungs every morning.    [provider]  montelukast (SINGULAIR) 10 MG tablet Take 10 mg by mouth at bedtime.  02/01/13   [provider]  Multiple Vitamins-Minerals (CENTRUM PO) Take by mouth daily.    [provider]  OVER THE COUNTER MEDICATION 1 tablet daily. Osteo biflex    [provider]  tamoxifen (NOLVADEX) 20 MG tablet Take 1 tablet (20 mg total) daily by mouth. 06/10/17   Truitt Merle, MD  vitamin C (ASCORBIC ACID) 250 MG tablet Take 250 mg by  mouth daily.    [provider]    Family History Family History  Problem Relation Age of Onset  . Breast cancer Mother   . Cancer Mother        breast    Social History Social History   Tobacco Use  . Smoking status: Current Every Day Smoker    Packs/day: 0.50    Types: Cigarettes  . Smokeless tobacco: Never Used  Substance Use Topics  . Alcohol use: Yes    Comment: rare  . Drug use: No     Allergies   Demerol [meperidine]; Penicillins; Codeine; and Sulfa antibiotics   Review of Systems Review of Systems  All other systems reviewed and are negative.    Physical Exam Updated Vital Signs BP (!) 154/84   Pulse 86   Temp 98.3 F (36.8 C) (Oral)   Resp 14   Ht 5\' 1"  (1.549 m)   Wt 72.6 kg (160  lb)   SpO2 95%   BMI 30.23 kg/m   Physical Exam  Constitutional: She is oriented to person, place, and time. She appears well-developed and well-nourished. No distress.  HENT:  Head: Normocephalic and atraumatic.  Eyes: EOM are normal.  Neck: Normal range of motion.  Cardiovascular: Normal rate, regular rhythm and normal heart sounds.  Pulmonary/Chest: Effort normal and breath sounds normal.  Abdominal: Soft. She exhibits no distension. There is no tenderness.  Musculoskeletal: Normal range of motion.  Normal bilateral radial pulses.  Neurological: She is alert and oriented to person, place, and time.  Skin: Skin is warm and dry.  Psychiatric: She has a normal mood and affect. Judgment normal.  Nursing note and vitals reviewed.    ED Treatments / Results  Labs (all labs ordered are listed, but only abnormal results are displayed) Labs Reviewed  BASIC METABOLIC PANEL - Abnormal; Notable for the following components:      Result Value   Glucose, Bld 124 (*)    Calcium 8.5 (*)    All other components within normal limits  CBC - Abnormal; Notable for the following components:   WBC 20.8 (*)    All other components within normal limits  I-STAT TROPONIN, ED    EKG  EKG Interpretation  Date/Time:  Sunday September 21 2017 04:51:30 EST Ventricular Rate:  98 PR Interval:    QRS Duration: 76 QT Interval:  352 QTC Calculation: 450 R Axis:   62 Text Interpretation:  Sinus rhythm Probable anterior infarct, old No significant change was found Confirmed by Jola Schmidt [phone]) on 09/21/2017 5:32:12 AM Also confirmed by Jola Schmidt [phone])  on 09/21/2017 5:56:27 AM       Radiology Dg Chest 2 View  Result Date: 09/21/2017 CLINICAL DATA:  77 y/o F; centralized chest pain and shortness of breath. EXAM: CHEST  2 VIEW COMPARISON:  01/27/2013 chest radiograph FINDINGS: Stable normal cardiac silhouette given projection and technique. Reticular opacities of the lungs. No focal  consolidation, effusion, or pneumothorax. Stable chronic T5 compression deformity. No acute osseous abnormality identified. Anterior cervical fusion hardware noted. IMPRESSION: Reticular opacities of the lungs, probably bronchitic changes. No consolidation. Stable cardiac silhouette. Electronically Signed   By: Kristine Garbe M.D.   On: 09/21/2017 05:46    Procedures Procedures (including critical care time)  Medications Ordered in ED Medications  morphine 4 MG/ML injection 4 mg (4 mg Intravenous Given 09/21/17 0602)  alum & mag hydroxide-simeth (MAALOX/MYLANTA) 200-200-20 MG/5ML suspension 30 mL (30 mLs Oral Given 09/21/17 0602)  iopamidol (  ISOVUE-370) 76 % injection (100 mLs  Contrast Given 09/21/17 0644)  morphine 4 MG/ML injection 4 mg (4 mg Intravenous Given 09/21/17 0639)  ondansetron (ZOFRAN) injection 4 mg (4 mg Intravenous Given 09/21/17 0718)     Initial Impression / Assessment and Plan / ED Course  I have reviewed the triage vital signs and the nursing notes.  Pertinent labs & imaging results that were available during my care of the patient were reviewed by me and considered in my medical decision making (see chart for details).     Given central chest pain with radiation to the back and acute in onset she will undergo CT angios chest abdomen pelvis to evaluate for aortic injury/dissection.  EKG without ischemic changes.  Patient will need a second troponin given her age.  Care to Dr Regenia Skeeter  Final Clinical Impressions(s) / ED Diagnoses   Final diagnoses:  None    ED Discharge Orders    None       Jola Schmidt, MD 09/21/17 [phone]

## 2017-09-21 NOTE — ED Notes (Signed)
Patient to floor now

## 2017-09-21 NOTE — ED Triage Notes (Signed)
Pt from home via [facility]. C/o centralized CP w/ SHOB, back pain & lightheadedness x1 hr. Sts has had a cough x few months now. Denies any other cardiac hx. Pt took 324 ASA & was given 2 NTG by EMS PTA. Rates pain @ 8/10 @ this time. A&O x4 on arrival.

## 2017-09-21 NOTE — H&P (Signed)
History and Physical    Michelle Gonzales:[id number] DOB: 10/28/1940 DOA: 09/21/2017   PCP: [name], MD   Patient coming from:  Home    Chief Complaint:  HPI: Michelle Gonzales is a 77 y.o. female with medical history significant for right breast cancer on tamoxifen, history of tobacco abuse, history of Sjogren's disease not on therapy, sent into the emergency department by EMS, after experiencing substernal chest pain, described as tight, squeezing, radiating throughout her back, without radiation to her arms or neck or jaw.  The pain began around 330 this morning, as she was going to the restroom.  The pain has not completely gone away, although one nitroglycerin at the ED, help relieve somehow this symptoms.  She also received morphine, and Maalox without significant changes.. Pain notworsened with deep inspiration, or exertion. She reports being able to reproduce her pain with movement.  She states that yesterday has come to the [facility] after several weeks of not exercising.The patient reports having these symptoms in the past, one at which time it was felt to be related to cholelithiasis, undergoing cholecystectomy, and another time about 8 years ago, for which she had a cardiac catheterization, which per her report, was negative Denies any dizziness or falls. No syncope or presyncope.  Denies shortness of breath or cough or sick contacts.  Denies any fever or chills. Reports some nausea after pain meds given, but none with the current symptoms  No vomiting or abdominal pain. Appetite is normal andeats salt rich foods. Denies any leg swelling or calf pain. Denies any headaches or vision changes or seizures No confusion reported.  No recent long distance trips. Denies any new stressors. No new meds.  No new herbal supplements.  No ETOH or recreational drugs. NO family history of cardiac disease    ED Course:  BP 107/69   Pulse 87   Temp 98.3 F (36.8 C) (Oral)   Resp 18   Ht 5\' 1"  (1.549 m)    Wt 72.6 kg (160 lb)   SpO2 92%   BMI 30.23 kg/m    Troponin negative x2. CT angio negative for aortic dissection.  No evidence of pulmonary embolism.  No osseous lesions. WBC 20.8.  Hemoglobin 13.9.  Platelets 177 Sodium 137, potassium 4.2.  Chloride 102.  Bicarb 25.  Glucose 124.  Creatinine 0.82.   GFR is greater than 60.  Calcium 8.5. Received Morphine, ASA And NTG with minimal response   Review of Systems:  As per HPI otherwise all other systems reviewed and are negative  Past Medical History:  Diagnosis Date  . Allergy    Pcn, Sulfa, Codeine  . Anxiety   . Arthritis   . Cataract    surgey b/l  . COPD (chronic obstructive pulmonary disease) ([place])   . Cough   . Depression    hx years ago  . Hot flashes   . Shortness of breath   . Sjogren's disease [facility])     Past Surgical History:  Procedure Laterality Date  . ABDOMINAL HYSTERECTOMY  74   partial  . BREAST LUMPECTOMY WITH NEEDLE LOCALIZATION AND AXILLARY SENTINEL LYMPH NODE BX Right 02/01/2013   Procedure: BREAST LUMPECTOMY WITH NEEDLE LOCALIZATION AND AXILLARY SENTINEL LYMPH NODE BX;  Surgeon: Odis Hollingshead, MD;  Location: [place];  Service: General;  Laterality: Right;  . BREAST SURGERY Right 1965   biopsy  . CERVICAL FUSION  2010  . CHOLECYSTECTOMY  2002  . COLONOSCOPY    .  DILATION AND CURETTAGE OF UTERUS      Social History Social History   Socioeconomic History  . Marital status: Married    Spouse name: Not on file  . Number of children: Not on file  . Years of education: Not on file  . Highest education level: Not on file  Social Needs  . Financial resource strain: Not on file  . Food insecurity - worry: Not on file  . Food insecurity - inability: Not on file  . Transportation needs - medical: Not on file  . Transportation needs - non-medical: Not on file  Occupational History  . Not on file  Tobacco Use  . Smoking status: Current Every Day Smoker    Packs/day: 0.50     Types: Cigarettes  . Smokeless tobacco: Never Used  Substance and Sexual Activity  . Alcohol use: Yes    Comment: rare  . Drug use: No  . Sexual activity: Yes  Other Topics Concern  . Not on file  Social History Narrative  . Not on file     Allergies  Allergen Reactions  . Demerol [Meperidine] Nausea And Vomiting and Other (See Comments)    Bad headache ,  Nausea/Vomiting.  Marland Kitchen Penicillins Anaphylaxis  . Codeine Other (See Comments)    Massive headache  . Sulfa Antibiotics Itching and Rash    Family History  Problem Relation Age of Onset  . Breast cancer Mother   . Cancer Mother        breast      Prior to Admission medications   Medication Sig Start Date End Date Taking? Authorizing Provider  azelastine (ASTELIN) 137 MCG/SPRAY nasal spray Place 1 spray into the nose 2 (two) times daily. Use in each nostril as directed    [provider]  beta carotene w/minerals (OCUVITE) tablet Take 1 tablet by mouth daily.    [provider]  cholecalciferol (VITAMIN D) 1000 UNITS tablet Take 1,000 Units by mouth daily.    [provider]  Fluticasone-Salmeterol (ADVAIR) 100-50 MCG/DOSE AEPB Inhale 2 puffs into the lungs every morning.    [provider]  montelukast (SINGULAIR) 10 MG tablet Take 10 mg by mouth at bedtime.  02/01/13   [provider]  Multiple Vitamins-Minerals (CENTRUM PO) Take by mouth daily.    [provider]  OVER THE COUNTER MEDICATION 1 tablet daily. Osteo biflex    [provider]  tamoxifen (NOLVADEX) 20 MG tablet Take 1 tablet (20 mg total) daily by mouth. 06/10/17   Truitt Merle, MD  vitamin C (ASCORBIC ACID) 250 MG tablet Take 250 mg by mouth daily.    [provider]    Physical Exam:  Vitals:   09/21/17 0800 09/21/17 0900 09/21/17 0915 09/21/17 0930  BP: 140/76 126/80 119/82 107/69  Pulse: 86 90 89 87  Resp: 17 18 20 18   Temp:      TempSrc:      SpO2: 93% 91% 94% 92%  Weight:        Height:       Constitutional: NAD, calm, somewhat anxious  Eyes: PERRL, lids and conjunctivae normal ENMT: Mucous membranes are moist, without exudate or lesions  Neck: normal, supple, no masses, no thyromegaly Respiratory: clear to auscultation bilaterally, no wheezing, no crackles. Normal respiratory effort  Cardiovascular: Regular rate and rhythm,  murmur, rubs or gallops. No extremity edema. 2+ pedal pulses. No carotid bruits.  Abdomen: Soft, non tender, No hepatosplenomegaly. Bowel sounds positive.  Musculoskeletal: no  clubbing / cyanosis. Moves all extremities. Appears to be tender at the costochondral area bilaterally  Skin: no jaundice, No lesions.  Neurologic: Sensation intact  Strength equal in all extremities Psychiatric:   Alert and oriented x 3. Normal behavior   Labs on Admission: I have personally reviewed following labs and imaging studies  CBC: Recent Labs  Lab 09/21/17 0457  WBC 20.8*  HGB 13.9  HCT 42.0  MCV 94.0  PLT 355    Basic Metabolic Panel: Recent Labs  Lab 09/21/17 0457  NA 137  K 4.2  CL 102  CO2 25  GLUCOSE 124*  BUN 9  CREATININE 0.82  CALCIUM 8.5*    GFR: Estimated Creatinine Clearance: 54 mL/min (by C-G formula based on SCr of 0.82 mg/dL).  Liver Function Tests: No results for input(s): AST, ALT, ALKPHOS, BILITOT, PROT, ALBUMIN in the last 168 hours. No results for input(s): LIPASE, AMYLASE in the last 168 hours. No results for input(s): AMMONIA in the last 168 hours.  Coagulation Profile: No results for input(s): INR, PROTIME in the last 168 hours.  Cardiac Enzymes: No results for input(s): CKTOTAL, CKMB, CKMBINDEX, TROPONINI in the last 168 hours.  BNP (last 3 results) No results for input(s): PROBNP in the last 8760 hours.  HbA1C: No results for input(s): HGBA1C in the last 72 hours.  CBG: No results for input(s): GLUCAP in the last 168 hours.  Lipid Profile: No results for input(s): CHOL, HDL, LDLCALC, TRIG,  CHOLHDL, LDLDIRECT in the last 72 hours.  Thyroid Function Tests: No results for input(s): TSH, T4TOTAL, FREET4, T3FREE, THYROIDAB in the last 72 hours.  Anemia Panel: No results for input(s): VITAMINB12, FOLATE, FERRITIN, TIBC, IRON, RETICCTPCT in the last 72 hours.  Urine analysis: No results found for: COLORURINE, APPEARANCEUR, LABSPEC, PHURINE, GLUCOSEU, HGBUR, BILIRUBINUR, KETONESUR, PROTEINUR, UROBILINOGEN, NITRITE, LEUKOCYTESUR  Sepsis Labs: @LABRCNTIP (procalcitonin:4,lacticidven:4) )No results found for this or any previous visit (from the past 240 hour(s)).   Radiological Exams on Admission: Dg Chest 2 View  Result Date: 09/21/2017 CLINICAL DATA:  77 y/o F; centralized chest pain and shortness of breath. EXAM: CHEST  2 VIEW COMPARISON:  01/27/2013 chest radiograph FINDINGS: Stable normal cardiac silhouette given projection and technique. Reticular opacities of the lungs. No focal consolidation, effusion, or pneumothorax. Stable chronic T5 compression deformity. No acute osseous abnormality identified. Anterior cervical fusion hardware noted. IMPRESSION: Reticular opacities of the lungs, probably bronchitic changes. No consolidation. Stable cardiac silhouette. Electronically Signed   By: Kristine Garbe M.D.   On: 09/21/2017 05:46   Ct Angio Chest/abd/pel For Dissection W And/or Wo Contrast  Result Date: 09/21/2017 CLINICAL DATA:  Chest pain radiating to back EXAM: CT ANGIOGRAPHY CHEST, ABDOMEN AND PELVIS TECHNIQUE: Multidetector CT imaging through the chest, abdomen and pelvis was performed using the standard protocol during bolus administration of intravenous contrast. Multiplanar reconstructed images and MIPs were obtained and reviewed to evaluate the vascular anatomy. CONTRAST:  138mL ISOVUE-370 IOPAMIDOL (ISOVUE-370) INJECTION 76% COMPARISON:  Chest radiograph dated 09/21/2017. FINDINGS: CTA CHEST FINDINGS Cardiovascular: On unenhanced CT, there is no evidence of mediastinal  hematoma. Preferential opacification of the thoracic aorta. No evidence of thoracic aortic aneurysm or dissection. Mild atherosclerotic calcifications of the aortic arch. Although not tailored for evaluation of the pulmonary arteries, there is no evidence of pulmonary embolism to the segmental level. The heart is normal in size.  No pericardial effusion. Mediastinum/Nodes: No suspicious mediastinal lymphadenopathy. Visualized thyroid is unremarkable. Mild centrilobular emphysematous changes, upper lobe predominant. Lungs/Pleura: Mild dependent atelectasis  in the bilateral lower lobes. No focal consolidation. No suspicious pulmonary nodules. No pleural effusion or pneumothorax. Musculoskeletal: Prior vertebral augmentation at T5. Review of the MIP images confirms the above findings. CTA ABDOMEN AND PELVIS FINDINGS VASCULAR Aorta: No evidence of abdominal aortic aneurysm or dissection. Atherosclerotic calcifications. Celiac: Patent. SMA: Patent. Renals: Patent bilaterally, with mild atherosclerotic calcifications at the origin. IMA: Patent. Inflow: Patent, with atherosclerotic calcifications. Veins: Unremarkable. Review of the MIP images confirms the above findings. NON-VASCULAR Hepatobiliary: Liver is grossly unremarkable. Status post cholecystectomy. No intrahepatic or extrahepatic ductal dilatation. Pancreas: Within normal limits. Spleen: Within normal limits. Adrenals/Urinary Tract: Adrenal glands are within normal limits. Left renal cysts, including a dominant 3.2 cm interpolar cyst (series 6/image 149). Suspected nonobstructing right renal calculi measuring 2-3 mm (series 6/image 153). No hydronephrosis. Bladder is within normal limits. Stomach/Bowel: Stomach is within normal limits. No evidence of bowel obstruction. Normal appendix (series 6/image 230). Lymphatic: No suspicious abdominopelvic lymphadenopathy. Reproductive: Status post hysterectomy. Bilateral ovaries are within normal limits. Other: No  abdominopelvic ascites. Small fat containing right inguinal hernia. Musculoskeletal: No focal osseous lesions. Review of the MIP images confirms the above findings. IMPRESSION: No evidence of thoracoabdominal aortic aneurysm or dissection. No evidence of pulmonary embolism. Suspected nonobstructing right renal calculi measuring 2-3 mm. No hydronephrosis. Additional ancillary findings as above. Electronically Signed   By: Julian Hy M.D.   On: 09/21/2017 07:27    EKG: Independently reviewed.  Assessment/Plan Principal Problem:   Chest pain Active Problems:   Breast cancer of upper-outer quadrant of right female breast (HCC)   Allergy   COPD (chronic obstructive pulmonary disease) (HCC)   Tobacco abuse   Anxiety   Depression   Leukocytosis    Chest pain syndrome, cardiac versus musculoskeletal vs anxiety HEART score 4  . Troponin neg x 2 , EKG without evidence of acute changes. CP somewhat relieved by nitroglycerin but not resolved CT angio neg for dissection, PE or active disease  Admit to Telemetry/ Observation Chest pain order set Cycle troponins EKG in am continue ASA, O2 and NTG as needed GI cocktail Check Lipid panel  Hb A1C Add Toradol for musculoskeletal pain coverage  If results negative she may need a stress test as outpatient   Tobacco abuse with nicotine withdrawal  Nicotine patch Counseled cessation  Elevated Glucose Patient noted to have elevated Glucose in labs at 124 . No prior documented history of DM Check A1C Check CBG bid    Leukocytosis, likely reactive in the setting of pain. Patient h/o Sjogren's, chronic allergies and tobacco  Afebrile . Patient not on steroids  Add differential and save smear    IVF 75 cc/h   Repeat CBC in AM Check urinalysis  History of breast cancer, on Tamoxifen, no acute issues Continue tamoxifen Follow with Dr. Burr Medico as outpatient   Chronic allergies Continue home meds and nasal sprays     DVT prophylaxis:   Lovenox Code Status:    Full  Family Communication:  Discussed with patient Disposition Plan: Expect patient to be discharged to home after condition improves Consults called:    None  Admission status: Tele Obs    Sharene Butters, PA-C [facility]   Amion text  [phone]   09/21/2017, 9:41 AM

## 2017-09-21 NOTE — Progress Notes (Signed)
Brief update:  Discussed the case with Dr. Meda Coffee, Cardiology.  Dr. Meda Coffee agrees that based on the results, with negative troponinsx3, unremarkable EKG, the chances of patient developing MI are small.  Recommended continued observation today, and will be glad to follow the patient at the clinic.  She stated that the office will call the patient for her to be seen this week, for further testing.   Spoke with patient and her husband, who agreed with the plan. Sharene Butters, PA-C

## 2017-09-21 NOTE — ED Notes (Signed)
Patient transported to CT 

## 2017-09-21 NOTE — ED Provider Notes (Signed)
Care transferred to me.  Patient second troponin is negative.  Her ECGs are unchanging.  However she is still complaining of chest pain that is squeezing and going to her back.  Nitroglycerin had some moderate relief.  She was given more when her pressure comes back up as it is currently 416 systolic.  Will apply Nitropaste.  Unclear if this is cardiac but I also have no other good reason for her chest pain.  She will be admitted to the hospitalist service for ACS rule out.   Sherwood Gambler, MD 09/21/17 [phone]

## 2017-09-21 NOTE — Progress Notes (Signed)
Troponin resulted in 0.04. Notified attending provider. No new orders at this time. Will continue to monitor.

## 2017-09-22 ENCOUNTER — Observation Stay (HOSPITAL_BASED_OUTPATIENT_CLINIC_OR_DEPARTMENT_OTHER): Payer: Medicare Other

## 2017-09-22 ENCOUNTER — Encounter (HOSPITAL_COMMUNITY): Payer: Self-pay | Admitting: Internal Medicine

## 2017-09-22 DIAGNOSIS — J449 Chronic obstructive pulmonary disease, unspecified: Secondary | ICD-10-CM

## 2017-09-22 DIAGNOSIS — I481 Persistent atrial fibrillation: Secondary | ICD-10-CM | POA: Diagnosis not present

## 2017-09-22 DIAGNOSIS — I4891 Unspecified atrial fibrillation: Secondary | ICD-10-CM | POA: Diagnosis not present

## 2017-09-22 DIAGNOSIS — Z720 Tobacco use: Secondary | ICD-10-CM | POA: Diagnosis not present

## 2017-09-22 DIAGNOSIS — I480 Paroxysmal atrial fibrillation: Secondary | ICD-10-CM

## 2017-09-22 DIAGNOSIS — R079 Chest pain, unspecified: Secondary | ICD-10-CM

## 2017-09-22 DIAGNOSIS — R9439 Abnormal result of other cardiovascular function study: Secondary | ICD-10-CM | POA: Diagnosis not present

## 2017-09-22 HISTORY — DX: Unspecified atrial fibrillation: I48.91

## 2017-09-22 LAB — ECHOCARDIOGRAM COMPLETE
Height: 61 in
Weight: 2576 oz

## 2017-09-22 LAB — BASIC METABOLIC PANEL
Anion gap: 8 (ref 5–15)
BUN: 9 mg/dL (ref 6–20)
CO2: 21 mmol/L — ABNORMAL LOW (ref 22–32)
Calcium: 7.7 mg/dL — ABNORMAL LOW (ref 8.9–10.3)
Chloride: 104 mmol/L (ref 101–111)
Creatinine, Ser: 0.69 mg/dL (ref 0.44–1.00)
GFR calc Af Amer: >60 mL/min (ref >60)
GFR calc non Af Amer: >60 mL/min (ref >60)
Glucose, Bld: 119 mg/dL — ABNORMAL HIGH (ref 65–99)
Potassium: 4.1 mmol/L (ref 3.5–5.1)
Sodium: 133 mmol/L — ABNORMAL LOW (ref 135–145)

## 2017-09-22 LAB — CBC
HCT: 37.6 % (ref 36.0–46.0)
Hemoglobin: 12.3 g/dL (ref 12.0–15.0)
MCH: 30.8 pg (ref 26.0–34.0)
MCHC: 32.7 g/dL (ref 30.0–36.0)
MCV: 94.2 fL (ref 78.0–100.0)
Platelets: 156 10*3/uL (ref 150–400)
RBC: 3.99 MIL/uL (ref 3.87–5.11)
RDW: 13.3 % (ref 11.5–15.5)
WBC: 13.1 10*3/uL — ABNORMAL HIGH (ref 4.0–10.5)

## 2017-09-22 LAB — HEPARIN LEVEL (UNFRACTIONATED): Heparin Unfractionated: 0.57 IU/mL (ref 0.30–0.70)

## 2017-09-22 LAB — TSH: TSH: 1.202 u[IU]/mL (ref 0.350–4.500)

## 2017-09-22 MED ORDER — HEPARIN (PORCINE) IN NACL 100-0.45 UNIT/ML-% IJ SOLN
950.0000 [IU]/h | INTRAMUSCULAR | Status: DC
  Administered 2017-09-22 – 2017-09-24 (×3): 1000 [IU]/h via INTRAVENOUS
  Filled 2017-09-22 (×3): qty 250

## 2017-09-22 MED ORDER — HEPARIN BOLUS VIA INFUSION
4000.0000 [IU] | Freq: Once | INTRAVENOUS | Status: AC
  Administered 2017-09-22: 4000 [IU] via INTRAVENOUS
  Filled 2017-09-22: qty 4000

## 2017-09-22 MED ORDER — METOPROLOL TARTRATE 5 MG/5ML IV SOLN
5.0000 mg | Freq: Once | INTRAVENOUS | Status: AC
  Administered 2017-09-22: 5 mg via INTRAVENOUS
  Filled 2017-09-22: qty 5

## 2017-09-22 MED ORDER — DILTIAZEM LOAD VIA INFUSION
10.0000 mg | Freq: Once | INTRAVENOUS | Status: AC
  Administered 2017-09-22: 10 mg via INTRAVENOUS
  Filled 2017-09-22: qty 10

## 2017-09-22 MED ORDER — DILTIAZEM HCL-DEXTROSE 100-5 MG/100ML-% IV SOLN (PREMIX)
5.0000 mg/h | INTRAVENOUS | Status: DC
Start: 2017-09-22 — End: 2017-09-23
  Administered 2017-09-22: 5 mg/h via INTRAVENOUS
  Administered 2017-09-22 – 2017-09-23 (×2): 7.5 mg/h via INTRAVENOUS
  Filled 2017-09-22 (×3): qty 100

## 2017-09-22 NOTE — Progress Notes (Signed)
Patient to and from bedside commode with RN assistance x1.  During that activity patient's heart rate into the 140's, patient asymptomatic.  Once patient resting back in bed heart rate primarily 90-110's.

## 2017-09-22 NOTE — Progress Notes (Signed)
ANTICOAGULATION CONSULT NOTE - follow up Pharmacy Consult for Heparin Indication: atrial fibrillation  Allergies  Allergen Reactions  . Demerol [Meperidine] Nausea And Vomiting and Other (See Comments)    Bad headache ,  Nausea/Vomiting.  Marland Kitchen Penicillins     Has patient had a PCN reaction causing immediate rash, facial/tongue/throat swelling, SOB or lightheadedness with hypotension: YES Has patient had a PCN reaction causing severe rash involving mucus membranes or skin necrosis: NO Has patient had a PCN reaction that required hospitalization: NO Has patient had a PCN reaction occurring within the last 10 years: NO If all of the above answers are "NO", then may proceed with Cephalosporin use.   . Codeine Other (See Comments)    Massive headache  . Sulfa Antibiotics Itching and Rash    Patient Measurements: Height: 5\' 1"  (154.9 cm) Weight: 161 lb (73 kg) IBW/kg (Calculated) : 47.8 Heparin Dosing Weight: 64 kg  Vital Signs: Temp: 97.6 F (36.4 C) (03/04 1330) Temp Source: Oral (03/04 1330) BP: 114/71 (03/04 1330) Pulse Rate: 86 (03/04 1330)  Labs: Recent Labs    09/21/17 0457 09/21/17 1001 09/21/17 1505 09/21/17 1841 09/22/17 0516 09/22/17 1801  HGB 13.9  --   --   --  12.3  --   HCT 42.0  --   --   --  37.6  --   PLT 177  --   --   --  156  --   HEPARINUNFRC  --   --   --   --   --  0.57  CREATININE 0.82  --   --   --  0.69  --   TROPONINI  --  <0.03 0.04* <0.03  --   --     Estimated Creatinine Clearance: 55.5 mL/min (by C-G formula based on SCr of 0.69 mg/dL).   Medical History: Past Medical History:  Diagnosis Date  . Allergy    Pcn, Sulfa, Codeine  . Anxiety   . Arthritis   . Atrial fibrillation ([place]) 09/22/2017  . Cataract    surgey b/l  . COPD (chronic obstructive pulmonary disease) ([place])   . Cough   . Depression    hx years ago  . Hot flashes   . Shortness of breath   . Sjogren's disease [facility])    Assessment:   77 yr old female to begin IV  heparin for atrial fibrillation. Admitted 3/3 with chest discomfort. CTA negative for PE or dissection. Troponins negative.   On ASA 81 mg at home, currently on ASA 325 mg daily.  Initial ~ 7 hour heparin level = 0.57 on heparin drip 1000 units/hr. Therapeutic level. No bleeding noted.  Goal of Therapy:  Heparin level 0.3-0.7 units/ml Monitor platelets by anticoagulation protocol: Yes   Plan:   Continue IV Heparin drip at 1000 units/hr. Heparin level ~6 hrs to confirm remain in therapeutic range. Daily heparin level and CBC while on heparin.  Nicole Cella, RPh Clinical Pharmacist Pager: [phone] 09/22/2017,7:04 PM

## 2017-09-22 NOTE — Progress Notes (Signed)
  Echocardiogram 2D Echocardiogram has been performed.  Michelle Gonzales T [name] 09/22/2017, 10:11 AM

## 2017-09-22 NOTE — Care Management Obs Status (Signed)
[place] NOTIFICATION   Patient Details  Name: LILEE ALDEA MRN: [id number] Date of Birth: 05/18/1941   Medicare Observation Status Notification Given:  Yes    Bethena Roys, RN 09/22/2017, 2:08 PM

## 2017-09-22 NOTE — Progress Notes (Signed)
ANTICOAGULATION CONSULT NOTE - Initial Consult  Pharmacy Consult for Heparin Indication: atrial fibrillation  Allergies  Allergen Reactions  . Demerol [Meperidine] Nausea And Vomiting and Other (See Comments)    Bad headache ,  Nausea/Vomiting.  Marland Kitchen Penicillins     Has patient had a PCN reaction causing immediate rash, facial/tongue/throat swelling, SOB or lightheadedness with hypotension: YES Has patient had a PCN reaction causing severe rash involving mucus membranes or skin necrosis: NO Has patient had a PCN reaction that required hospitalization: NO Has patient had a PCN reaction occurring within the last 10 years: NO If all of the above answers are "NO", then may proceed with Cephalosporin use.   . Codeine Other (See Comments)    Massive headache  . Sulfa Antibiotics Itching and Rash    Patient Measurements: Height: 5\' 1"  (154.9 cm) Weight: 161 lb (73 kg) IBW/kg (Calculated) : 47.8 Heparin Dosing Weight: 64 kg  Vital Signs: Temp: 98.4 F (36.9 C) (03/04 0131) Temp Source: Oral (03/04 0131) BP: 136/74 (03/04 0404) Pulse Rate: 109 (03/04 0404)  Labs: Recent Labs    09/21/17 0457 09/21/17 1001 09/21/17 1505 09/21/17 1841 09/22/17 0516  HGB 13.9  --   --   --  12.3  HCT 42.0  --   --   --  37.6  PLT 177  --   --   --  156  CREATININE 0.82  --   --   --  0.69  TROPONINI  --  <0.03 0.04* <0.03  --     Estimated Creatinine Clearance: 55.5 mL/min (by C-G formula based on SCr of 0.69 mg/dL).   Medical History: Past Medical History:  Diagnosis Date  . Allergy    Pcn, Sulfa, Codeine  . Anxiety   . Arthritis   . Cataract    surgey b/l  . COPD (chronic obstructive pulmonary disease) ([place])   . Cough   . Depression    hx years ago  . Hot flashes   . Shortness of breath   . Sjogren's disease [facility])    Assessment:   77 yr old female to begin IV heparin for atrial fibrillation. Admitted 3/3 with chest discomfort. CTA negative for PE or dissection. Troponins  negative.   On ASA 81 mg at home, currently on ASA 325 mg daily.   Lovenox 40 mg sq given on 3/3 at ~3:30pm.  Goal of Therapy:  Heparin level 0.3-0.7 units/ml Monitor platelets by anticoagulation protocol: Yes   Plan:    Discontinue Lovenox.  Heparin 4000 units IV x 1.  Heparin drip to begin at 1000 units/hr.  Heparin level ~8 hrs after drip begins.  Daily heparin level and CBC while on heparin.  Arty Baumgartner , [place] Pager: [phone], or [phone] 09/22/2017,10:06 AM

## 2017-09-22 NOTE — Consult Note (Addendum)
Cardiology Consultation:   Patient ID: Michelle Gonzales; [id number]; Mar 08, 1941   Admit date: 09/21/2017 Date of Consult: 09/22/2017  Primary Care Provider: [name], MD Primary Cardiologist: New, [name]  Primary Electrophysiologist:     Patient Profile:   Michelle Gonzales is a 77 y.o. female with a hx of Right breast cancer ( stage 1)  who is being seen today for the evaluation of atrial fib and chest pain  at the request of  Dr. Eliseo Squires. .  History of Present Illness:   Michelle Gonzales is a 77 year old female with a history of right breast cancer-stage I.  She was seen in the emergency room yesterday for episodes of chest discomfort.  The patient has a history of chest pains in the past.  She had a negative heart catheterization approximately 8 years ago.  She presented with several days of chest discomfort. .    Troponins remain negative.   Chest pain is midsternal.  It radiates through to her back and typically last for hours.  It is not worsened with exercise, eating or drinking, or taking a deep breath.  It is also not worsened by twisting or turning her torso.  EKG this morning shows atrial fibrillation with a heart rate of 121.  She has no ST or T wave changes.  She exercises on a fairly regular basis.  She works out 3 times a week at [employer].  She denies any episodes of chest pain with her previous workouts.    Past Medical History:  Diagnosis Date  . Allergy    Pcn, Sulfa, Codeine  . Anxiety   . Arthritis   . Cataract    surgey b/l  . COPD (chronic obstructive pulmonary disease) ([place])   . Cough   . Depression    hx years ago  . Hot flashes   . Shortness of breath   . Sjogren's disease [facility])     Past Surgical History:  Procedure Laterality Date  . ABDOMINAL HYSTERECTOMY  74   partial  . BREAST LUMPECTOMY WITH NEEDLE LOCALIZATION AND AXILLARY SENTINEL LYMPH NODE BX Right 02/01/2013   Procedure: BREAST LUMPECTOMY WITH NEEDLE LOCALIZATION AND AXILLARY SENTINEL LYMPH  NODE BX;  Surgeon: Odis Hollingshead, MD;  Location: [place];  Service: General;  Laterality: Right;  . BREAST SURGERY Right 1965   biopsy  . CERVICAL FUSION  2010  . CHOLECYSTECTOMY  2002  . COLONOSCOPY    . DILATION AND CURETTAGE OF UTERUS       Home Medications:  Prior to Admission medications   Medication Sig Start Date End Date Taking? Authorizing Provider  aspirin 81 MG chewable tablet Chew 162 mg by mouth daily as needed for mild pain.   Yes [provider]  azelastine (ASTELIN) 137 MCG/SPRAY nasal spray Place 3 sprays into the nose daily. Use in each nostril as directed    Yes [provider]  beta carotene w/minerals (OCUVITE) tablet Take 1 tablet by mouth daily.   Yes [provider]  Carboxymethylcellul-Glycerin (LUBRICATING EYE DROPS OP) Place 1 drop into both eyes daily as needed (Dry Eyes).   Yes [provider]  cholecalciferol (VITAMIN D) 1000 units tablet Take 1,000 Units by mouth daily.    Yes [provider]  Fluticasone-Salmeterol (ADVAIR) 100-50 MCG/DOSE AEPB Inhale 2 puffs into the lungs every morning.   Yes [provider]  montelukast (SINGULAIR) 10 MG tablet Take 10 mg by mouth at bedtime.  02/01/13  Yes  [provider]  naproxen sodium (ALEVE) 220 MG tablet Take 220 mg by mouth daily as needed (Headache).   Yes [provider]  OVER THE COUNTER MEDICATION 1 tablet daily. Osteo biflex   Yes [provider]  tamoxifen (NOLVADEX) 20 MG tablet Take 1 tablet (20 mg total) daily by mouth. 06/10/17  Yes Truitt Merle, MD  vitamin C (ASCORBIC ACID) 500 MG tablet Take 500 mg by mouth daily.    Yes [provider]    Inpatient Medications: Scheduled Meds: . aspirin EC  325 mg Oral Daily  . azelastine  1 spray Each Nare BID  . cholecalciferol  1,000 Units Oral Daily  . enoxaparin (LOVENOX) injection  40 mg Subcutaneous Q24H  . mometasone-formoterol  2 puff Inhalation  BID  . montelukast  10 mg Oral QHS  . nicotine  7 mg Transdermal Daily  . tamoxifen  20 mg Oral Daily   Continuous Infusions: . sodium chloride 75 mL/hr at 09/22/17 0358   PRN Meds: acetaminophen, ALPRAZolam, gi cocktail, hydrALAZINE, ketorolac, morphine injection, ondansetron (ZOFRAN) IV, oxyCODONE-acetaminophen  Allergies:    Allergies  Allergen Reactions  . Demerol [Meperidine] Nausea And Vomiting and Other (See Comments)    Bad headache ,  Nausea/Vomiting.  Marland Kitchen Penicillins     Has patient had a PCN reaction causing immediate rash, facial/tongue/throat swelling, SOB or lightheadedness with hypotension: YES Has patient had a PCN reaction causing severe rash involving mucus membranes or skin necrosis: NO Has patient had a PCN reaction that required hospitalization: NO Has patient had a PCN reaction occurring within the last 10 years: NO If all of the above answers are "NO", then may proceed with Cephalosporin use.   . Codeine Other (See Comments)    Massive headache  . Sulfa Antibiotics Itching and Rash    Social History:   Social History   Socioeconomic History  . Marital status: Married    Spouse name: Not on file  . Number of children: Not on file  . Years of education: Not on file  . Highest education level: Not on file  Social Needs  . Financial resource strain: Not on file  . Food insecurity - worry: Not on file  . Food insecurity - inability: Not on file  . Transportation needs - medical: Not on file  . Transportation needs - non-medical: Not on file  Occupational History  . Not on file  Tobacco Use  . Smoking status: Current Every Day Smoker    Packs/day: 0.50    Types: Cigarettes  . Smokeless tobacco: Never Used  Substance and Sexual Activity  . Alcohol use: Yes    Comment: rare  . Drug use: No  . Sexual activity: Yes  Other Topics Concern  . Not on file  Social History Narrative  . Not on file    Family History:    Family History  Problem  Relation Age of Onset  . Breast cancer Mother   . Cancer Mother        breast     ROS:  Please see the history of present illness.   All other ROS reviewed and negative.     Physical Exam/Data:   Vitals:   09/22/17 0349 09/22/17 0355 09/22/17 0404 09/22/17 0803  BP: (!) 153/89  136/74   Pulse: (!) 114  (!) 109   Resp: (!) 22     Temp:      TempSrc:      SpO2: 93% 94% 95% 97%  Weight:      Height:        Intake/Output Summary (Last 24 hours) at 09/22/2017 0811 Last data filed at 09/22/2017 0700 Gross per 24 hour  Intake 1586.25 ml  Output -  Net 1586.25 ml   Filed Weights   09/21/17 0457 09/21/17 1434 09/22/17 0341  Weight: 160 lb (72.6 kg) 158 lb 4.8 oz (71.8 kg) 161 lb (73 kg)   Body mass index is 30.42 kg/m.  General: Elderly female, no acute distress HEENT: normal Lymph: no adenopathy Neck: no JVD Endocrine:  No thryomegaly Vascular: No carotid bruits; FA pulses 2+ bilaterally without bruits  Cardiac: Irregularly irregular.  Soft systolic murmur. Lungs:  clear to auscultation bilaterally, no wheezing, rhonchi or rales  Abd: soft, nontender, no hepatomegaly  Ext: no edema Musculoskeletal:  No deformities, BUE and BLE strength normal and equal Skin: warm and dry  Neuro:  CNs 2-12 intact, no focal abnormalities noted Psych:  Normal affect   EKG:  The EKG was personally reviewed and demonstrates: Atrial for ablation with a heart rate of 121.  She has no ST or T wave changes. Telemetry:  Telemetry was personally reviewed and demonstrates:  Relevant CV Studies:   Laboratory Data:  Chemistry Recent Labs  Lab 09/21/17 0457 09/22/17 0516  NA 137 133*  K 4.2 4.1  CL 102 104  CO2 25 21*  GLUCOSE 124* 119*  BUN 9 9  CREATININE 0.82 0.69  CALCIUM 8.5* 7.7*  GFRNONAA >60 >60  GFRAA >60 >60  ANIONGAP 10 8    No results for input(s): PROT, ALBUMIN, AST, ALT, ALKPHOS, BILITOT in the last 168 hours. Hematology Recent Labs  Lab 09/21/17 0457  09/22/17 0516  WBC 20.8* 13.1*  RBC 4.47 3.99  HGB 13.9 12.3  HCT 42.0 37.6  MCV 94.0 94.2  MCH 31.1 30.8  MCHC 33.1 32.7  RDW 13.2 13.3  PLT 177 156   Cardiac Enzymes Recent Labs  Lab 09/21/17 1001 09/21/17 1505 09/21/17 1841  TROPONINI <0.03 0.04* <0.03    Recent Labs  Lab 09/21/17 0505 09/21/17 0819  TROPIPOC 0.01 0.00    BNPNo results for input(s): BNP, PROBNP in the last 168 hours.  DDimer No results for input(s): DDIMER in the last 168 hours.  Radiology/Studies:  Dg Chest 2 View  Result Date: 09/21/2017 CLINICAL DATA:  77 y/o F; centralized chest pain and shortness of breath. EXAM: CHEST  2 VIEW COMPARISON:  01/27/2013 chest radiograph FINDINGS: Stable normal cardiac silhouette given projection and technique. Reticular opacities of the lungs. No focal consolidation, effusion, or pneumothorax. Stable chronic T5 compression deformity. No acute osseous abnormality identified. Anterior cervical fusion hardware noted. IMPRESSION: Reticular opacities of the lungs, probably bronchitic changes. No consolidation. Stable cardiac silhouette. Electronically Signed   By: Kristine Garbe M.D.   On: 09/21/2017 05:46   Ct Angio Chest/abd/pel For Dissection W And/or Wo Contrast  Result Date: 09/21/2017 CLINICAL DATA:  Chest pain radiating to back EXAM: CT ANGIOGRAPHY CHEST, ABDOMEN AND PELVIS TECHNIQUE: Multidetector CT imaging through the chest, abdomen and pelvis was performed using the standard protocol during bolus administration of intravenous contrast. Multiplanar reconstructed images and MIPs were obtained and reviewed to evaluate the vascular anatomy. CONTRAST:  148mL ISOVUE-370 IOPAMIDOL (ISOVUE-370) INJECTION 76% COMPARISON:  Chest radiograph dated 09/21/2017. FINDINGS: CTA CHEST FINDINGS Cardiovascular: On unenhanced CT, there is no evidence of mediastinal hematoma. Preferential opacification of the thoracic aorta. No evidence of thoracic aortic aneurysm or dissection. Mild  atherosclerotic calcifications of  the aortic arch. Although not tailored for evaluation of the pulmonary arteries, there is no evidence of pulmonary embolism to the segmental level. The heart is normal in size.  No pericardial effusion. Mediastinum/Nodes: No suspicious mediastinal lymphadenopathy. Visualized thyroid is unremarkable. Mild centrilobular emphysematous changes, upper lobe predominant. Lungs/Pleura: Mild dependent atelectasis in the bilateral lower lobes. No focal consolidation. No suspicious pulmonary nodules. No pleural effusion or pneumothorax. Musculoskeletal: Prior vertebral augmentation at T5. Review of the MIP images confirms the above findings. CTA ABDOMEN AND PELVIS FINDINGS VASCULAR Aorta: No evidence of abdominal aortic aneurysm or dissection. Atherosclerotic calcifications. Celiac: Patent. SMA: Patent. Renals: Patent bilaterally, with mild atherosclerotic calcifications at the origin. IMA: Patent. Inflow: Patent, with atherosclerotic calcifications. Veins: Unremarkable. Review of the MIP images confirms the above findings. NON-VASCULAR Hepatobiliary: Liver is grossly unremarkable. Status post cholecystectomy. No intrahepatic or extrahepatic ductal dilatation. Pancreas: Within normal limits. Spleen: Within normal limits. Adrenals/Urinary Tract: Adrenal glands are within normal limits. Left renal cysts, including a dominant 3.2 cm interpolar cyst (series 6/image 149). Suspected nonobstructing right renal calculi measuring 2-3 mm (series 6/image 153). No hydronephrosis. Bladder is within normal limits. Stomach/Bowel: Stomach is within normal limits. No evidence of bowel obstruction. Normal appendix (series 6/image 230). Lymphatic: No suspicious abdominopelvic lymphadenopathy. Reproductive: Status post hysterectomy. Bilateral ovaries are within normal limits. Other: No abdominopelvic ascites. Small fat containing right inguinal hernia. Musculoskeletal: No focal osseous lesions. Review of the MIP  images confirms the above findings. IMPRESSION: No evidence of thoracoabdominal aortic aneurysm or dissection. No evidence of pulmonary embolism. Suspected nonobstructing right renal calculi measuring 2-3 mm. No hydronephrosis. Additional ancillary findings as above. Electronically Signed   By: Julian Hy M.D.   On: 09/21/2017 07:27    Assessment and Plan:   1. 1.  Chest discomfort: Patient presents with episodes of chest discomfort for the past 2 days.  Her troponins remain negative.  EKG shows no ST or T wave changes.  She is a long-term cigarette smoker so is at some increased risk for coronary artery disease.  Fortunately, the troponins remain negative. We will consider getting a stress Myoview once  her heart rate and the atrial fib  is more stable.  2.  Atrial fibrillation with rapid ventricular response:  CHADS2VASC is 10 ( female, age 58).   She was admitted with CP and developed Afib overnight.     We will start her on Cardizem drip.  We will start her on heparin drip for now.  We will wait and start Eliquis once we determine whether or not she will need any additional procedures.   For questions or updates, please contact Center Please consult [email or web address] for contact info under Cardiology/STEMI.   Signed, Mertie Moores, MD  09/22/2017 8:11 AM

## 2017-09-22 NOTE — Progress Notes (Signed)
PROGRESS NOTE    Michelle Gonzales  HYW:[id number] DOB: Nov 21, 1940 DOA: 09/21/2017 PCP: [name], MD   Outpatient Specialists:     Brief Narrative:  Michelle Gonzales is a 77 y.o. female with medical history significant for right breast cancer on tamoxifen, history of tobacco abuse, history of Sjogren's disease not on therapy, sent into the emergency department by EMS, after experiencing substernal chest pain, described as tight, squeezing, radiating throughout her back, without radiation to her arms or neck or jaw. Overnight, patient went into a fib with RVR.     Assessment & Plan:   Principal Problem:   Chest pain Active Problems:   Breast cancer of upper-outer quadrant of right female breast (HCC)   Allergy   COPD (chronic obstructive pulmonary disease) (HCC)   Tobacco abuse   Anxiety   Depression   Leukocytosis  Atrial fibrillation -new onset -echo -TSH -heparin gtt-- change to NOAC when able- per patient no history of GI bleeding -cardizem gtt-- transition to PO meds when able  Chest pain  -CE negative -? Stress test -cardiology consult appreciated  Tobacco abuse with nicotine withdrawal  Nicotine patch Counseled cessation  Elevated Glucose Patient noted to have elevated Glucose in labs at 124 . No prior documented history of DM A1C: 5.7  Leukocytosis -trend  History of breast cancer, on Tamoxifen, no acute issues Continue tamoxifen Follow with Dr. Burr Medico as outpatient   Chronic allergies Continue home meds and nasal sprays      DVT prophylaxis:  Fully anticoagulated   Code Status: Full Code   Family Communication:   Disposition Plan:     Consultants:   cards   Subjective: No history of irregular HR + cough patient attributes to smoking  Objective: Vitals:   09/22/17 0349 09/22/17 0355 09/22/17 0404 09/22/17 0803  BP: (!) 153/89  136/74   Pulse: (!) 114  (!) 109   Resp: (!) 22     Temp:      TempSrc:      SpO2: 93%  94% 95% 97%  Weight:      Height:        Intake/Output Summary (Last 24 hours) at 09/22/2017 1000 Last data filed at 09/22/2017 0900 Gross per 24 hour  Intake 1806.25 ml  Output -  Net 1806.25 ml   Filed Weights   09/21/17 0457 09/21/17 1434 09/22/17 0341  Weight: 72.6 kg (160 lb) 71.8 kg (158 lb 4.8 oz) 73 kg (161 lb)    Examination:  General exam: Appears calm and comfortable  Respiratory system: Clear to auscultation. Respiratory effort normal. Cardiovascular system: irr and fast Gastrointestinal system: Abdomen is nondistended, soft and nontender. No organomegaly or masses felt. Normal bowel sounds heard. Central nervous system: Alert and oriented. No focal neurological deficits. Extremities: Symmetric 5 x 5 power. Skin: No rashes, lesions or ulcers Psychiatry: Judgement and insight appear normal. Mood & affect appropriate.     Data Reviewed: I have personally reviewed following labs and imaging studies  CBC: Recent Labs  Lab 09/21/17 0457 09/22/17 0516  WBC 20.8* 13.1*  NEUTROABS 17.3*  --   HGB 13.9 12.3  HCT 42.0 37.6  MCV 94.0 94.2  PLT 177 485   Basic Metabolic Panel: Recent Labs  Lab 09/21/17 0457 09/22/17 0516  NA 137 133*  K 4.2 4.1  CL 102 104  CO2 25 21*  GLUCOSE 124* 119*  BUN 9 9  CREATININE 0.82 0.69  CALCIUM 8.5* 7.7*   GFR: Estimated Creatinine  Clearance: 55.5 mL/min (by C-G formula based on SCr of 0.69 mg/dL). Liver Function Tests: No results for input(s): AST, ALT, ALKPHOS, BILITOT, PROT, ALBUMIN in the last 168 hours. No results for input(s): LIPASE, AMYLASE in the last 168 hours. No results for input(s): AMMONIA in the last 168 hours. Coagulation Profile: No results for input(s): INR, PROTIME in the last 168 hours. Cardiac Enzymes: Recent Labs  Lab 09/21/17 1001 09/21/17 1505 09/21/17 1841  TROPONINI <0.03 0.04* <0.03   BNP (last 3 results) No results for input(s): PROBNP in the last 8760 hours. HbA1C: Recent Labs     09/21/17 1001  HGBA1C 5.7*   CBG: No results for input(s): GLUCAP in the last 168 hours. Lipid Profile: Recent Labs    09/21/17 1001  CHOL 171  HDL 53  LDLCALC 95  TRIG 117  CHOLHDL 3.2   Thyroid Function Tests: No results for input(s): TSH, T4TOTAL, FREET4, T3FREE, THYROIDAB in the last 72 hours. Anemia Panel: No results for input(s): VITAMINB12, FOLATE, FERRITIN, TIBC, IRON, RETICCTPCT in the last 72 hours. Urine analysis:    Component Value Date/Time   COLORURINE YELLOW 09/21/2017 1020   APPEARANCEUR CLEAR 09/21/2017 1020   LABSPEC >1.046 (H) 09/21/2017 1020   PHURINE 6.0 09/21/2017 1020   GLUCOSEU NEGATIVE 09/21/2017 1020   HGBUR NEGATIVE 09/21/2017 1020   BILIRUBINUR NEGATIVE 09/21/2017 1020   KETONESUR NEGATIVE 09/21/2017 1020   PROTEINUR NEGATIVE 09/21/2017 1020   NITRITE NEGATIVE 09/21/2017 1020   LEUKOCYTESUR NEGATIVE 09/21/2017 1020     )No results found for this or any previous visit (from the past 240 hour(s)).    Anti-infectives (From admission, onward)   None       Radiology Studies: Dg Chest 2 View  Result Date: 09/21/2017 CLINICAL DATA:  77 y/o F; centralized chest pain and shortness of breath. EXAM: CHEST  2 VIEW COMPARISON:  01/27/2013 chest radiograph FINDINGS: Stable normal cardiac silhouette given projection and technique. Reticular opacities of the lungs. No focal consolidation, effusion, or pneumothorax. Stable chronic T5 compression deformity. No acute osseous abnormality identified. Anterior cervical fusion hardware noted. IMPRESSION: Reticular opacities of the lungs, probably bronchitic changes. No consolidation. Stable cardiac silhouette. Electronically Signed   By: Kristine Garbe M.D.   On: 09/21/2017 05:46   Ct Angio Chest/abd/pel For Dissection W And/or Wo Contrast  Result Date: 09/21/2017 CLINICAL DATA:  Chest pain radiating to back EXAM: CT ANGIOGRAPHY CHEST, ABDOMEN AND PELVIS TECHNIQUE: Multidetector CT imaging through the  chest, abdomen and pelvis was performed using the standard protocol during bolus administration of intravenous contrast. Multiplanar reconstructed images and MIPs were obtained and reviewed to evaluate the vascular anatomy. CONTRAST:  18mL ISOVUE-370 IOPAMIDOL (ISOVUE-370) INJECTION 76% COMPARISON:  Chest radiograph dated 09/21/2017. FINDINGS: CTA CHEST FINDINGS Cardiovascular: On unenhanced CT, there is no evidence of mediastinal hematoma. Preferential opacification of the thoracic aorta. No evidence of thoracic aortic aneurysm or dissection. Mild atherosclerotic calcifications of the aortic arch. Although not tailored for evaluation of the pulmonary arteries, there is no evidence of pulmonary embolism to the segmental level. The heart is normal in size.  No pericardial effusion. Mediastinum/Nodes: No suspicious mediastinal lymphadenopathy. Visualized thyroid is unremarkable. Mild centrilobular emphysematous changes, upper lobe predominant. Lungs/Pleura: Mild dependent atelectasis in the bilateral lower lobes. No focal consolidation. No suspicious pulmonary nodules. No pleural effusion or pneumothorax. Musculoskeletal: Prior vertebral augmentation at T5. Review of the MIP images confirms the above findings. CTA ABDOMEN AND PELVIS FINDINGS VASCULAR Aorta: No evidence of abdominal aortic aneurysm or  dissection. Atherosclerotic calcifications. Celiac: Patent. SMA: Patent. Renals: Patent bilaterally, with mild atherosclerotic calcifications at the origin. IMA: Patent. Inflow: Patent, with atherosclerotic calcifications. Veins: Unremarkable. Review of the MIP images confirms the above findings. NON-VASCULAR Hepatobiliary: Liver is grossly unremarkable. Status post cholecystectomy. No intrahepatic or extrahepatic ductal dilatation. Pancreas: Within normal limits. Spleen: Within normal limits. Adrenals/Urinary Tract: Adrenal glands are within normal limits. Left renal cysts, including a dominant 3.2 cm interpolar cyst  (series 6/image 149). Suspected nonobstructing right renal calculi measuring 2-3 mm (series 6/image 153). No hydronephrosis. Bladder is within normal limits. Stomach/Bowel: Stomach is within normal limits. No evidence of bowel obstruction. Normal appendix (series 6/image 230). Lymphatic: No suspicious abdominopelvic lymphadenopathy. Reproductive: Status post hysterectomy. Bilateral ovaries are within normal limits. Other: No abdominopelvic ascites. Small fat containing right inguinal hernia. Musculoskeletal: No focal osseous lesions. Review of the MIP images confirms the above findings. IMPRESSION: No evidence of thoracoabdominal aortic aneurysm or dissection. No evidence of pulmonary embolism. Suspected nonobstructing right renal calculi measuring 2-3 mm. No hydronephrosis. Additional ancillary findings as above. Electronically Signed   By: Julian Hy M.D.   On: 09/21/2017 07:27        Scheduled Meds: . aspirin EC  325 mg Oral Daily  . azelastine  1 spray Each Nare BID  . cholecalciferol  1,000 Units Oral Daily  . heparin  4,000 Units Intravenous Once  . mometasone-formoterol  2 puff Inhalation BID  . montelukast  10 mg Oral QHS  . nicotine  7 mg Transdermal Daily  . tamoxifen  20 mg Oral Daily   Continuous Infusions: . diltiazem (CARDIZEM) infusion 7.5 mg/hr (09/22/17 0907)  . heparin       LOS: 0 days    Time spent: 35 min    Geradine Girt, DO [facility] Pager [phone]  If 7PM-7AM, please contact night-coverage [email or web address] Password [email or web address] 09/22/2017, 10:00 AM

## 2017-09-22 NOTE — Progress Notes (Signed)
Patient ambulated to/from bathroom and toilet voided, on the way back to bed stood on standing scale for morning weight.  Right after standing on scale patient stated she was going to be sick and then had episode of emesis. Right after emesis episode patient went into new onset atrial fibrillation with rapid ventricular response.  Heart rate as high as 200's, patient sitting on side of bed.  RN assisted patient into supine position in bed.  EKG confirmed rhythm change.  Now patient resting in bed continues to be in atrial fibrillation heart rate primarily 100-110's, occasionally into the 120's.  Patient oxygen saturation was 94% on room air, 2L nasal cannula applied.  [facility] paged and Lovey Newcomer, NP was updated of all information stated she would review chart.

## 2017-09-23 ENCOUNTER — Observation Stay (HOSPITAL_COMMUNITY): Payer: Medicare Other

## 2017-09-23 DIAGNOSIS — Z981 Arthrodesis status: Secondary | ICD-10-CM | POA: Diagnosis not present

## 2017-09-23 DIAGNOSIS — R9439 Abnormal result of other cardiovascular function study: Secondary | ICD-10-CM | POA: Diagnosis not present

## 2017-09-23 DIAGNOSIS — Z9049 Acquired absence of other specified parts of digestive tract: Secondary | ICD-10-CM | POA: Diagnosis not present

## 2017-09-23 DIAGNOSIS — R079 Chest pain, unspecified: Secondary | ICD-10-CM | POA: Diagnosis present

## 2017-09-23 DIAGNOSIS — F329 Major depressive disorder, single episode, unspecified: Secondary | ICD-10-CM | POA: Diagnosis present

## 2017-09-23 DIAGNOSIS — I480 Paroxysmal atrial fibrillation: Secondary | ICD-10-CM | POA: Diagnosis not present

## 2017-09-23 DIAGNOSIS — F17213 Nicotine dependence, cigarettes, with withdrawal: Secondary | ICD-10-CM | POA: Diagnosis not present

## 2017-09-23 DIAGNOSIS — I2510 Atherosclerotic heart disease of native coronary artery without angina pectoris: Secondary | ICD-10-CM | POA: Diagnosis not present

## 2017-09-23 DIAGNOSIS — J449 Chronic obstructive pulmonary disease, unspecified: Secondary | ICD-10-CM | POA: Diagnosis not present

## 2017-09-23 DIAGNOSIS — Z720 Tobacco use: Secondary | ICD-10-CM | POA: Diagnosis not present

## 2017-09-23 DIAGNOSIS — I4891 Unspecified atrial fibrillation: Secondary | ICD-10-CM

## 2017-09-23 DIAGNOSIS — F419 Anxiety disorder, unspecified: Secondary | ICD-10-CM | POA: Diagnosis present

## 2017-09-23 DIAGNOSIS — Z880 Allergy status to penicillin: Secondary | ICD-10-CM | POA: Diagnosis not present

## 2017-09-23 DIAGNOSIS — I340 Nonrheumatic mitral (valve) insufficiency: Secondary | ICD-10-CM | POA: Diagnosis present

## 2017-09-23 DIAGNOSIS — Z79810 Long term (current) use of selective estrogen receptor modulators (SERMs): Secondary | ICD-10-CM | POA: Diagnosis not present

## 2017-09-23 DIAGNOSIS — Z882 Allergy status to sulfonamides status: Secondary | ICD-10-CM | POA: Diagnosis not present

## 2017-09-23 DIAGNOSIS — Z90710 Acquired absence of both cervix and uterus: Secondary | ICD-10-CM | POA: Diagnosis not present

## 2017-09-23 DIAGNOSIS — Z7951 Long term (current) use of inhaled steroids: Secondary | ICD-10-CM | POA: Diagnosis not present

## 2017-09-23 DIAGNOSIS — Z853 Personal history of malignant neoplasm of breast: Secondary | ICD-10-CM | POA: Diagnosis not present

## 2017-09-23 DIAGNOSIS — Z885 Allergy status to narcotic agent status: Secondary | ICD-10-CM | POA: Diagnosis not present

## 2017-09-23 DIAGNOSIS — Z803 Family history of malignant neoplasm of breast: Secondary | ICD-10-CM | POA: Diagnosis not present

## 2017-09-23 DIAGNOSIS — R739 Hyperglycemia, unspecified: Secondary | ICD-10-CM | POA: Diagnosis present

## 2017-09-23 DIAGNOSIS — M1990 Unspecified osteoarthritis, unspecified site: Secondary | ICD-10-CM | POA: Diagnosis not present

## 2017-09-23 LAB — NM MYOCAR MULTI W/SPECT W/WALL MOTION / EF
Estimated workload: 1 METS
Exercise duration (min): 4 min
Exercise duration (sec): 37 s
Peak HR: 107 {beats}/min
Rest HR: 82 {beats}/min

## 2017-09-23 LAB — BASIC METABOLIC PANEL
Anion gap: 11 (ref 5–15)
BUN: 9 mg/dL (ref 6–20)
CO2: 23 mmol/L (ref 22–32)
Calcium: 8.1 mg/dL — ABNORMAL LOW (ref 8.9–10.3)
Chloride: 107 mmol/L (ref 101–111)
Creatinine, Ser: 0.75 mg/dL (ref 0.44–1.00)
GFR calc Af Amer: >60 mL/min (ref >60)
GFR calc non Af Amer: >60 mL/min (ref >60)
Glucose, Bld: 102 mg/dL — ABNORMAL HIGH (ref 65–99)
Potassium: 3.7 mmol/L (ref 3.5–5.1)
Sodium: 141 mmol/L (ref 135–145)

## 2017-09-23 LAB — CBC
HCT: 36.9 % (ref 36.0–46.0)
Hemoglobin: 11.9 g/dL — ABNORMAL LOW (ref 12.0–15.0)
MCH: 30.4 pg (ref 26.0–34.0)
MCHC: 32.2 g/dL (ref 30.0–36.0)
MCV: 94.4 fL (ref 78.0–100.0)
Platelets: 156 10*3/uL (ref 150–400)
RBC: 3.91 MIL/uL (ref 3.87–5.11)
RDW: 13.5 % (ref 11.5–15.5)
WBC: 10.9 10*3/uL — ABNORMAL HIGH (ref 4.0–10.5)

## 2017-09-23 LAB — HEPARIN LEVEL (UNFRACTIONATED): Heparin Unfractionated: 0.52 IU/mL (ref 0.30–0.70)

## 2017-09-23 MED ORDER — SODIUM CHLORIDE 0.9 % IV SOLN: 250.0000 mL | INTRAVENOUS | Status: DC | PRN

## 2017-09-23 MED ORDER — TECHNETIUM TC 99M TETROFOSMIN IV KIT
30.0000 | PACK | Freq: Once | INTRAVENOUS | Status: AC | PRN
  Administered 2017-09-23: 30 via INTRAVENOUS

## 2017-09-23 MED ORDER — REGADENOSON 0.4 MG/5ML IV SOLN
0.4000 mg | Freq: Once | INTRAVENOUS | Status: AC
  Administered 2017-09-23: 0.4 mg via INTRAVENOUS
  Filled 2017-09-23: qty 5

## 2017-09-23 MED ORDER — SODIUM CHLORIDE 0.9% FLUSH: 3.0000 mL | INTRAVENOUS | Status: DC | PRN

## 2017-09-23 MED ORDER — ASPIRIN EC 81 MG PO TBEC
81.0000 mg | DELAYED_RELEASE_TABLET | Freq: Every day | ORAL | Status: DC
  Administered 2017-09-25: 81 mg via ORAL
  Filled 2017-09-23: qty 1

## 2017-09-23 MED ORDER — REGADENOSON 0.4 MG/5ML IV SOLN
INTRAVENOUS | Status: AC
  Filled 2017-09-23: qty 5

## 2017-09-23 MED ORDER — ASPIRIN 81 MG PO CHEW
81.0000 mg | CHEWABLE_TABLET | ORAL | Status: AC
  Administered 2017-09-24: 81 mg via ORAL
  Filled 2017-09-23: qty 1

## 2017-09-23 MED ORDER — SODIUM CHLORIDE 0.9 % WEIGHT BASED INFUSION
1.0000 mL/kg/h | INTRAVENOUS | Status: DC
  Administered 2017-09-24: 1 mL/kg/h via INTRAVENOUS

## 2017-09-23 MED ORDER — TECHNETIUM TC 99M TETROFOSMIN IV KIT
10.0000 | PACK | Freq: Once | INTRAVENOUS | Status: AC | PRN
  Administered 2017-09-23: 10 via INTRAVENOUS

## 2017-09-23 MED ORDER — SODIUM CHLORIDE 0.9% FLUSH
3.0000 mL | Freq: Two times a day (BID) | INTRAVENOUS | Status: DC
  Administered 2017-09-23: 3 mL via INTRAVENOUS

## 2017-09-23 MED ORDER — SODIUM CHLORIDE 0.9 % WEIGHT BASED INFUSION
3.0000 mL/kg/h | INTRAVENOUS | Status: DC
  Administered 2017-09-24: 3 mL/kg/h via INTRAVENOUS

## 2017-09-23 NOTE — Care Management Note (Addendum)
Case Management Note  Patient Details  Name: Michelle Gonzales MRN: [id number] Date of Birth: 1940-11-01  Subjective/Objective:   Pt presented for Chest Pain and A fib RVR. PTA Independent from home with support of husband.              Action/Plan: Benefits Check in process for Eliquis. CM will provide pt with 30 day free card and make pt aware of cost once completed. No further needs from CM at this time.   Expected Discharge Date:                  Expected Discharge Plan:  Home/Self Care  In-House Referral:  NA  Discharge planning Services  CM Consult, Medication Assistance  Post Acute Care Choice:  NA Choice offered to:  NA  DME Arranged:  N/A DME Agency:  NA  HH Arranged:  NA HH Agency:  NA  Status of Service:  Completed, signed off  If discussed at [place] of Stay Meetings, dates discussed:    Additional Comments: 1612 09-23-17 Jacqlyn Krauss, RN,BSN [phone] S/W TYE @ [facility] # [phone]    1. ELIQUIS 2.5 MG BID  COVER- YES  CO-PAY- $ 40.00  TIER- 2 DRUG  PRIOR APPROVAL- NO   2. ELIQUIS 5 MG BID  COVER- YES  CO-PAY- $ 40.00  TIER- 2 DRUG  PRIOR APPROVAL- NO   PREFERRED PHARMACY : [facility]  Bethena Roys, RN 09/23/2017, 2:33 PM

## 2017-09-23 NOTE — Progress Notes (Signed)
Patient presented for Lexiscan. Tolerated procedure well. Pending final stress imaging result.  

## 2017-09-23 NOTE — Progress Notes (Signed)
[place] for Heparin Indication: atrial fibrillation  Allergies  Allergen Reactions  . Demerol [Meperidine] Nausea And Vomiting and Other (See Comments)    Bad headache ,  Nausea/Vomiting.  Marland Kitchen Penicillins     Has patient had a PCN reaction causing immediate rash, facial/tongue/throat swelling, SOB or lightheadedness with hypotension: YES Has patient had a PCN reaction causing severe rash involving mucus membranes or skin necrosis: NO Has patient had a PCN reaction that required hospitalization: NO Has patient had a PCN reaction occurring within the last 10 years: NO If all of the above answers are "NO", then may proceed with Cephalosporin use.   . Codeine Other (See Comments)    Massive headache  . Sulfa Antibiotics Itching and Rash    Patient Measurements: Height: 5\' 1"  (154.9 cm) Weight: 158 lb 8 oz (71.9 kg) IBW/kg (Calculated) : 47.8 Heparin Dosing Weight: 64 kg  Vital Signs: Temp: 98.1 F (36.7 C) (03/05 0312) Temp Source: Oral (03/05 0312) BP: 126/82 (03/05 0312) Pulse Rate: 87 (03/05 0312)  Labs: Recent Labs    09/21/17 0457 09/21/17 1001 09/21/17 1505 09/21/17 1841 09/22/17 0516 09/22/17 1801 09/23/17 0340  HGB 13.9  --   --   --  12.3  --  11.9*  HCT 42.0  --   --   --  37.6  --  36.9  PLT 177  --   --   --  156  --  156  HEPARINUNFRC  --   --   --   --   --  0.57 0.52  CREATININE 0.82  --   --   --  0.69  --   --   TROPONINI  --  <0.03 0.04* <0.03  --   --   --     Estimated Creatinine Clearance: 55.1 mL/min (by C-G formula based on SCr of 0.69 mg/dL).   Medical History: Past Medical History:  Diagnosis Date  . Allergy    Pcn, Sulfa, Codeine  . Anxiety   . Arthritis   . Atrial fibrillation ([place]) 09/22/2017  . Cataract    surgey b/l  . COPD (chronic obstructive pulmonary disease) ([address])   . Cough   . Depression    hx years ago  . Hot flashes   . Shortness of breath   . Sjogren's disease [facility])     Assessment:   77 yr old female to begin IV heparin for atrial fibrillation. Admitted 3/3 with chest discomfort. CTA negative for PE or dissection. Troponins negative.Heparin level therapeutic x 2  Goal of Therapy:  Heparin level 0.3-0.7 units/ml Monitor platelets by anticoagulation protocol: Yes   Plan:   Cont heparin at 1000 units/hr Daily CBC/HL  Narda Bonds, PharmD, [place] Pharmacist Phone: [phone]

## 2017-09-23 NOTE — Progress Notes (Addendum)
Stress test read as high risk-- await cardiology recommendations-- continue heparin in case cath is planned  [facility]

## 2017-09-23 NOTE — Progress Notes (Addendum)
Progress Note  Patient Name: Michelle Gonzales Date of Encounter: 09/23/2017  Primary Cardiologist: Stephannie Peters  Subjective   Michelle Gonzales is a 77 y.o. female with a hx of Right breast cancer ( stage 1)  who is being seen today for the evaluation of atrial fib and chest pain  at the request of  Dr. Eliseo Squires.   Patient was admitted with chest pain but actually was incidentally found to have atrial fibrillation.  She was started on IV Cardizem and converted to sinus rhythm overnight.  She has not had any further episodes of chest pain.  Her troponin level yesterday afternoon was 0.04.    Echo shows normal LV function  Trivial AI  Trivial MR   Inpatient Medications    Scheduled Meds: . aspirin EC  325 mg Oral Daily  . azelastine  1 spray Each Nare BID  . cholecalciferol  1,000 Units Oral Daily  . mometasone-formoterol  2 puff Inhalation BID  . montelukast  10 mg Oral QHS  . nicotine  7 mg Transdermal Daily  . tamoxifen  20 mg Oral Daily   Continuous Infusions: . diltiazem (CARDIZEM) infusion 7.5 mg/hr (09/23/17 0311)  . heparin 1,000 Units/hr (09/23/17 0723)   PRN Meds: acetaminophen, ALPRAZolam, gi cocktail, hydrALAZINE, ketorolac, morphine injection, ondansetron (ZOFRAN) IV, oxyCODONE-acetaminophen   Vital Signs    Vitals:   09/22/17 1330 09/22/17 2124 09/23/17 0312 09/23/17 0316  BP: 114/71 113/73 126/82   Pulse: 86 99 87   Resp:  20 18   Temp: 97.6 F (36.4 C) 99.5 F (37.5 C) 98.1 F (36.7 C)   TempSrc: Oral Oral Oral   SpO2: 98% 97% 95%   Weight:    158 lb 8 oz (71.9 kg)  Height:        Intake/Output Summary (Last 24 hours) at 09/23/2017 0754 Last data filed at 09/23/2017 0316 Gross per 24 hour  Intake 1081.63 ml  Output 2850 ml  Net -1768.37 ml   Filed Weights   09/21/17 1434 09/22/17 0341 09/23/17 0316  Weight: 158 lb 4.8 oz (71.8 kg) 161 lb (73 kg) 158 lb 8 oz (71.9 kg)    Telemetry    NSR  - Personally Reviewed  ECG      - Personally  Reviewed  Physical Exam   GEN: No acute distress.   Neck: No JVD Cardiac: RRR, no murmurs, rubs, or gallops.  Respiratory: Clear to auscultation bilaterally. GI: Soft, nontender, non-distended  MS: No edema; No deformity. Neuro:  Nonfocal  Psych: Normal affect   Labs    Chemistry Recent Labs  Lab 09/21/17 0457 09/22/17 0516 09/23/17 0340  NA 137 133* 141  K 4.2 4.1 3.7  CL 102 104 107  CO2 25 21* 23  GLUCOSE 124* 119* 102*  BUN 9 9 9   CREATININE 0.82 0.69 0.75  CALCIUM 8.5* 7.7* 8.1*  GFRNONAA >60 >60 >60  GFRAA >60 >60 >60  ANIONGAP 10 8 11      Hematology Recent Labs  Lab 09/21/17 0457 09/22/17 0516 09/23/17 0340  WBC 20.8* 13.1* 10.9*  RBC 4.47 3.99 3.91  HGB 13.9 12.3 11.9*  HCT 42.0 37.6 36.9  MCV 94.0 94.2 94.4  MCH 31.1 30.8 30.4  MCHC 33.1 32.7 32.2  RDW 13.2 13.3 13.5  PLT 177 156 156    Cardiac Enzymes Recent Labs  Lab 09/21/17 1001 09/21/17 1505 09/21/17 1841  TROPONINI <0.03 0.04* <0.03    Recent Labs  Lab 09/21/17 0505 09/21/17 0350  TROPIPOC 0.01 0.00     BNPNo results for input(s): BNP, PROBNP in the last 168 hours.   DDimer No results for input(s): DDIMER in the last 168 hours.   Radiology    No results found.  Cardiac Studies     Patient Profile     77 y.o. female admitted with atrial fibrillation and chest discomfort.  Assessment & Plan    1.  Chest discomfort: The patient presented with midsternal chest pain with radiation through to the back.  Troponin level was 0.04 yesterday afternoon.  The other troponin levels have been negative.  We will schedule her for a [place] study today. Echo shows normal LV function.  Trivial MR and trivial AI   2.  Atrial fibrillation:   CHADS2VASC = 3 ( female, age 69) The patient inverted to sinus rhythm on IV Cardizem.  She is been on IV heparin.  Will start her on Eliquis when we are certain that she does not need any further procedures.  For questions or updates,  please contact [place] Please consult [email or web address] for contact info under Cardiology/STEMI.      Signed, Mertie Moores, MD  09/23/2017, 7:54 AM

## 2017-09-23 NOTE — H&P (View-Only) (Signed)
Progress Note  Patient Name: Michelle Gonzales Date of Encounter: 09/23/2017  Primary Cardiologist: Stephannie Peters  Subjective   Michelle Gonzales is a 77 y.o. female with a hx of Right breast cancer ( stage 1)  who is being seen today for the evaluation of atrial fib and chest pain  at the request of  Dr. Eliseo Squires.   Patient was admitted with chest pain but actually was incidentally found to have atrial fibrillation.  She was started on IV Cardizem and converted to sinus rhythm overnight.  She has not had any further episodes of chest pain.  Her troponin level yesterday afternoon was 0.04.    Echo shows normal LV function  Trivial AI  Trivial MR   Inpatient Medications    Scheduled Meds: . aspirin EC  325 mg Oral Daily  . azelastine  1 spray Each Nare BID  . cholecalciferol  1,000 Units Oral Daily  . mometasone-formoterol  2 puff Inhalation BID  . montelukast  10 mg Oral QHS  . nicotine  7 mg Transdermal Daily  . tamoxifen  20 mg Oral Daily   Continuous Infusions: . diltiazem (CARDIZEM) infusion 7.5 mg/hr (09/23/17 0311)  . heparin 1,000 Units/hr (09/23/17 0723)   PRN Meds: acetaminophen, ALPRAZolam, gi cocktail, hydrALAZINE, ketorolac, morphine injection, ondansetron (ZOFRAN) IV, oxyCODONE-acetaminophen   Vital Signs    Vitals:   09/22/17 1330 09/22/17 2124 09/23/17 0312 09/23/17 0316  BP: 114/71 113/73 126/82   Pulse: 86 99 87   Resp:  20 18   Temp: 97.6 F (36.4 C) 99.5 F (37.5 C) 98.1 F (36.7 C)   TempSrc: Oral Oral Oral   SpO2: 98% 97% 95%   Weight:    158 lb 8 oz (71.9 kg)  Height:        Intake/Output Summary (Last 24 hours) at 09/23/2017 0754 Last data filed at 09/23/2017 0316 Gross per 24 hour  Intake 1081.63 ml  Output 2850 ml  Net -1768.37 ml   Filed Weights   09/21/17 1434 09/22/17 0341 09/23/17 0316  Weight: 158 lb 4.8 oz (71.8 kg) 161 lb (73 kg) 158 lb 8 oz (71.9 kg)    Telemetry    NSR  - Personally Reviewed  ECG      - Personally  Reviewed  Physical Exam   GEN: No acute distress.   Neck: No JVD Cardiac: RRR, no murmurs, rubs, or gallops.  Respiratory: Clear to auscultation bilaterally. GI: Soft, nontender, non-distended  MS: No edema; No deformity. Neuro:  Nonfocal  Psych: Normal affect   Labs    Chemistry Recent Labs  Lab 09/21/17 0457 09/22/17 0516 09/23/17 0340  NA 137 133* 141  K 4.2 4.1 3.7  CL 102 104 107  CO2 25 21* 23  GLUCOSE 124* 119* 102*  BUN 9 9 9   CREATININE 0.82 0.69 0.75  CALCIUM 8.5* 7.7* 8.1*  GFRNONAA >60 >60 >60  GFRAA >60 >60 >60  ANIONGAP 10 8 11      Hematology Recent Labs  Lab 09/21/17 0457 09/22/17 0516 09/23/17 0340  WBC 20.8* 13.1* 10.9*  RBC 4.47 3.99 3.91  HGB 13.9 12.3 11.9*  HCT 42.0 37.6 36.9  MCV 94.0 94.2 94.4  MCH 31.1 30.8 30.4  MCHC 33.1 32.7 32.2  RDW 13.2 13.3 13.5  PLT 177 156 156    Cardiac Enzymes Recent Labs  Lab 09/21/17 1001 09/21/17 1505 09/21/17 1841  TROPONINI <0.03 0.04* <0.03    Recent Labs  Lab 09/21/17 0505 09/21/17 4098  TROPIPOC 0.01 0.00     BNPNo results for input(s): BNP, PROBNP in the last 168 hours.   DDimer No results for input(s): DDIMER in the last 168 hours.   Radiology    No results found.  Cardiac Studies     Patient Profile     77 y.o. female admitted with atrial fibrillation and chest discomfort.  Assessment & Plan    1.  Chest discomfort: The patient presented with midsternal chest pain with radiation through to the back.  Troponin level was 0.04 yesterday afternoon.  The other troponin levels have been negative.  We will schedule her for a [place] study today. Echo shows normal LV function.  Trivial MR and trivial AI   2.  Atrial fibrillation:   CHADS2VASC = 3 ( female, age 76) The patient inverted to sinus rhythm on IV Cardizem.  She is been on IV heparin.  Will start her on Eliquis when we are certain that she does not need any further procedures.  For questions or updates,  please contact [place] Please consult [email or web address] for contact info under Cardiology/STEMI.      Signed, Mertie Moores, MD  09/23/2017, 7:54 AM

## 2017-09-23 NOTE — Progress Notes (Addendum)
Stress test abnormal as below:  There was no ST segment deviation noted during stress.  No T wave inversion was noted during stress.  Defect 1: There is a large defect of mild severity present in the basal inferior, basal inferolateral, mid inferior, mid inferolateral, apical septal, apical inferior and apical lateral location.  Findings consistent with ischemia.  This is a high risk study.  The left ventricular ejection fraction is mildly decreased (45-54%).   The study is affected by artifacts however there appears to be an ischemia in the basal and mid inferior and inferolateral walls and in the septal, inferior and lateral walls.  Overall LVEF is decreased calculated at 51%.   Plan for [facility] tomorrow. The patient understands that risks include but are not limited to stroke (1 in 1000), death (1 in 2), kidney failure [usually temporary] (1 in 500), bleeding (1 in 200), allergic reaction [possibly serious] (1 in 200), and agrees to proceed.

## 2017-09-23 NOTE — Progress Notes (Signed)
CCMD alerted that patient converted to NSR at 0521. EKG confirmed this conversion; strip placed in chart. IV cardizem still infusing. Will continue to monitor closely. Jacqlyn Larsen, RN

## 2017-09-24 ENCOUNTER — Encounter (HOSPITAL_COMMUNITY): Payer: Self-pay | Admitting: Cardiology

## 2017-09-24 ENCOUNTER — Encounter (HOSPITAL_COMMUNITY)
Admission: EM | Disposition: A | Discharge status: Home / Self Care | Payer: Self-pay | Source: Home / Self Care | Attending: Internal Medicine

## 2017-09-24 DIAGNOSIS — R9439 Abnormal result of other cardiovascular function study: Secondary | ICD-10-CM

## 2017-09-24 HISTORY — PX: LEFT HEART CATH AND CORONARY ANGIOGRAPHY: CATH118249

## 2017-09-24 HISTORY — PX: ULTRASOUND GUIDANCE FOR VASCULAR ACCESS: SHX6516

## 2017-09-24 LAB — CBC
HCT: 40.9 % (ref 36.0–46.0)
Hemoglobin: 13.4 g/dL (ref 12.0–15.0)
MCH: 30.9 pg (ref 26.0–34.0)
MCHC: 32.8 g/dL (ref 30.0–36.0)
MCV: 94.2 fL (ref 78.0–100.0)
Platelets: 212 10*3/uL (ref 150–400)
RBC: 4.34 MIL/uL (ref 3.87–5.11)
RDW: 13.3 % (ref 11.5–15.5)
WBC: 12.3 10*3/uL — ABNORMAL HIGH (ref 4.0–10.5)

## 2017-09-24 LAB — PROTIME-INR
INR: 1.11
Prothrombin Time: 14.2 seconds (ref 11.4–15.2)

## 2017-09-24 LAB — HEPARIN LEVEL (UNFRACTIONATED): Heparin Unfractionated: 0.73 IU/mL — ABNORMAL HIGH (ref 0.30–0.70)

## 2017-09-24 SURGERY — LEFT HEART CATH AND CORONARY ANGIOGRAPHY
Anesthesia: LOCAL | Laterality: Right

## 2017-09-24 MED ORDER — SODIUM CHLORIDE 0.9 % IV SOLN: INTRAVENOUS | Status: AC

## 2017-09-24 MED ORDER — LIDOCAINE HCL (PF) 1 % IJ SOLN
INTRAMUSCULAR | Status: DC | PRN
  Administered 2017-09-24: 2 mL

## 2017-09-24 MED ORDER — MIDAZOLAM HCL 2 MG/2ML IJ SOLN
INTRAMUSCULAR | Status: DC | PRN
  Administered 2017-09-24: 1 mg via INTRAVENOUS

## 2017-09-24 MED ORDER — HEPARIN (PORCINE) IN NACL 2-0.9 UNIT/ML-% IJ SOLN
INTRAMUSCULAR | Status: AC | PRN
  Administered 2017-09-24: 500 mL

## 2017-09-24 MED ORDER — SODIUM CHLORIDE 0.9% FLUSH: 3.0000 mL | INTRAVENOUS | Status: DC | PRN

## 2017-09-24 MED ORDER — APIXABAN 5 MG PO TABS
5.0000 mg | ORAL_TABLET | Freq: Two times a day (BID) | ORAL | Status: DC
  Administered 2017-09-24 – 2017-09-25 (×2): 5 mg via ORAL
  Filled 2017-09-24 (×2): qty 1

## 2017-09-24 MED ORDER — BISACODYL 5 MG PO TBEC
5.0000 mg | DELAYED_RELEASE_TABLET | Freq: Every day | ORAL | Status: DC | PRN
  Administered 2017-09-25: 5 mg via ORAL
  Filled 2017-09-24: qty 1

## 2017-09-24 MED ORDER — METOPROLOL TARTRATE 25 MG PO TABS
25.0000 mg | ORAL_TABLET | Freq: Two times a day (BID) | ORAL | Status: DC
  Administered 2017-09-24 – 2017-09-25 (×3): 25 mg via ORAL
  Filled 2017-09-24 (×3): qty 1

## 2017-09-24 MED ORDER — DILTIAZEM HCL-DEXTROSE 100-5 MG/100ML-% IV SOLN (PREMIX)
5.0000 mg/h | INTRAVENOUS | Status: DC
  Administered 2017-09-24: 5 mg/h via INTRAVENOUS
  Filled 2017-09-24 (×3): qty 100

## 2017-09-24 MED ORDER — HEPARIN (PORCINE) IN NACL 2-0.9 UNIT/ML-% IJ SOLN
INTRAMUSCULAR | Status: AC
  Filled 2017-09-24: qty 1000

## 2017-09-24 MED ORDER — HEPARIN SODIUM (PORCINE) 1000 UNIT/ML IJ SOLN
INTRAMUSCULAR | Status: AC
  Filled 2017-09-24: qty 1

## 2017-09-24 MED ORDER — SODIUM CHLORIDE 0.9 % IV SOLN: 250.0000 mL | INTRAVENOUS | Status: DC | PRN

## 2017-09-24 MED ORDER — MIDAZOLAM HCL 2 MG/2ML IJ SOLN
INTRAMUSCULAR | Status: AC
  Filled 2017-09-24: qty 2

## 2017-09-24 MED ORDER — IOPAMIDOL (ISOVUE-370) INJECTION 76%
INTRAVENOUS | Status: AC
  Filled 2017-09-24: qty 100

## 2017-09-24 MED ORDER — VERAPAMIL HCL 2.5 MG/ML IV SOLN
INTRAVENOUS | Status: DC | PRN
  Administered 2017-09-24: 11:00:00 via INTRA_ARTERIAL

## 2017-09-24 MED ORDER — HEPARIN SODIUM (PORCINE) 1000 UNIT/ML IJ SOLN
INTRAMUSCULAR | Status: DC | PRN
  Administered 2017-09-24: 3500 [IU] via INTRAVENOUS

## 2017-09-24 MED ORDER — VERAPAMIL HCL 2.5 MG/ML IV SOLN
INTRAVENOUS | Status: AC
  Filled 2017-09-24: qty 2

## 2017-09-24 MED ORDER — LIDOCAINE HCL (PF) 1 % IJ SOLN
INTRAMUSCULAR | Status: AC
  Filled 2017-09-24: qty 30

## 2017-09-24 MED ORDER — SODIUM CHLORIDE 0.9% FLUSH
3.0000 mL | Freq: Two times a day (BID) | INTRAVENOUS | Status: DC
  Administered 2017-09-24: 3 mL via INTRAVENOUS

## 2017-09-24 MED ORDER — IOPAMIDOL (ISOVUE-370) INJECTION 76%
INTRAVENOUS | Status: DC | PRN
  Administered 2017-09-24: 65 mL via INTRA_ARTERIAL

## 2017-09-24 SURGICAL SUPPLY — 11 items
CATH INFINITI 5FR ANG PIGTAIL (CATHETERS) ×1 IMPLANT
COVER PRB 48X5XTLSCP FOLD TPE (BAG) IMPLANT
COVER PROBE 5X48 (BAG) ×3
DEVICE RAD TR BAND REGULAR (VASCULAR PRODUCTS) ×1 IMPLANT
GLIDESHEATH SLEND A-KIT 6F 22G (SHEATH) ×1 IMPLANT
GUIDEWIRE INQWIRE 1.5J.035X260 (WIRE) IMPLANT
INQWIRE 1.5J .035X260CM (WIRE) ×3
KIT HEART LEFT (KITS) ×3 IMPLANT
PACK CARDIAC CATHETERIZATION (CUSTOM PROCEDURE TRAY) ×3 IMPLANT
TRANSDUCER W/STOPCOCK (MISCELLANEOUS) ×3 IMPLANT
TUBING CIL FLEX 10 FLL-RA (TUBING) ×3 IMPLANT

## 2017-09-24 NOTE — Progress Notes (Signed)
[place] for Heparin Indication: atrial fibrillation  Allergies  Allergen Reactions  . Demerol [Meperidine] Nausea And Vomiting and Other (See Comments)    Bad headache ,  Nausea/Vomiting.  Marland Kitchen Penicillins     Has patient had a PCN reaction causing immediate rash, facial/tongue/throat swelling, SOB or lightheadedness with hypotension: YES Has patient had a PCN reaction causing severe rash involving mucus membranes or skin necrosis: NO Has patient had a PCN reaction that required hospitalization: NO Has patient had a PCN reaction occurring within the last 10 years: NO If all of the above answers are "NO", then may proceed with Cephalosporin use.   . Codeine Other (See Comments)    Massive headache  . Sulfa Antibiotics Itching and Rash    Patient Measurements: Height: 5\' 1"  (154.9 cm) Weight: 154 lb 8 oz (70.1 kg) IBW/kg (Calculated) : 47.8 Heparin Dosing Weight: 64 kg  Vital Signs: Temp: 97.8 F (36.6 C) (03/06 0501) Temp Source: Oral (03/06 0501) BP: 153/78 (03/06 0501) Pulse Rate: 108 (03/06 0501)  Labs: Recent Labs    09/21/17 1001 09/21/17 1505 09/21/17 1841  09/22/17 0516 09/22/17 1801 09/23/17 0340 09/24/17 0548  HGB  --   --   --    < > 12.3  --  11.9* 13.4  HCT  --   --   --   --  37.6  --  36.9 40.9  PLT  --   --   --   --  156  --  156 212  LABPROT  --   --   --   --   --   --   --  14.2  INR  --   --   --   --   --   --   --  1.11  HEPARINUNFRC  --   --   --   --   --  0.57 0.52 0.73*  CREATININE  --   --   --   --  0.69  --  0.75  --   TROPONINI <0.03 0.04* <0.03  --   --   --   --   --    < > = values in this interval not displayed.    Estimated Creatinine Clearance: 54.4 mL/min (by C-G formula based on SCr of 0.75 mg/dL).   Medical History: Past Medical History:  Diagnosis Date  . Allergy    Pcn, Sulfa, Codeine  . Anxiety   . Arthritis   . Atrial fibrillation ([place]) 09/22/2017  . Cataract    surgey b/l   . COPD (chronic obstructive pulmonary disease) ([place])   . Cough   . Depression    hx years ago  . Hot flashes   . Shortness of breath   . Sjogren's disease [facility])    Assessment: 77 yr old female to begin IV heparin for atrial fibrillation. Admitted 3/3 with chest discomfort. CTA negative for PE or dissection. Troponins negative. Heparin level slightly supratherapeutic. CBC wnl. No bleed reported  Goal of Therapy:  Heparin level 0.3-0.7 units/ml Monitor platelets by anticoagulation protocol: Yes   Plan:   Reduce heparin to 950 units/hr Daily CBC/heparin level Cath scheduled for 3/6, to start apixaban when procedures complete  Elicia Lamp, PharmD, BCPS Clinical Pharmacist Clinical phone for 09/24/2017 until 3:30pm: [phone] If after 3:30pm, please call main pharmacy at: [phone] 09/24/2017 8:24 AM

## 2017-09-24 NOTE — Progress Notes (Signed)
PROGRESS NOTE    Michelle Gonzales  JXB:[id number] DOB: 02-22-1941 DOA: 09/21/2017 PCP: [name], MD   Outpatient Specialists:     Brief Narrative:  Michelle Gonzales is a 77 y.o. female with medical history significant for right breast cancer on tamoxifen, history of tobacco abuse, history of Sjogren's disease not on therapy, sent into the emergency department by EMS, after experiencing substernal chest pain, described as tight, squeezing, radiating throughout her back, without radiation to her arms or neck or jaw.  Patient went into a fib with RVR, convert back to sinus and then re-converted to a fib.  Being started on metoprolol and eliquis.  S/p LHC with minimal CAD.  Suspect can go home in the AM if HR controlled.     Assessment & Plan:   Principal Problem:   Chest pain Active Problems:   Breast cancer of upper-outer quadrant of right female breast (HCC)   Allergy   COPD (chronic obstructive pulmonary disease) (HCC)   Tobacco abuse   Anxiety   Depression   Leukocytosis   Atrial fibrillation with RVR (HCC)   Abnormal cardiovascular stress test  Atrial fibrillation ([place] VASC- 3) -new onset -echo: normal EF, trivial pericardial effusion -TSH normal -heparin gtt-- change to NOAC -- eliquis -cardizem gtt-- transition to metoprolol  Chest pain  -CE flat (suspect 1 elevation related to a fib) -?high risk stress test -cath: min CAD  Tobacco abuse with nicotine withdrawal  Nicotine patch Counseled cessation  Elevated Glucose Patient noted to have elevated Glucose in labs at 124 . No prior documented history of DM A1C: 5.7  Leukocytosis -trend  History of breast cancer, on Tamoxifen, no acute issues Continue tamoxifen Follow with Dr. Burr Medico as outpatient   Chronic allergies Continue home meds and nasal sprays      DVT prophylaxis:  Fully anticoagulated   Code Status: Full Code   Family Communication:   Disposition Plan:  Home in  AM?   Consultants:   cards   Subjective: Heading to heart cath this AM  Objective: Vitals:   09/24/17 1104 09/24/17 1109 09/24/17 1114 09/24/17 1334  BP: 137/73 (!) 135/92 137/77 116/69  Pulse: 90 91 100 (!) 20  Resp: 20 (!) 21 (!) 23   Temp:    99.7 F (37.6 C)  TempSrc:    Oral  SpO2: 92% 93% (!) 0% 95%  Weight:      Height:        Intake/Output Summary (Last 24 hours) at 09/24/2017 1433 Last data filed at 09/24/2017 0539 Gross per 24 hour  Intake 480 ml  Output 3 ml  Net 477 ml   Filed Weights   09/22/17 0341 09/23/17 0316 09/24/17 0501  Weight: 73 kg (161 lb) 71.9 kg (158 lb 8 oz) 70.1 kg (154 lb 8 oz)    Examination:  General exam: in bed, NAD Respiratory system: no increase work of breathing, no wheezing Cardiovascular system: irr Gastrointestinal system: +BS, soft Central nervous system: alert    Data Reviewed: I have personally reviewed following labs and imaging studies  CBC: Recent Labs  Lab 09/21/17 0457 09/22/17 0516 09/23/17 0340 09/24/17 0548  WBC 20.8* 13.1* 10.9* 12.3*  NEUTROABS 17.3*  --   --   --   HGB 13.9 12.3 11.9* 13.4  HCT 42.0 37.6 36.9 40.9  MCV 94.0 94.2 94.4 94.2  PLT 177 156 156 130   Basic Metabolic Panel: Recent Labs  Lab 09/21/17 0457 09/22/17 0516 09/23/17 0340  NA  137 133* 141  K 4.2 4.1 3.7  CL 102 104 107  CO2 25 21* 23  GLUCOSE 124* 119* 102*  BUN 9 9 9   CREATININE 0.82 0.69 0.75  CALCIUM 8.5* 7.7* 8.1*   GFR: Estimated Creatinine Clearance: 54.4 mL/min (by C-G formula based on SCr of 0.75 mg/dL). Liver Function Tests: No results for input(s): AST, ALT, ALKPHOS, BILITOT, PROT, ALBUMIN in the last 168 hours. No results for input(s): LIPASE, AMYLASE in the last 168 hours. No results for input(s): AMMONIA in the last 168 hours. Coagulation Profile: Recent Labs  Lab 09/24/17 0548  INR 1.11   Cardiac Enzymes: Recent Labs  Lab 09/21/17 1001 09/21/17 1505 09/21/17 1841  TROPONINI <0.03 0.04*  <0.03   BNP (last 3 results) No results for input(s): PROBNP in the last 8760 hours. HbA1C: No results for input(s): HGBA1C in the last 72 hours. CBG: No results for input(s): GLUCAP in the last 168 hours. Lipid Profile: No results for input(s): CHOL, HDL, LDLCALC, TRIG, CHOLHDL, LDLDIRECT in the last 72 hours. Thyroid Function Tests: Recent Labs    09/22/17 1040  TSH 1.202   Anemia Panel: No results for input(s): VITAMINB12, FOLATE, FERRITIN, TIBC, IRON, RETICCTPCT in the last 72 hours. Urine analysis:    Component Value Date/Time   COLORURINE YELLOW 09/21/2017 1020   APPEARANCEUR CLEAR 09/21/2017 1020   LABSPEC >1.046 (H) 09/21/2017 1020   PHURINE 6.0 09/21/2017 1020   GLUCOSEU NEGATIVE 09/21/2017 1020   HGBUR NEGATIVE 09/21/2017 1020   BILIRUBINUR NEGATIVE 09/21/2017 1020   KETONESUR NEGATIVE 09/21/2017 1020   PROTEINUR NEGATIVE 09/21/2017 1020   NITRITE NEGATIVE 09/21/2017 1020   LEUKOCYTESUR NEGATIVE 09/21/2017 1020     )No results found for this or any previous visit (from the past 240 hour(s)).    Anti-infectives (From admission, onward)   None       Radiology Studies: Nm Myocar Multi W/spect W/wall Motion / Ef  Result Date: 09/23/2017  There was no ST segment deviation noted during stress.  No T wave inversion was noted during stress.  Defect 1: There is a large defect of mild severity present in the basal inferior, basal inferolateral, mid inferior, mid inferolateral, apical septal, apical inferior and apical lateral location.  Findings consistent with ischemia.  This is a high risk study.  The left ventricular ejection fraction is mildly decreased (45-54%).  The study is affected by artifacts however there appears to be an ischemia in the basal and mid inferior and inferolateral walls and in the septal, inferior and lateral walls. Overall LVEF is decreased calculated at 51%.        Scheduled Meds: . apixaban  5 mg Oral BID  . aspirin EC  81 mg  Oral Daily  . azelastine  1 spray Each Nare BID  . cholecalciferol  1,000 Units Oral Daily  . metoprolol tartrate  25 mg Oral BID  . mometasone-formoterol  2 puff Inhalation BID  . montelukast  10 mg Oral QHS  . nicotine  7 mg Transdermal Daily  . sodium chloride flush  3 mL Intravenous Q12H  . tamoxifen  20 mg Oral Daily   Continuous Infusions: . sodium chloride    . sodium chloride    . diltiazem (CARDIZEM) infusion 7.5 mg/hr (09/24/17 0656)     LOS: 1 day    Time spent: 25 min    Geradine Girt, DO [facility] Pager [phone]  If 7PM-7AM, please contact night-coverage [email or web address] Password [facility] 09/24/2017, 2:33 PM

## 2017-09-24 NOTE — Progress Notes (Signed)
Pt note to have converted back to A. Fib with a HR around 95-99. CCMD called and said that pt converted about 0420. EKG obtained confirmed A. Fib RVR. On exertion, pt HR reached 150's.  Dr. Raiford Simmonds with Cardiology notified. Page returned at [phone] with verbal order to restart diltiazem gtt. Will follow order as given and continue to monitor closely.  Jacqlyn Larsen, RN

## 2017-09-24 NOTE — Discharge Instructions (Addendum)

## 2017-09-24 NOTE — Progress Notes (Signed)
Progress Note  Patient Name: Michelle Gonzales Date of Encounter: 09/24/2017  Primary Cardiologist: Stephannie Peters  Subjective   Michelle Gonzales is a 77 y.o. female with a hx of Right breast cancer ( stage 1)  who is being seen today for the evaluation of atrial fib and chest pain  at the request of  Dr. Eliseo Squires.   Patient was admitted with chest pain but actually was incidentally found to have atrial fibrillation.  She was started on IV Cardizem and converted to sinus rhythm overnight.  She has not had any further episodes of chest pain.  Her troponin level yesterday afternoon was 0.04.    Echo shows normal LV function  Trivial AI  Trivial MR   myoview was abnormal Cath today revealed mild CAD .  No significant disease  She has gone back into NSR   Inpatient Medications    Scheduled Meds: . apixaban  5 mg Oral BID  . aspirin EC  81 mg Oral Daily  . azelastine  1 spray Each Nare BID  . cholecalciferol  1,000 Units Oral Daily  . mometasone-formoterol  2 puff Inhalation BID  . montelukast  10 mg Oral QHS  . nicotine  7 mg Transdermal Daily  . sodium chloride flush  3 mL Intravenous Q12H  . tamoxifen  20 mg Oral Daily   Continuous Infusions: . sodium chloride    . sodium chloride    . diltiazem (CARDIZEM) infusion 7.5 mg/hr (09/24/17 0656)   PRN Meds: sodium chloride, acetaminophen, ALPRAZolam, gi cocktail, hydrALAZINE, ketorolac, morphine injection, ondansetron (ZOFRAN) IV, oxyCODONE-acetaminophen, sodium chloride flush   Vital Signs    Vitals:   09/24/17 1059 09/24/17 1104 09/24/17 1109 09/24/17 1114  BP: 139/77 137/73 (!) 135/92 137/77  Pulse: 81 90 91 100  Resp: (!) 22 20 (!) 21 (!) 23  Temp:      TempSrc:      SpO2: 93% 92% 93% (!) 0%  Weight:      Height:        Intake/Output Summary (Last 24 hours) at 09/24/2017 1143 Last data filed at 09/24/2017 0539 Gross per 24 hour  Intake 720 ml  Output 3 ml  Net 717 ml   Filed Weights   09/22/17 0341 09/23/17 0316  09/24/17 0501  Weight: 161 lb (73 kg) 158 lb 8 oz (71.9 kg) 154 lb 8 oz (70.1 kg)    Telemetry    Atrial fib with RVR  - Personally Reviewed  ECG      - Personally Reviewed  Physical Exam   GEN: No acute distress.   Neck: No JVD Cardiac:  Irreg. Irreg.   Respiratory: Clear to auscultation bilaterally. GI: Soft, nontender, non-distended  MS: TR band on R wrist. Neuro:  Nonfocal  Psych: Normal affect   Labs    Chemistry Recent Labs  Lab 09/21/17 0457 09/22/17 0516 09/23/17 0340  NA 137 133* 141  K 4.2 4.1 3.7  CL 102 104 107  CO2 25 21* 23  GLUCOSE 124* 119* 102*  BUN 9 9 9   CREATININE 0.82 0.69 0.75  CALCIUM 8.5* 7.7* 8.1*  GFRNONAA >60 >60 >60  GFRAA >60 >60 >60  ANIONGAP 10 8 11      Hematology Recent Labs  Lab 09/22/17 0516 09/23/17 0340 09/24/17 0548  WBC 13.1* 10.9* 12.3*  RBC 3.99 3.91 4.34  HGB 12.3 11.9* 13.4  HCT 37.6 36.9 40.9  MCV 94.2 94.4 94.2  MCH 30.8 30.4 30.9  MCHC 32.7  32.2 32.8  RDW 13.3 13.5 13.3  PLT 156 156 212    Cardiac Enzymes Recent Labs  Lab 09/21/17 1001 09/21/17 1505 09/21/17 1841  TROPONINI <0.03 0.04* <0.03    Recent Labs  Lab 09/21/17 0505 09/21/17 0819  TROPIPOC 0.01 0.00     BNPNo results for input(s): BNP, PROBNP in the last 168 hours.   DDimer No results for input(s): DDIMER in the last 168 hours.   Radiology    Nm Myocar Multi W/spect W/wall Motion / Ef  Result Date: 09/23/2017  There was no ST segment deviation noted during stress.  No T wave inversion was noted during stress.  Defect 1: There is a large defect of mild severity present in the basal inferior, basal inferolateral, mid inferior, mid inferolateral, apical septal, apical inferior and apical lateral location.  Findings consistent with ischemia.  This is a high risk study.  The left ventricular ejection fraction is mildly decreased (45-54%).  The study is affected by artifacts however there appears to be an ischemia in the basal and  mid inferior and inferolateral walls and in the septal, inferior and lateral walls. Overall LVEF is decreased calculated at 51%.    Cardiac Studies     Patient Profile     77 y.o. female admitted with atrial fibrillation and chest discomfort.  Assessment & Plan    1.  Chest discomfort: cath shows mild CAD. No further work up needed at this pint    2.  Atrial fibrillation:   CHADS2VASC = 3 ( female, age 75)   Will start Eliquis tonight  Back on Dilt drip  Will add metoprolol 25  Bid,  Titrate up as tolerated, / as needed.   I suspect she could go home tomorrow if we can get her HR well controlled n PO meds.   For questions or updates, please contact [place] Please consult [email or web address] for contact info under Cardiology/STEMI.      Signed, Mertie Moores, MD  09/24/2017, 11:43 AM

## 2017-09-24 NOTE — Interval H&P Note (Signed)
History and Physical Interval Note:  09/24/2017 9:59 AM  Michelle Gonzales  has presented today for surgery, with the diagnosis of abnormal myoview, Myoview was read after the last progress note.  Revealed: Stress test abnormal as below:  There was no ST segment deviation noted during stress.  No T wave inversion was noted during stress.  Defect 1: There is a large defect of mild severity present in the basal inferior, basal inferolateral, mid inferior, mid inferolateral, apical septal, apical inferior and apical lateral location.  Findings consistent with ischemia.  This is a high risk study.  The left ventricular ejection fraction is mildly decreased (45-54%).  The study is affected by artifacts however there appears to be an ischemia in the basal and mid inferior and inferolateral walls and in the septal, inferior and lateral walls.  Overall LVEF is decreased calculated at 51%.   Plan for [facility] tomorrow. The patient understands that risks include but are not limited to stroke (1 in 1000), death (1 in 14), kidney failure [usually temporary] (1 in 500), bleeding (1 in 200), allergic reaction [possibly serious] (1 in 200), and agrees to proceed.      The various methods of treatment have been discussed with the patient and family. After consideration of risks, benefits and other options for treatment, the patient has consented to  Procedure(s): LEFT HEART CATH AND CORONARY ANGIOGRAPHY (N/A) with possible Percutaneous Coronary Intervention as a surgical intervention .  The patient's history has been reviewed, patient examined, no change in status, stable for surgery.  I have reviewed the patient's chart and labs.  Questions were answered to the patient's satisfaction.    Cath Lab Visit (complete for each Cath Lab visit)  Clinical Evaluation Leading to the Procedure:   ACS: No.  Non-ACS:    Anginal Classification: CCS III  Anti-ischemic medical therapy: Minimal Therapy (1 class of  medications)  Non-Invasive Test Results: High-risk stress test findings: cardiac mortality >3%/year  Prior CABG: No previous CABG   Glenetta Hew

## 2017-09-25 DIAGNOSIS — Z720 Tobacco use: Secondary | ICD-10-CM

## 2017-09-25 LAB — BASIC METABOLIC PANEL
Anion gap: 10 (ref 5–15)
BUN: 10 mg/dL (ref 6–20)
CO2: 24 mmol/L (ref 22–32)
Calcium: 8.6 mg/dL — ABNORMAL LOW (ref 8.9–10.3)
Chloride: 107 mmol/L (ref 101–111)
Creatinine, Ser: 0.85 mg/dL (ref 0.44–1.00)
GFR calc Af Amer: >60 mL/min (ref >60)
GFR calc non Af Amer: >60 mL/min (ref >60)
Glucose, Bld: 104 mg/dL — ABNORMAL HIGH (ref 65–99)
Potassium: 4 mmol/L (ref 3.5–5.1)
Sodium: 141 mmol/L (ref 135–145)

## 2017-09-25 LAB — CBC
HCT: 41.6 % (ref 36.0–46.0)
Hemoglobin: 13.7 g/dL (ref 12.0–15.0)
MCH: 31.1 pg (ref 26.0–34.0)
MCHC: 32.9 g/dL (ref 30.0–36.0)
MCV: 94.5 fL (ref 78.0–100.0)
Platelets: 209 10*3/uL (ref 150–400)
RBC: 4.4 MIL/uL (ref 3.87–5.11)
RDW: 13.5 % (ref 11.5–15.5)
WBC: 9.8 10*3/uL (ref 4.0–10.5)

## 2017-09-25 MED ORDER — DILTIAZEM HCL ER COATED BEADS 180 MG PO CP24: 180.0000 mg | ORAL_CAPSULE | Freq: Every day | ORAL | 0 refills | Status: DC

## 2017-09-25 MED ORDER — DILTIAZEM HCL 90 MG PO TABS
45.0000 mg | ORAL_TABLET | Freq: Four times a day (QID) | ORAL | Status: DC
  Filled 2017-09-25: qty 0.5

## 2017-09-25 MED ORDER — APIXABAN 5 MG PO TABS: 5.0000 mg | ORAL_TABLET | Freq: Two times a day (BID) | ORAL | 0 refills | Status: DC

## 2017-09-25 MED ORDER — METOPROLOL TARTRATE 25 MG PO TABS: 25.0000 mg | ORAL_TABLET | Freq: Two times a day (BID) | ORAL | 0 refills | Status: DC

## 2017-09-25 MED ORDER — DILTIAZEM HCL ER COATED BEADS 180 MG PO CP24
180.0000 mg | ORAL_CAPSULE | Freq: Every day | ORAL | Status: DC
  Administered 2017-09-25: 180 mg via ORAL
  Filled 2017-09-25: qty 1

## 2017-09-25 NOTE — Plan of Care (Signed)
  Activity: Risk for activity intolerance will decrease 09/25/2017 1154 - Completed/Met by Shanon Rosser, RN   Nutrition: Adequate nutrition will be maintained 09/25/2017 1154 - Completed/Met by Shanon Rosser, RN   Coping: Level of anxiety will decrease 09/25/2017 1154 - Completed/Met by Shanon Rosser, RN   Elimination: Will not experience complications related to urinary retention 09/25/2017 1154 - Completed/Met by Shanon Rosser, RN   Pain Managment: General experience of comfort will improve 09/25/2017 1154 - Completed/Met by Shanon Rosser, RN   Safety: Ability to remain free from injury will improve 09/25/2017 1154 - Completed/Met by Shanon Rosser, RN

## 2017-09-25 NOTE — Discharge Summary (Signed)
Physician Discharge Summary  Michelle Gonzales:[id number] DOB: 05/23/1941 DOA: 09/21/2017  PCP: [name], MD  Admit date: 09/21/2017 Discharge date: 09/25/2017  Admitted From: Home Disposition: Home  Recommendations for Outpatient Follow-up:  1. Follow up with cardiology in 1 week 2. Patient started on diltiazem 180 mg CD  Home Health: None Equipment/Devices: None  Discharge Condition:  Stable, improved CODE STATUS: Full code Diet recommendation: Healthy heart  Brief/Interim Summary:  The patient is a 77 year old female with history of right breast cancer on tamoxifen, history of tobacco abuse, Sjogren's disease who presented to the emergency department with substernal chest pain and tightness radiating to her back.  She was found to be in A. fib with RVR.  She converted to sinus rhythm spontaneously and then reconverted to A. fib.  She was finally rate controlled on diltiazem infusion, metoprolol.  She again converted to sinus rhythm on the date of discharge.  She is on Eliquis.  Her chest pain she underwent a nuclear medicine Myoview on 3/5 which was determined to be high risk.  She therefore underwent a left heart catheterization on 3/6 which demonstrated 20% stenosis of the proximal LAD to mid LAD and a proximal RCA lesion that was also 20% stenosed.  Otherwise her coronary arteries were normal.    Discharge Diagnoses:  Principal Problem:   Chest pain Active Problems:   Breast cancer of upper-outer quadrant of right female breast (HCC)   Allergy   COPD (chronic obstructive pulmonary disease) (HCC)   Tobacco abuse   Anxiety   Depression   Leukocytosis   Atrial fibrillation with RVR (HCC)   Abnormal cardiovascular stress test  Paroxysmal, new onset atrial fibrillation ([place] VASC- 3) -echo: normal EF, trivial pericardial effusion -TSH normal -eliquis at discharge -started on diltiazem 180mg  CD daily - started on metoprolol  Chest pain, resolved with rate control of  her a-fib -CE flat (suspect 1 elevation related to a fib) -high risk stress test -cath: min CAD  Tobacco abusewith nicotine withdrawal Nicotine patch given in hospital Patient states she is going to talk to her PCP about chantix Counseled cessation  Elevated Glucose Patient noted to have elevated Glucose in labs at124. No prior documented history of DM  A1C: 5.7  Leukocytosis, likely stress demargination and resolved spontaneously  History of breast cancer, on Tamoxifen, no acute issues Continue tamoxifen Follow with Dr. Burr Medico as outpatient   Chronic allergies Continue home meds and nasal sprays   Discharge Instructions  Discharge Instructions    Call MD for:  difficulty breathing, headache or visual disturbances   Complete by:  As directed    Call MD for:  persistant dizziness or light-headedness   Complete by:  As directed    Call MD for:  persistant nausea and vomiting   Complete by:  As directed    Call MD for:  severe uncontrolled pain   Complete by:  As directed    Diet - low sodium heart healthy   Complete by:  As directed    Increase activity slowly   Complete by:  As directed      Allergies as of 09/25/2017      Reactions   Demerol [meperidine] Nausea And Vomiting, Other (See Comments)   Bad headache ,  Nausea/Vomiting.   Penicillins    Has patient had a PCN reaction causing immediate rash, facial/tongue/throat swelling, SOB or lightheadedness with hypotension: YES Has patient had a PCN reaction causing severe rash involving mucus membranes or skin necrosis:  NO Has patient had a PCN reaction that required hospitalization: NO Has patient had a PCN reaction occurring within the last 10 years: NO If all of the above answers are "NO", then may proceed with Cephalosporin use.   Codeine Other (See Comments)   Massive headache   Sulfa Antibiotics Itching, Rash      Medication List    STOP taking these medications   aspirin 81 MG chewable tablet    naproxen sodium 220 MG tablet Commonly known as:  ALEVE     TAKE these medications   apixaban 5 MG Tabs tablet Commonly known as:  ELIQUIS Take 1 tablet (5 mg total) by mouth 2 (two) times daily.   azelastine 0.1 % nasal spray Commonly known as:  ASTELIN Place 3 sprays into the nose daily. Use in each nostril as directed   beta carotene w/minerals tablet Take 1 tablet by mouth daily.   cholecalciferol 1000 units tablet Commonly known as:  VITAMIN D Take 1,000 Units by mouth daily.   diltiazem 180 MG 24 hr capsule Commonly known as:  CARDIZEM CD Take 1 capsule (180 mg total) by mouth daily. Start taking on:  09/26/2017   Fluticasone-Salmeterol 100-50 MCG/DOSE Aepb Commonly known as:  ADVAIR Inhale 2 puffs into the lungs every morning.   LUBRICATING EYE DROPS OP Place 1 drop into both eyes daily as needed (Dry Eyes).   metoprolol tartrate 25 MG tablet Commonly known as:  LOPRESSOR Take 1 tablet (25 mg total) by mouth 2 (two) times daily.   montelukast 10 MG tablet Commonly known as:  SINGULAIR Take 10 mg by mouth at bedtime.   OVER THE COUNTER MEDICATION 1 tablet daily. Osteo biflex   tamoxifen 20 MG tablet Commonly known as:  NOLVADEX Take 1 tablet (20 mg total) daily by mouth.   vitamin C 500 MG tablet Commonly known as:  ASCORBIC ACID Take 500 mg by mouth daily.      Follow-up Information    Daune Perch, NP Follow up on 10/03/2017.   Specialty:  Nurse Practitioner Why:  Cardiology appointment: please arrive 15 minutes early for your 8:45 am appointment Contact information: [address] [place] [address] [phone]        [name], MD Follow up.   Specialty:  Family Medicine Contact information: [address] [place] [address] [phone]          Allergies  Allergen Reactions  . Demerol [Meperidine] Nausea And Vomiting and Other (See Comments)    Bad headache ,  Nausea/Vomiting.  Marland Kitchen Penicillins      Has patient had a PCN reaction causing immediate rash, facial/tongue/throat swelling, SOB or lightheadedness with hypotension: YES Has patient had a PCN reaction causing severe rash involving mucus membranes or skin necrosis: NO Has patient had a PCN reaction that required hospitalization: NO Has patient had a PCN reaction occurring within the last 10 years: NO If all of the above answers are "NO", then may proceed with Cephalosporin use.   . Codeine Other (See Comments)    Massive headache  . Sulfa Antibiotics Itching and Rash    Consultations: [facility]   Procedures/Studies: Dg Chest 2 View  Result Date: 09/21/2017 CLINICAL DATA:  77 y/o F; centralized chest pain and shortness of breath. EXAM: CHEST  2 VIEW COMPARISON:  01/27/2013 chest radiograph FINDINGS: Stable normal cardiac silhouette given projection and technique. Reticular opacities of the lungs. No focal consolidation, effusion, or pneumothorax. Stable  chronic T5 compression deformity. No acute osseous abnormality identified. Anterior cervical fusion hardware noted. IMPRESSION: Reticular opacities of the lungs, probably bronchitic changes. No consolidation. Stable cardiac silhouette. Electronically Signed   By: Kristine Garbe M.D.   On: 09/21/2017 05:46   Nm Myocar Multi W/spect W/wall Motion / Ef  Result Date: 09/23/2017  There was no ST segment deviation noted during stress.  No T wave inversion was noted during stress.  Defect 1: There is a large defect of mild severity present in the basal inferior, basal inferolateral, mid inferior, mid inferolateral, apical septal, apical inferior and apical lateral location.  Findings consistent with ischemia.  This is a high risk study.  The left ventricular ejection fraction is mildly decreased (45-54%).  The study is affected by artifacts however there appears to be an ischemia in the basal and mid inferior and inferolateral walls and in the septal, inferior and  lateral walls. Overall LVEF is decreased calculated at 51%.   Ct Angio Chest/abd/pel For Dissection W And/or Wo Contrast  Result Date: 09/21/2017 CLINICAL DATA:  Chest pain radiating to back EXAM: CT ANGIOGRAPHY CHEST, ABDOMEN AND PELVIS TECHNIQUE: Multidetector CT imaging through the chest, abdomen and pelvis was performed using the standard protocol during bolus administration of intravenous contrast. Multiplanar reconstructed images and MIPs were obtained and reviewed to evaluate the vascular anatomy. CONTRAST:  172mL ISOVUE-370 IOPAMIDOL (ISOVUE-370) INJECTION 76% COMPARISON:  Chest radiograph dated 09/21/2017. FINDINGS: CTA CHEST FINDINGS Cardiovascular: On unenhanced CT, there is no evidence of mediastinal hematoma. Preferential opacification of the thoracic aorta. No evidence of thoracic aortic aneurysm or dissection. Mild atherosclerotic calcifications of the aortic arch. Although not tailored for evaluation of the pulmonary arteries, there is no evidence of pulmonary embolism to the segmental level. The heart is normal in size.  No pericardial effusion. Mediastinum/Nodes: No suspicious mediastinal lymphadenopathy. Visualized thyroid is unremarkable. Mild centrilobular emphysematous changes, upper lobe predominant. Lungs/Pleura: Mild dependent atelectasis in the bilateral lower lobes. No focal consolidation. No suspicious pulmonary nodules. No pleural effusion or pneumothorax. Musculoskeletal: Prior vertebral augmentation at T5. Review of the MIP images confirms the above findings. CTA ABDOMEN AND PELVIS FINDINGS VASCULAR Aorta: No evidence of abdominal aortic aneurysm or dissection. Atherosclerotic calcifications. Celiac: Patent. SMA: Patent. Renals: Patent bilaterally, with mild atherosclerotic calcifications at the origin. IMA: Patent. Inflow: Patent, with atherosclerotic calcifications. Veins: Unremarkable. Review of the MIP images confirms the above findings. NON-VASCULAR Hepatobiliary: Liver is  grossly unremarkable. Status post cholecystectomy. No intrahepatic or extrahepatic ductal dilatation. Pancreas: Within normal limits. Spleen: Within normal limits. Adrenals/Urinary Tract: Adrenal glands are within normal limits. Left renal cysts, including a dominant 3.2 cm interpolar cyst (series 6/image 149). Suspected nonobstructing right renal calculi measuring 2-3 mm (series 6/image 153). No hydronephrosis. Bladder is within normal limits. Stomach/Bowel: Stomach is within normal limits. No evidence of bowel obstruction. Normal appendix (series 6/image 230). Lymphatic: No suspicious abdominopelvic lymphadenopathy. Reproductive: Status post hysterectomy. Bilateral ovaries are within normal limits. Other: No abdominopelvic ascites. Small fat containing right inguinal hernia. Musculoskeletal: No focal osseous lesions. Review of the MIP images confirms the above findings. IMPRESSION: No evidence of thoracoabdominal aortic aneurysm or dissection. No evidence of pulmonary embolism. Suspected nonobstructing right renal calculi measuring 2-3 mm. No hydronephrosis. Additional ancillary findings as above. Electronically Signed   By: Julian Hy M.D.   On: 09/21/2017 07:27   Nuclear medicine stress test on 3/5 high risk  Left heart catheterization on 3/6:  Angiographically minimal coronary arteries.  Normal LV function.  Normal LVEDP.  ECHO: Left ventricle: The cavity size was normal. There was mild   concentric hypertrophy. Systolic function was normal. The   estimated ejection fraction was in the range of 55% to 60%. Wall   motion was normal; there were no regional wall motion   abnormalities. - Aortic valve: There was mild regurgitation. - Mitral valve: Mild focal calcification of the anterior leaflet   (medial segment(s)). There was trivial regurgitation. - Pericardium, extracardiac: A trivial, free-flowing pericardial   effusion was identified along the right ventricular free wall.   The fluid  had no internal echoes.   Subjective: Denies chest pains, pressure, shortness of breath, nausea, palpitations.  Feels well and she would like to go home today.  She has been eating and drinking and ambulating around her room without difficulty.  Discharge Exam: Vitals:   09/25/17 0909 09/25/17 1333  BP:  117/69  Pulse:  79  Resp:    Temp:  98 F (36.7 C)  SpO2: 97% 94%   Vitals:   09/25/17 0508 09/25/17 0542 09/25/17 0909 09/25/17 1333  BP: (!) 143/94   117/69  Pulse: 88   79  Resp: 16     Temp: 98.1 F (36.7 C)   98 F (36.7 C)  TempSrc: Oral   Oral  SpO2: 97%  97% 94%  Weight:  69.8 kg (153 lb 14.4 oz)    Height:        General: Pt is alert, awake, not in acute distress Cardiovascular: RRR, S1/S2 +, no rubs, no gallops (spontaneously converted to normal sinus rhythm while I was in the room) Respiratory: CTA bilaterally, no wheezing, no rhonchi Abdominal: Soft, NT, ND, bowel sounds + Extremities: no edema, no cyanosis    The results of significant diagnostics from this hospitalization (including imaging, microbiology, ancillary and laboratory) are listed below for reference.     Microbiology: No results found for this or any previous visit (from the past 240 hour(s)).   Labs: BNP (last 3 results) No results for input(s): BNP in the last 8760 hours. Basic Metabolic Panel: Recent Labs  Lab 09/21/17 0457 09/22/17 0516 09/23/17 0340 09/25/17 0514  NA 137 133* 141 141  K 4.2 4.1 3.7 4.0  CL 102 104 107 107  CO2 25 21* 23 24  GLUCOSE 124* 119* 102* 104*  BUN 9 9 9 10   CREATININE 0.82 0.69 0.75 0.85  CALCIUM 8.5* 7.7* 8.1* 8.6*   Liver Function Tests: No results for input(s): AST, ALT, ALKPHOS, BILITOT, PROT, ALBUMIN in the last 168 hours. No results for input(s): LIPASE, AMYLASE in the last 168 hours. No results for input(s): AMMONIA in the last 168 hours. CBC: Recent Labs  Lab 09/21/17 0457 09/22/17 0516 09/23/17 0340 09/24/17 0548 09/25/17 0514   WBC 20.8* 13.1* 10.9* 12.3* 9.8  NEUTROABS 17.3*  --   --   --   --   HGB 13.9 12.3 11.9* 13.4 13.7  HCT 42.0 37.6 36.9 40.9 41.6  MCV 94.0 94.2 94.4 94.2 94.5  PLT 177 156 156 212 209   Cardiac Enzymes: Recent Labs  Lab 09/21/17 1001 09/21/17 1505 09/21/17 1841  TROPONINI <0.03 0.04* <0.03   BNP: Invalid input(s): POCBNP CBG: No results for input(s): GLUCAP in the last 168 hours. D-Dimer No results for input(s): DDIMER in the last 72 hours. Hgb A1c No results for input(s): HGBA1C in the last 72 hours. Lipid Profile No results for input(s): CHOL, HDL, LDLCALC, TRIG, CHOLHDL, LDLDIRECT in the last 72  hours. Thyroid function studies No results for input(s): TSH, T4TOTAL, T3FREE, THYROIDAB in the last 72 hours.  Invalid input(s): FREET3 Anemia work up No results for input(s): VITAMINB12, FOLATE, FERRITIN, TIBC, IRON, RETICCTPCT in the last 72 hours. Urinalysis    Component Value Date/Time   COLORURINE YELLOW 09/21/2017 1020   APPEARANCEUR CLEAR 09/21/2017 1020   LABSPEC >1.046 (H) 09/21/2017 1020   PHURINE 6.0 09/21/2017 1020   GLUCOSEU NEGATIVE 09/21/2017 1020   HGBUR NEGATIVE 09/21/2017 1020   BILIRUBINUR NEGATIVE 09/21/2017 1020   KETONESUR NEGATIVE 09/21/2017 1020   PROTEINUR NEGATIVE 09/21/2017 1020   NITRITE NEGATIVE 09/21/2017 1020   LEUKOCYTESUR NEGATIVE 09/21/2017 1020   Sepsis Labs Invalid input(s): PROCALCITONIN,  WBC,  LACTICIDVEN   Time coordinating discharge: Over 30 minutes  SIGNED:   Janece Canterbury, MD  [facility] 09/25/2017, 2:36 PM Pager   If 7PM-7AM, please contact night-coverage [email or web address] Password [email or web address]

## 2017-09-25 NOTE — Progress Notes (Signed)
Went to check on pt and during that time pt complained of some chest tightness. Performed EKG and it was normal. Pt refused nitro. Put pt on 2L of O2. Pt stated that there is some relief. Will continue to monitor.  Albertina [place] E

## 2017-09-25 NOTE — Progress Notes (Addendum)
Progress Note  Patient Name: Michelle Gonzales Date of Encounter: 09/25/2017  Primary Cardiologist: New to [facility], Dr. Acie Fredrickson  Subjective   Patient reports feeling good this AM. No reoccurrence of chest pain. No palpitations or SOB.  Inpatient Medications    Scheduled Meds: . apixaban  5 mg Oral BID  . aspirin EC  81 mg Oral Daily  . azelastine  1 spray Each Nare BID  . cholecalciferol  1,000 Units Oral Daily  . metoprolol tartrate  25 mg Oral BID  . mometasone-formoterol  2 puff Inhalation BID  . montelukast  10 mg Oral QHS  . nicotine  7 mg Transdermal Daily  . sodium chloride flush  3 mL Intravenous Q12H  . tamoxifen  20 mg Oral Daily   Continuous Infusions: . sodium chloride    . diltiazem (CARDIZEM) infusion 5 mg/hr (09/25/17 0618)   PRN Meds: sodium chloride, acetaminophen, ALPRAZolam, bisacodyl, gi cocktail, hydrALAZINE, ketorolac, morphine injection, ondansetron (ZOFRAN) IV, oxyCODONE-acetaminophen, sodium chloride flush   Vital Signs    Vitals:   09/24/17 2051 09/24/17 2354 09/25/17 0508 09/25/17 0542  BP: 116/71 130/81 (!) 143/94   Pulse: 84 82 88   Resp:  18 16   Temp: 98.1 F (36.7 C) 98.2 F (36.8 C) 98.1 F (36.7 C)   TempSrc: Oral Oral Oral   SpO2: 97% 93% 97%   Weight:    153 lb 14.4 oz (69.8 kg)  Height:        Intake/Output Summary (Last 24 hours) at 09/25/2017 0751 Last data filed at 09/25/2017 0700 Gross per 24 hour  Intake 486.29 ml  Output 825 ml  Net -338.71 ml   Filed Weights   09/23/17 0316 09/24/17 0501 09/25/17 0542  Weight: 158 lb 8 oz (71.9 kg) 154 lb 8 oz (70.1 kg) 153 lb 14.4 oz (69.8 kg)    Telemetry    NSR with brief episodes of atrial fibrillation with RVR this AM.  - Personally Reviewed  Physical Exam   GEN: well developed, well nourished female; laying in bed in no acute distress.   Neck: No JVD, no carotid bruits Cardiac: RRR, no murmurs, rubs, or gallops.  Respiratory: Clear to auscultation bilaterally, no  wheezes/ rales/ rhonchi GI: NABS, Soft, nontender, non-distended  MS: No edema; No deformity. Neuro:  Nonfocal, moving all extremities spontaneously Psych: Normal affect   Labs    Chemistry Recent Labs  Lab 09/22/17 0516 09/23/17 0340 09/25/17 0514  NA 133* 141 141  K 4.1 3.7 4.0  CL 104 107 107  CO2 21* 23 24  GLUCOSE 119* 102* 104*  BUN 9 9 10   CREATININE 0.69 0.75 0.85  CALCIUM 7.7* 8.1* 8.6*  GFRNONAA >60 >60 >60  GFRAA >60 >60 >60  ANIONGAP 8 11 10      Hematology Recent Labs  Lab 09/22/17 0516 09/23/17 0340 09/24/17 0548  WBC 13.1* 10.9* 12.3*  RBC 3.99 3.91 4.34  HGB 12.3 11.9* 13.4  HCT 37.6 36.9 40.9  MCV 94.2 94.4 94.2  MCH 30.8 30.4 30.9  MCHC 32.7 32.2 32.8  RDW 13.3 13.5 13.3  PLT 156 156 212    Cardiac Enzymes Recent Labs  Lab 09/21/17 1001 09/21/17 1505 09/21/17 1841  TROPONINI <0.03 0.04* <0.03    Recent Labs  Lab 09/21/17 0505 09/21/17 0819  TROPIPOC 0.01 0.00     BNPNo results for input(s): BNP, PROBNP in the last 168 hours.   DDimer No results for input(s): DDIMER in the last 168  hours.   Radiology    Nm Myocar Multi W/spect W/wall Motion / Ef  Result Date: 09/23/2017  There was no ST segment deviation noted during stress.  No T wave inversion was noted during stress.  Defect 1: There is a large defect of mild severity present in the basal inferior, basal inferolateral, mid inferior, mid inferolateral, apical septal, apical inferior and apical lateral location.  Findings consistent with ischemia.  This is a high risk study.  The left ventricular ejection fraction is mildly decreased (45-54%).  The study is affected by artifacts however there appears to be an ischemia in the basal and mid inferior and inferolateral walls and in the septal, inferior and lateral walls. Overall LVEF is decreased calculated at 51%.    Cardiac Studies   Echocardiogram 09/22/2017: Study Conclusions  - Left ventricle: The cavity size was  normal. There was mild   concentric hypertrophy. Systolic function was normal. The   estimated ejection fraction was in the range of 55% to 60%. Wall   motion was normal; there were no regional wall motion   abnormalities. - Aortic valve: There was mild regurgitation. - Mitral valve: Mild focal calcification of the anterior leaflet   (medial segment(s)). There was trivial regurgitation. - Pericardium, extracardiac: A trivial, free-flowing pericardial   effusion was identified along the right ventricular free wall.   The fluid had no internal echoes.  NST 09/23/2017:  There was no ST segment deviation noted during stress.  No T wave inversion was noted during stress.  Defect 1: There is a large defect of mild severity present in the basal inferior, basal inferolateral, mid inferior, mid inferolateral, apical septal, apical inferior and apical lateral location.  Findings consistent with ischemia.  This is a high risk study.  The left ventricular ejection fraction is mildly decreased (45-54%).   The study is affected by artifacts however there appears to be an ischemia in the basal and mid inferior and inferolateral walls and in the septal, inferior and lateral walls.  Overall LVEF is decreased calculated at 51%.   Cardiac Catheterization 09/24/2017: Conclusion     Prox LAD to Mid LAD lesion is 20% stenosed. Prox RCA lesion is 20% stenosed. -Otherwise essentially normal coronary arteries with no culprit lesion to explain abnormal stress test.  The left ventricular systolic function is normal. The left ventricular ejection fraction is 55-65% by visual estimate.  LV end diastolic pressure is normal.  There is mild (2+) mitral regurgitation.   Angiographically minimal coronary arteries.  Normal LV function.  Normal LVEDP. Suspect that chest pain is related to atrial fibrillation as opposed to ischemic CAD related angina.  Plan: Return to nursing unit for ongoing care. We  will start Eliquis 5 mg twice daily starting tonight. Continue treatment of A. Fib -consider possible cardioversion either staged as an outpatient or inpatient TEE.      Patient Profile     77 y.o. female with PMH of R breast cancer on tamoxifen and tobacco abuse, who presented with chest pain, found to have atrial fibrillation.   Assessment & Plan    1. Atrial fibrillation: presented with chest discomfort and was found to have atrial fibrillation. She was started on a diltiazem gtt and converted back to NSR. She was transitioned to po metoprolol, however had a reoccurrence of Afib RVR requiring reinitiation of diltiazem gtt. HR improved over the last 24 hours.  - Can transition off gtt this am after initiation of po diltiazem - Will start  diltiazem 45mg  q6hr - plan to discharge on long acting 180mg  daily - Continue metoprolol 25mg  BID - Continue eliquis BID for CHADS2VASC score of 3 (Female, age 65)  2. Chest pain: patient presented with chest discomfort likely 2/2 new onset atrial fibrillation. Troponin 0.03>0.04>0.03. CT C/A/P without PE or aortic dissection. Echo 3/4 with EF 55-60%, no regional wall motion abnormalities. NST was abnormal 3/5. Patient underwent LHC 3/6 with minimal CAD.  - No further work-up at this point  Cardiology follow-up scheduled. Anticipate she may be stable for discharge home later today if HR maintained on po therapy.    For questions or updates, please contact [facility] Please consult [email or web address] for contact info under Cardiology/STEMI.      Signed, Abigail Butts, PA-C  09/25/2017, 7:51 AM   [phone]   Attending Note:   The patient was seen and examined.  Agree with assessment and plan as noted above.  Changes made to the above note as needed.  Patient seen and independently examined with Roby Lofts, PA .   We discussed all aspects of the encounter. I agree with the assessment and plan as stated above.  1.  Paroxysmal atrial  for ablation: The patient continues to have paroxysmal atrial fibrillation .  She is currently in sinus rhythm.  We will add diltiazem 180 mg CD to her current dose of metoprolol.  Discontinue the diltiazem drip.  She is on Eliquis.  We have an appointment to see her next week.  We may consider the atrial fibrillation clinic in the future.   I have spent a total of 40 minutes with patient reviewing hospital  notes , telemetry, EKGs, labs and examining patient as well as establishing an assessment and plan that was discussed with the patient. > 50% of time was spent in direct patient care.   Thayer Headings, Brooke Bonito., MD, [facility] 09/25/2017, 9:45 AM [address]  [place] Pager [phone]

## 2017-10-02 NOTE — Progress Notes (Signed)
Cardiology Office Note:    Date:  10/03/2017   ID:  Michelle Gonzales, DOB 1940/09/17, MRN [id number]  PCP:  [name], MD  Cardiologist:  Mertie Moores, MD  Referring MD: [name], MD   Chief Complaint  Patient presents with  . Follow-up    atrial fibrillation    History of Present Illness:    Michelle Gonzales is a 77 y.o. female with a past medical history significant for right breast cancer (stage I), tobacco abuse, and recently paroxysmal atrial fibrillation on Eliquis.  The patient was admitted to the hospital on 09/21/17 with complaints of chest pain and was found to be in atrial fibrillation with rapid ventricular response. EKG was without ischemic changes.  She was started on diltiazem drip and converted to sinus rhythm and transitioned to metoprolol but developed afib again. Oral diltiazem was initiated and she was discharged in sinus rhythm.   Troponins were mildly elevated at 0.04. CTA was without PE or aortic dissection. Echo on 3/4 showed EF 55-60% with no RWMA. Nuclear stress test was abnormal ion 3/5. She underwent cardiac cath on 09/24/17 that showed minimal CAD.   Michelle Gonzales is here today for close follow up with her husband.   Still has occ central chest pressure that lasts a couple of hours, non-radiating and unrelated to activity without dyspnea. Every couple of days. Similar to her presenting chest pain at the hospital but not nearly as sever. No lightheadedness, syncope or palpitations. She is having extreme fatigue that she attributes to the medications and is unable to do her usual activities.    Past Medical History:  Diagnosis Date  . Allergy    Pcn, Sulfa, Codeine  . Anxiety   . Arthritis   . Atrial fibrillation (Michelle Gonzales) 09/22/2017  . Cataract    surgey b/l  . COPD (chronic obstructive pulmonary disease) ([place])   . Cough   . Depression    hx years ago  . Hot flashes   . Shortness of breath   . Sjogren's disease [facility])     Past Surgical History:    Procedure Laterality Date  . ABDOMINAL HYSTERECTOMY  74   partial  . BREAST LUMPECTOMY WITH NEEDLE LOCALIZATION AND AXILLARY SENTINEL LYMPH NODE BX Right 02/01/2013   Procedure: BREAST LUMPECTOMY WITH NEEDLE LOCALIZATION AND AXILLARY SENTINEL LYMPH NODE BX;  Surgeon: Odis Hollingshead, MD;  Location: [place];  Service: General;  Laterality: Right;  . BREAST SURGERY Right 1965   biopsy  . CERVICAL FUSION  2010  . CHOLECYSTECTOMY  2002  . COLONOSCOPY    . DILATION AND CURETTAGE OF UTERUS    . LEFT HEART CATH AND CORONARY ANGIOGRAPHY N/A 09/24/2017   Procedure: LEFT HEART CATH AND CORONARY ANGIOGRAPHY;  Surgeon: Leonie Man, MD;  Location: [place] [facility];  Service: Cardiovascular;  Laterality: N/A;  . ULTRASOUND GUIDANCE FOR VASCULAR ACCESS Right 09/24/2017   Procedure: Ultrasound Guidance For Vascular Access;  Surgeon: Leonie Man, MD;  Location: [place] [facility];  Service: Cardiovascular;  Laterality: Right;    Current Medications: Current Meds  Medication Sig  . apixaban (ELIQUIS) 5 MG TABS tablet Take 1 tablet (5 mg total) by mouth 2 (two) times daily.  Marland Kitchen azelastine (ASTELIN) 137 MCG/SPRAY nasal spray Place 3 sprays into the nose daily. Use in each nostril as directed   . beta carotene w/minerals (OCUVITE) tablet Take 1 tablet by mouth daily.  . Carboxymethylcellul-Glycerin (LUBRICATING EYE DROPS OP) Place  1 drop into both eyes daily as needed (Dry Eyes).  Hendricks Limes CONTINUING MONTH PAK 1 MG tablet Take 1 tablet by mouth 2 (two) times daily. see package  . CHANTIX STARTING MONTH PAK 0.5 MG X 11 & 1 MG X 42 tablet Take 1 tablet by mouth 2 (two) times daily. see package  . cholecalciferol (VITAMIN D) 1000 units tablet Take 1,000 Units by mouth daily.   . Fluticasone-Salmeterol (ADVAIR) 100-50 MCG/DOSE AEPB Inhale 2 puffs into the lungs every morning.  . metoprolol tartrate (LOPRESSOR) 25 MG tablet Take 1 tablet (25 mg total) by mouth 2 (two) times daily.  .  Misc Natural Products (OSTEO BI-FLEX TRIPLE STRENGTH PO) Take 1 tablet by mouth daily.  . montelukast (SINGULAIR) 10 MG tablet Take 10 mg by mouth at bedtime.   . tamoxifen (NOLVADEX) 20 MG tablet Take 1 tablet (20 mg total) daily by mouth.  . vitamin C (ASCORBIC ACID) 500 MG tablet Take 500 mg by mouth daily.   . [DISCONTINUED] diltiazem (CARDIZEM CD) 180 MG 24 hr capsule Take 1 capsule (180 mg total) by mouth daily.     Allergies:   Demerol [meperidine]; Penicillins; Codeine; and Sulfa antibiotics   Social History   Socioeconomic History  . Marital status: Married    Spouse name: None  . Number of children: None  . Years of education: None  . Highest education level: None  Social Needs  . Financial resource strain: None  . Food insecurity - worry: None  . Food insecurity - inability: None  . Transportation needs - medical: None  . Transportation needs - non-medical: None  Occupational History  . None  Tobacco Use  . Smoking status: Current Every Day Smoker    Packs/day: 0.50    Types: Cigarettes  . Smokeless tobacco: Never Used  Substance and Sexual Activity  . Alcohol use: Yes    Comment: rare  . Drug use: No  . Sexual activity: Yes  Other Topics Concern  . None  Social History Narrative  . None     Family History: The patient's family history includes Breast cancer in her mother; Cancer in her mother. ROS:   Please see the history of present illness.     All other systems reviewed and are negative.  EKGs/Labs/Other Studies Reviewed:    The following studies were reviewed today:  Echocardiogram 09/22/2017: Study Conclusions - Left ventricle: The cavity size was normal. There was mild concentric hypertrophy. Systolic function was normal. The estimated ejection fraction was in the range of 55% to 60%. Wall motion was normal; there were no regional wall motion abnormalities. - Aortic valve: There was mild regurgitation. - Mitral valve: Mild focal  calcification of the anterior leaflet (medial segment(s)). There was trivial regurgitation. - Pericardium, extracardiac: A trivial, free-flowing pericardial effusion was identified along the right ventricular free wall. The fluid had no internal echoes.  NST 09/23/2017:  There was no ST segment deviation noted during stress.  No T wave inversion was noted during stress.  Defect 1: There is a large defect of mild severity present in the basal inferior, basal inferolateral, mid inferior, mid inferolateral, apical septal, apical inferior and apical lateral location.  Findings consistent with ischemia.  This is a high risk study.  The left ventricular ejection fraction is mildly decreased (45-54%).  The study is affected by artifacts however there appears to be an ischemia in the basal and mid inferior and inferolateral walls and in the septal, inferior and lateral  walls.  Overall LVEF is decreased calculated at 51%.   Cardiac Catheterization 09/24/2017: Conclusion    Prox LAD to Mid LAD lesion is 20% stenosed. Prox RCA lesion is 20% stenosed. -Otherwise essentially normal coronary arteries with no culprit lesion to explain abnormal stress test.  The left ventricular systolic function is normal. The left ventricular ejection fraction is 55-65% by visual estimate.  LV end diastolic pressure is normal.  There is mild (2+) mitral regurgitation.  Angiographically minimal coronary arteries. Normal LV function. Normal LVEDP. Suspect that chest pain is related to atrial fibrillation as opposed to ischemic CAD related angina.  Plan: Return to nursing unit for ongoing care. We will start Eliquis 5 mg twice daily starting tonight. Continue treatment of A. Fib -consider possible cardioversion either staged as an outpatient or inpatient TEE.    EKG:  EKG is ordered today.  The ekg ordered today demonstrates NSR 86 bpm.   Recent Labs: 04/21/2017: ALT 13 09/22/2017: TSH  1.202 09/25/2017: BUN 10; Creatinine, Ser 0.85; Hemoglobin 13.7; Platelets 209; Potassium 4.0; Sodium 141   Recent Lipid Panel    Component Value Date/Time   CHOL 171 09/21/2017 1001   TRIG 117 09/21/2017 1001   HDL 53 09/21/2017 1001   CHOLHDL 3.2 09/21/2017 1001   VLDL 23 09/21/2017 1001   LDLCALC 95 09/21/2017 1001    Physical Exam:    VS:  BP 104/78   Pulse 86   Ht 5\' 1"  (1.549 m)   Wt 157 lb 1.9 oz (71.3 kg)   BMI 29.69 kg/m     Wt Readings from Last 3 Encounters:  10/03/17 157 lb 1.9 oz (71.3 kg)  09/25/17 153 lb 14.4 oz (69.8 kg)  04/21/17 158 lb 12.8 oz (72 kg)     Physical Exam  Constitutional: She is oriented to person, place, and time. She appears well-developed and well-nourished. No distress.  HENT:  Head: Normocephalic and atraumatic.  Neck: Normal range of motion. Neck supple. No JVD present.  Cardiovascular: Normal rate, regular rhythm and normal heart sounds. Exam reveals no gallop and no friction rub.  No murmur heard. Pulmonary/Chest: Effort normal and breath sounds normal. No respiratory distress. She has no wheezes. She has no rales.  Abdominal: Soft. Bowel sounds are normal. She exhibits no distension. There is no tenderness.  Musculoskeletal: Normal range of motion. She exhibits no edema.  Neurological: She is alert and oriented to person, place, and time.  Skin: Skin is warm and dry.  Psychiatric: She has a normal mood and affect. Her behavior is normal. Thought content normal.    ASSESSMENT:    1. Paroxysmal atrial fibrillation (HCC)   2. Tobacco abuse    PLAN:    In order of problems listed above:  Paroxysmal atrial fibrillation: Currently in sinus rhythm. Pt is having intermittent central chest pressure similar to her presenting chest pain at the hospital but not nearly as severe. No dyspnea. She had no significant CAD on cath. I wonder if these are episodes of afib. She is also having extreme fatigue that she relates to the medications.  Case discussed with Dr. Acie Fredrickson. I will arrange for a 30 day event monitor to assess afib burden and association with her symptoms. Will increase her cardizem for better afib control and stop metoprolol due to fatigue. BP is soft. She will monitor her BP periodically and contact us if SBP running <90.  I have discussed S/S to report. Will follow up after monitor results.  If she  continues to have symptomatic afib and medication adjustments limited by BP, can consider Flecainide for rhythm control.  CHA2DS2/VAS Stroke Risk Score is 3 (Age (3), female). Anticoagulated with Eliquis 5 mg bid for stroke risk reduction.  Tobacco abuse: Is trying to quit smoking. On Chantix and she is cutting down. Sunday is her quit day. She has quit before with Chantix and has confidence that she can quit again.     Medication Adjustments/Labs and Tests Ordered: Current medicines are reviewed at length with the patient today.  Concerns regarding medicines are outlined above. Labs and tests ordered and medication changes are outlined in the patient instructions below:  Patient Instructions  Medication Instructions:  Your physician has recommended you make the following change in your medication:  1.  INCREASE the Diltiazem to 240 mg daily (A NEW PRESCRIPTION HAS BEEN SENT TO YOUR PHARMACY)  Labwork: None ordered  Testing/Procedures: Your physician has recommended that you wear an event monitor. Event monitors are medical devices that record the heart's electrical activity. Doctors most often [place] these monitors to diagnose arrhythmias. Arrhythmias are problems with the speed or rhythm of the heartbeat. The monitor is a small, portable device. You can wear one while you do your normal daily activities. This is usually used to diagnose what is causing palpitations/syncope (passing out).    Follow-Up: Your physician recommends that you schedule a follow-up appointment in: 11/07/17 ARRIVE AT 8:30 TO SEE NINA Chrys Landgrebe,  PA-C   Any Other Special Instructions Will Be Listed Below (If Applicable).    Cardiac Event Monitoring A cardiac event monitor is a small recording device that is used to detect abnormal heart rhythms (arrhythmias). The monitor is used to record your heart rhythm when you have symptoms, such as:  Fast heartbeats (palpitations), such as heart racing or fluttering.  Dizziness.  Fainting or light-headedness.  Unexplained weakness.  Some monitors are wired to electrodes placed on your chest. Electrodes are flat, sticky disks that attach to your skin. Other monitors may be hand-held or worn on the wrist. The monitor can be worn for up to 30 days. If the monitor is attached to your chest, a technician will prepare your chest for the electrode placement and show you how to work the monitor. Take time to practice using the monitor before you leave the office. Make sure you understand how to send the information from the monitor to your health care provider. In some cases, you may need to use a landline telephone instead of a cell phone. What are the risks? Generally, this device is safe to use, but it possible that the skin under the electrodes will become irritated. How to use your cardiac event monitor  Wear your monitor at all times, except when you are in water: ? Do not let the monitor get wet. ? Take the monitor off when you bathe. Do not swim or use a hot tub with it on.  Keep your skin clean. Do not put body lotion or moisturizer on your chest.  Change the electrodes as told by your health care provider or any time they stop sticking to your skin. You may need to use medical tape to keep them on.  Try to put the electrodes in slightly different places on your chest to help prevent skin irritation. They must remain in the area under your left breast and in the upper right section of your chest.  Make sure the monitor is safely clipped to your clothing or in a location  close to your  body that your health care provider recommends.  Press the button to record as soon as you feel heart-related symptoms, such as: ? Dizziness. ? Weakness. ? Light-headedness. ? Palpitations. ? Thumping or pounding in your chest. ? Shortness of breath. ? Unexplained weakness.  Keep a diary of your activities, such as walking, doing chores, and taking medicine. It is very important to note what you were doing when you pushed the button to record your symptoms. This will help your health care provider determine what might be contributing to your symptoms.  Send the recorded information as recommended by your health care provider. It may take some time for your health care provider to process the results.  Change the batteries as told by your health care provider.  Keep electronic devices away from your monitor. This includes: ? Tablets. ? MP3 players. ? Cell phones.  While wearing your monitor you should avoid: ? Electric blankets. ? Armed forces operational officer. ? Electric toothbrushes. ? Microwave ovens. ? Magnets. ? Metal detectors. Get help right away if:  You have chest pain.  You have extreme difficulty breathing or shortness of breath.  You develop a very fast heartbeat that persists.  You develop dizziness that does not go away.  You faint or constantly feel like you are about to faint. Summary  A cardiac event monitor is a small recording device that is used to help detect abnormal heart rhythms (arrhythmias).  The monitor is used to record your heart rhythm when you have heart-related symptoms.  Make sure you understand how to send the information from the monitor to your health care provider.  It is important to press the button on the monitor when you have any heart-related symptoms.  Keep a diary of your activities, such as walking, doing chores, and taking medicine. It is very important to note what you were doing when you pushed the button to record your symptoms. This  will help your health care provider learn what might be causing your symptoms. This information is not intended to replace advice given to you by your health care provider. Make sure you discuss any questions you have with your health care provider. Document Released: 04/16/2008 Document Revised: 06/22/2016 Document Reviewed: 06/22/2016 Elsevier Interactive Patient Education  2017 [employer].   If you need a refill on your cardiac medications before your next appointment, please call your pharmacy.     Signed, Daune Perch, NP  10/03/2017 9:53 AM    [facility]

## 2017-10-03 ENCOUNTER — Ambulatory Visit: Payer: Medicare Other | Admitting: Cardiology

## 2017-10-03 ENCOUNTER — Encounter: Payer: Self-pay | Admitting: Cardiology

## 2017-10-03 ENCOUNTER — Telehealth: Payer: Self-pay | Admitting: *Deleted

## 2017-10-03 VITALS — BP 104/78 | HR 86 | Ht 61.0 in | Wt 157.1 lb

## 2017-10-03 DIAGNOSIS — I480 Paroxysmal atrial fibrillation: Secondary | ICD-10-CM | POA: Diagnosis not present

## 2017-10-03 DIAGNOSIS — Z720 Tobacco use: Secondary | ICD-10-CM

## 2017-10-03 MED ORDER — DILTIAZEM HCL ER COATED BEADS 240 MG PO CP24: 240.0000 mg | ORAL_CAPSULE | Freq: Every day | ORAL | 3 refills | Status: DC

## 2017-10-03 NOTE — Telephone Encounter (Signed)
Left pt a message to call back to make sure she remembers that Pecolia Ades, PA-C wanted her to stop her Metoprolol.

## 2017-10-03 NOTE — Addendum Note (Signed)
Addended by: Gaetano Net on: 10/03/2017 10:06 AM   Modules accepted: Orders

## 2017-10-03 NOTE — Patient Instructions (Addendum)
Medication Instructions:  Your physician has recommended you make the following change in your medication:  1.  INCREASE the Diltiazem to 240 mg daily (A NEW PRESCRIPTION HAS BEEN SENT TO YOUR PHARMACY) 2.  STOP the Metoprolol   Labwork: None ordered  Testing/Procedures: Your physician has recommended that you wear an event monitor. Event monitors are medical devices that record the heart's electrical activity. Doctors most often [place] these monitors to diagnose arrhythmias. Arrhythmias are problems with the speed or rhythm of the heartbeat. The monitor is a small, portable device. You can wear one while you do your normal daily activities. This is usually used to diagnose what is causing palpitations/syncope (passing out).    Follow-Up: Your physician recommends that you schedule a follow-up appointment in: 11/07/17 ARRIVE AT 8:30 TO SEE NINA HAMMOND, PA-C   Any Other Special Instructions Will Be Listed Below (If Applicable).    Cardiac Event Monitoring A cardiac event monitor is a small recording device that is used to detect abnormal heart rhythms (arrhythmias). The monitor is used to record your heart rhythm when you have symptoms, such as:  Fast heartbeats (palpitations), such as heart racing or fluttering.  Dizziness.  Fainting or light-headedness.  Unexplained weakness.  Some monitors are wired to electrodes placed on your chest. Electrodes are flat, sticky disks that attach to your skin. Other monitors may be hand-held or worn on the wrist. The monitor can be worn for up to 30 days. If the monitor is attached to your chest, a technician will prepare your chest for the electrode placement and show you how to work the monitor. Take time to practice using the monitor before you leave the office. Make sure you understand how to send the information from the monitor to your health care provider. In some cases, you may need to use a landline telephone instead of a cell phone. What are  the risks? Generally, this device is safe to use, but it possible that the skin under the electrodes will become irritated. How to use your cardiac event monitor  Wear your monitor at all times, except when you are in water: ? Do not let the monitor get wet. ? Take the monitor off when you bathe. Do not swim or use a hot tub with it on.  Keep your skin clean. Do not put body lotion or moisturizer on your chest.  Change the electrodes as told by your health care provider or any time they stop sticking to your skin. You may need to use medical tape to keep them on.  Try to put the electrodes in slightly different places on your chest to help prevent skin irritation. They must remain in the area under your left breast and in the upper right section of your chest.  Make sure the monitor is safely clipped to your clothing or in a location close to your body that your health care provider recommends.  Press the button to record as soon as you feel heart-related symptoms, such as: ? Dizziness. ? Weakness. ? Light-headedness. ? Palpitations. ? Thumping or pounding in your chest. ? Shortness of breath. ? Unexplained weakness.  Keep a diary of your activities, such as walking, doing chores, and taking medicine. It is very important to note what you were doing when you pushed the button to record your symptoms. This will help your health care provider determine what might be contributing to your symptoms.  Send the recorded information as recommended by your health care provider. It  may take some time for your health care provider to process the results.  Change the batteries as told by your health care provider.  Keep electronic devices away from your monitor. This includes: ? Tablets. ? MP3 players. ? Cell phones.  While wearing your monitor you should avoid: ? Electric blankets. ? Armed forces operational officer. ? Electric toothbrushes. ? Microwave ovens. ? Magnets. ? Metal detectors. Get help  right away if:  You have chest pain.  You have extreme difficulty breathing or shortness of breath.  You develop a very fast heartbeat that persists.  You develop dizziness that does not go away.  You faint or constantly feel like you are about to faint. Summary  A cardiac event monitor is a small recording device that is used to help detect abnormal heart rhythms (arrhythmias).  The monitor is used to record your heart rhythm when you have heart-related symptoms.  Make sure you understand how to send the information from the monitor to your health care provider.  It is important to press the button on the monitor when you have any heart-related symptoms.  Keep a diary of your activities, such as walking, doing chores, and taking medicine. It is very important to note what you were doing when you pushed the button to record your symptoms. This will help your health care provider learn what might be causing your symptoms. This information is not intended to replace advice given to you by your health care provider. Make sure you discuss any questions you have with your health care provider. Document Released: 04/16/2008 Document Revised: 06/22/2016 Document Reviewed: 06/22/2016 Elsevier Interactive Patient Education  2017 [employer].   If you need a refill on your cardiac medications before your next appointment, please call your pharmacy.

## 2017-10-03 NOTE — Telephone Encounter (Signed)
Pt returned my call and she is aware that she is to stop the Metoprolol.  Pt verbalized understanding.

## 2017-10-17 ENCOUNTER — Ambulatory Visit (INDEPENDENT_AMBULATORY_CARE_PROVIDER_SITE_OTHER): Payer: Medicare Other

## 2017-10-17 DIAGNOSIS — I480 Paroxysmal atrial fibrillation: Secondary | ICD-10-CM | POA: Diagnosis not present

## 2017-10-20 ENCOUNTER — Other Ambulatory Visit: Payer: Medicare Other

## 2017-10-20 ENCOUNTER — Ambulatory Visit: Payer: Medicare Other | Admitting: Hematology

## 2017-10-24 ENCOUNTER — Other Ambulatory Visit: Payer: Self-pay | Admitting: *Deleted

## 2017-10-24 MED ORDER — APIXABAN 5 MG PO TABS: 5.0000 mg | ORAL_TABLET | Freq: Two times a day (BID) | ORAL | 10 refills | Status: DC

## 2017-10-24 NOTE — Telephone Encounter (Signed)
Pt is a 77 yr old female who saw NP in our office on 10/03/17. Wt was 71.3Kg. SCr was 0.85 on 09/25/17. Will refill Eliquis 5mg  BID.

## 2017-10-29 ENCOUNTER — Telehealth: Payer: Self-pay | Admitting: Hematology

## 2017-10-29 NOTE — Telephone Encounter (Signed)
Patient called to reschedule an appointment that was missed °

## 2017-10-31 NOTE — Progress Notes (Signed)
[facility] OFFICE PROGRESS NOTE  CC  Michelle Frees, MD [address] [place] [address] Dr. Thea Silversmith  Dr. Jackolyn Confer  DIAGNOSIS: 77 year old female with new diagnosis of stage I right side invasive ductal carcinoma of the breast. Patient is seen in the multidisciplinary breast clinic  STAGE:  Cancer of upper-outer quadrant of female breast  Primary site: Breast (Right)  Staging method: AJCC 7th Edition  Clinical: (T1b, NX, cM0)  Summary: (T1b, NX, cM0)  PRIOR THERAPY: #1Patient was seen for a six-month recall mammogram for a suspicious right breast nodule. This was at the 9:00 position. Because of this she underwent a repeat diagnostic mammogram performed on 01/05/2013. There was a small nodule that persisted appearing to be most likely irregular and margination. She had an ultrasound that demonstrated a small 3-4 mm nodular density. This appeared suspicious because of this patient underwent a stereotactic biopsy of the 9:00 nodule. The pathology showed an invasive ductal carcinoma with ductal carcinoma in situ. The tumor was grade 2 with associated high-grade DCIS. There was ER positive PR positive HER-2/neu negative with a Ki-67 of 10%. She had MRI of the breasts performed the MRI did not show any abnormal enhancement in the left breast. However in the right breast there was an 8 x 8 x 6 mm enhancing mass in the 9:00 region. There were post biopsy change is noted and biopsy clip was foun  #2 patient has undergone a lumpectomy on 02/01/2013. This revealed a 0.4 cm area of invasive cancer that was grade 1 with associated low-grade DCIS and atypical lobular hyperplasia one sentinel node was negative for metastatic disease. Tumor was ER +100% PR +80%. HER-2/neu negative with a Ki-67 of 50%.   #3 Aromasin 45m daily starting 02/24/2013, changed to tamoxifen in July 2017 due to muscular achiness.   CURRENT THERAPY: Tamoxifen 20 mg once daily,  started in July 2017.  INTERVAL HISTORY: Michelle KHADER89y.o. female returns for follow up visit today of her h/o invasive ductal carcinoma of the breast. She presents to the clinic by herself. She reports she is doing well overall other than an episode of angina in early March. She was referred to cardiology where they did a cardiac catheterization with no significant result. She is currently wearing a heart monitor per cardiology. She does not have any chest pain now. She was placed of cardizem. She is compliant with Tamoxifen with no complaints.   On review of systems, pt denies hot flashes, joint pain or any other complaints at this time. Pertinent positives are listed and detailed within the above HPI.   MEDICAL HISTORY: Past Medical History:  Diagnosis Date  . Allergy    Pcn, Sulfa, Codeine  . Anxiety   . Arthritis   . Atrial fibrillation ([facility] 09/22/2017  . Cataract    surgey b/l  . COPD (chronic obstructive pulmonary disease) ([facility]   . Cough   . Depression    hx years ago  . Hot flashes   . Shortness of breath   . Sjogren's disease ([facility]     ALLERGIES:  is allergic to demerol [meperidine]; penicillins; codeine; and sulfa antibiotics.  MEDICATIONS:  Current Outpatient Medications  Medication Sig Dispense Refill  . apixaban (ELIQUIS) 5 MG TABS tablet Take 1 tablet (5 mg total) by mouth 2 (two) times daily. 60 tablet 10  . azelastine (ASTELIN) 137 MCG/SPRAY nasal spray Place 3 sprays into the nose daily. Use in each nostril  as directed     . beta carotene w/minerals (OCUVITE) tablet Take 1 tablet by mouth daily.    . Carboxymethylcellul-Glycerin (LUBRICATING EYE DROPS OP) Place 1 drop into both eyes daily as needed (Dry Eyes).    Hendricks Limes CONTINUING MONTH PAK 1 MG tablet Take 1 tablet by mouth 2 (two) times daily. see package  0  . CHANTIX STARTING MONTH PAK 0.5 MG X 11 & 1 MG X 42 tablet Take 1 tablet by mouth 2 (two) times daily. see package  0  . cholecalciferol  (VITAMIN D) 1000 units tablet Take 1,000 Units by mouth daily.     Marland Kitchen diltiazem (CARDIZEM CD) 240 MG 24 hr capsule Take 1 capsule (240 mg total) by mouth daily. 90 capsule 3  . Fluticasone-Salmeterol (ADVAIR) 100-50 MCG/DOSE AEPB Inhale 2 puffs into the lungs every morning.    . Misc Natural Products (OSTEO BI-FLEX TRIPLE STRENGTH PO) Take 1 tablet by mouth daily.    . montelukast (SINGULAIR) 10 MG tablet Take 10 mg by mouth at bedtime.     . tamoxifen (NOLVADEX) 20 MG tablet Take 1 tablet (20 mg total) daily by mouth. 90 tablet 3  . vitamin C (ASCORBIC ACID) 500 MG tablet Take 500 mg by mouth daily.      No current facility-administered medications for this visit.     SURGICAL HISTORY:  Past Surgical History:  Procedure Laterality Date  . ABDOMINAL HYSTERECTOMY  74   partial  . BREAST LUMPECTOMY WITH NEEDLE LOCALIZATION AND AXILLARY SENTINEL LYMPH NODE BX Right 02/01/2013   Procedure: BREAST LUMPECTOMY WITH NEEDLE LOCALIZATION AND AXILLARY SENTINEL LYMPH NODE BX;  Surgeon: Odis Hollingshead, MD;  Location: [facility];  Service: General;  Laterality: Right;  . BREAST SURGERY Right 1965   biopsy  . CERVICAL FUSION  2010  . CHOLECYSTECTOMY  2002  . COLONOSCOPY    . DILATION AND CURETTAGE OF UTERUS    . LEFT HEART CATH AND CORONARY ANGIOGRAPHY N/A 09/24/2017   Procedure: LEFT HEART CATH AND CORONARY ANGIOGRAPHY;  Surgeon: Leonie Man, MD;  Location: [facility];  Service: Cardiovascular;  Laterality: N/A;  . ULTRASOUND GUIDANCE FOR VASCULAR ACCESS Right 09/24/2017   Procedure: Ultrasound Guidance For Vascular Access;  Surgeon: Leonie Man, MD;  Location: [facility];  Service: Cardiovascular;  Laterality: Right;    REVIEW OF SYSTEMS:  A 10+ POINT REVIEW OF SYSTEMS WAS OBTAINED including neurology, dermatology, psychiatry, cardiac, respiratory, lymph, extremities, GI, GU, Musculoskeletal, constitutional, breasts, reproductive, HEENT.  All pertinent positives  are noted in the HPI.  All others are negative.  Health Maintenance Mammogram: 02/2017 Colonoscopy:unsure, thinks it was 1-2 years ago Bone Density Scan: 02/2015 Pap Smear: does not have anymore, has had [facility] Eye Exam: 2015 Vitamin D Level: followed by Dr. Kenton Kingfisher, PCP Lipid Panel:  Followed by Dr. Kenton Kingfisher PCP  PHYSICAL EXAMINATION:  BP (!) 149/77 (BP Location: Right Arm, Patient Position: Sitting) Comment: Thu RN is aware  Pulse 95   Temp (!) 97.5 F (36.4 C) (Oral) Comment: Thu RN is aware  Resp 17   Ht 5' 1"  (1.549 m)   Wt 161 lb 14.4 oz (73.4 kg)   SpO2 95%   BMI 30.59 kg/m   Body mass index is 30.59 kg/m. GENERAL: Patient is a well appearing female in no acute distress HEENT:  Sclerae anicteric.  Oropharynx clear and moist. No ulcerations or evidence of oropharyngeal candidiasis. Neck is supple.  NODES:  No  cervical, supraclavicular, or axillary lymphadenopathy palpated.  BREAST EXAM: s/p Lumpectomy without nodularity or abnormality. No suspicious findings on bilateral breast exam. LUNGS:  Clear to auscultation bilaterally.  No wheezes or rhonchi. HEART:  Regular rate and rhythm. No murmur appreciated. ABDOMEN:  Soft, non tender, normoactive bowel sounds. No organomegaly palpated. MSK:  No focal spinal tenderness to palpation. Full range of motion bilaterally in the upper extremities. EXTREMITIES:  No peripheral edema.   SKIN:  Clear with no obvious rashes or skin changes. No nail dyscrasia. NEURO:  Nonfocal. Well oriented.  Appropriate affect. ECOG PERFORMANCE STATUS: 1   LABORATORY DATA: CBC Latest Ref Rng & Units 11/03/2017 09/25/2017 09/24/2017  WBC 3.9 - 10.3 K/uL 8.4 9.8 12.3(H)  Hemoglobin 12.0 - 15.0 g/dL - 13.7 13.4  Hematocrit 34.8 - 46.6 % 40.8 41.6 40.9  Platelets 145 - 400 K/uL 198 209 212    CMP Latest Ref Rng & Units 11/03/2017 09/25/2017 09/23/2017  Glucose 70 - 140 mg/dL 127 104(H) 102(H)  BUN 7 - 26 mg/dL 12 10 9   Creatinine 0.60 - 1.10 mg/dL 0.86 0.85 0.75   Sodium 136 - 145 mmol/L 139 141 141  Potassium 3.5 - 5.1 mmol/L 4.0 4.0 3.7  Chloride 98 - 109 mmol/L 104 107 107  CO2 22 - 29 mmol/L 25 24 23   Calcium 8.4 - 10.4 mg/dL 9.2 8.6(L) 8.1(L)  Total Protein 6.4 - 8.3 g/dL 7.3 - -  Total Bilirubin 0.2 - 1.2 mg/dL 0.4 - -  Alkaline Phos 40 - 150 U/L 66 - -  AST 5 - 34 U/L 17 - -  ALT 0 - 55 U/L 17 - -   RADIOGRAPHIC STUDIES:  Bone density scan 03/06/2015 Normal  Bilateral 3-D mammogram diagnostic 03/12/2017 There is no mammographic evidence of malignancy. Routine mammographic evaluation in 1 year is recommended.  ASSESSMENT: 77 y.o.  female with  1.  Breast cancer of upper outer quadrant of right breast, invasive ductal carcinoma, pT1aN0M0, stage IA, ER positive PR positive HER-2/neu negative. -I previously reviewed her surgical path from her right breast lumpectomy in 2014 -She was started on Aromasin and she took it for 3 years. She switched to Tamoxifen in 2017 due to the diffuse muscular achiness. She is tolerating tamoxifen very well, I recommend her to have it for 5 years total. Plan to complete in July 2022 -She had her diagnostic mammogram in February 2018 at [facility]. Previously seen asymmetry in the right central breast posterior depth on the CC view only is no longer present and NED.  -We'll continue breast cancer surveillance. I encouraged her to do self exam.  -I encouraged her to continue healthy diet, remain to be physically active, and continue exercise. -She is clinically doing well. Labs reviewed, her CBC and CMP are WNL. Her physical exam was unremarkable. She had mammogram at [facility] on 03/12/17 that she states was normal.  There is no clinical concern for recurrence.  -She is due for Mammogram at [facility] in Aug 2019 F/u in 6 months   2. Bone health -She had bone density scan in August 2016 which was normal -I encouraged her to continue taking calcium (she is on 3 times a week) and vitamin D daily, and regular exercise. -DEXA  from 03/12/2017 was [facility] at [facility]  3. Insomnia   -She has had ongoing issues with waking up in the middle of the night and being unable to fall back asleep. I urged her to try taking OTC Melatonin to help with  this. I also previously informed her to decrease her caffeine intake in the evenings, decrease her life stressors, and to rest in the hours leading up to going to bed.  -She will f/u w/ her PCP   4. Chronic medical issues she'll continue follow-up with her primary care physician for other medical problems.   5. Chest pain and Afib  -She went to the ED and was admitted to the hospital on 09/21/17 with complaints of centralized chest pain. Her coronary angiography showed no significant stenosis. -Mali vasc score is 3 - She is now being followed by cardiologist Dr. Acie Fredrickson and was started on cardizem.  -No complaints of chest pain today -I copied my note to him today -Discussed that antiestrogen therapy slightly increased risk of cardiovascular disease.  PLAN:  -continue Tamoxifen  -Mammogram at [facility] in Aug 2019, ordered today. -Copy my note to Dr. Elza Rafter  -Lab and f/u in 6 months, then 1 year following   All questions were answered. The patient knows to call the clinic with any problems, questions or concerns.  I spent 20 minutes counseling the patient face to face. The total time spent in the appointment was 25 minutes and more than 50% was on counseling.  This document serves as a record of services personally performed by Truitt Merle, MD. It was created on her behalf by Theresia Bough, a trained medical scribe. The creation of this record is based on the scribe's personal observations and the provider's statements to them.   I have reviewed the above documentation for accuracy and completeness, and I agree with the above.   Truitt Merle  11/03/2017 4:51 PM

## 2017-11-02 ENCOUNTER — Other Ambulatory Visit: Payer: Self-pay | Admitting: Hematology

## 2017-11-02 DIAGNOSIS — Z170 Estrogen receptor positive status [ER+]: Principal | ICD-10-CM

## 2017-11-02 DIAGNOSIS — C50411 Malignant neoplasm of upper-outer quadrant of right female breast: Secondary | ICD-10-CM

## 2017-11-03 ENCOUNTER — Encounter: Payer: Self-pay | Admitting: Hematology

## 2017-11-03 ENCOUNTER — Inpatient Hospital Stay: Payer: Medicare Other | Attending: Hematology

## 2017-11-03 ENCOUNTER — Inpatient Hospital Stay: Payer: Medicare Other | Admitting: Hematology

## 2017-11-03 ENCOUNTER — Telehealth: Payer: Self-pay | Admitting: Hematology

## 2017-11-03 VITALS — BP 149/77 | HR 95 | Temp 97.5°F | Resp 17 | Ht 61.0 in | Wt 161.9 lb

## 2017-11-03 DIAGNOSIS — C50811 Malignant neoplasm of overlapping sites of right female breast: Secondary | ICD-10-CM | POA: Insufficient documentation

## 2017-11-03 DIAGNOSIS — I4891 Unspecified atrial fibrillation: Secondary | ICD-10-CM | POA: Insufficient documentation

## 2017-11-03 DIAGNOSIS — C50411 Malignant neoplasm of upper-outer quadrant of right female breast: Secondary | ICD-10-CM

## 2017-11-03 DIAGNOSIS — Z170 Estrogen receptor positive status [ER+]: Secondary | ICD-10-CM | POA: Diagnosis not present

## 2017-11-03 DIAGNOSIS — Z79810 Long term (current) use of selective estrogen receptor modulators (SERMs): Secondary | ICD-10-CM | POA: Insufficient documentation

## 2017-11-03 LAB — CMP (CANCER CENTER ONLY)
ALT: 17 U/L (ref 0–55)
AST: 17 U/L (ref 5–34)
Albumin: 3.6 g/dL (ref 3.5–5.0)
Alkaline Phosphatase: 66 U/L (ref 40–150)
Anion gap: 10 (ref 3–11)
BUN: 12 mg/dL (ref 7–26)
CO2: 25 mmol/L (ref 22–29)
Calcium: 9.2 mg/dL (ref 8.4–10.4)
Chloride: 104 mmol/L (ref 98–109)
Creatinine: 0.86 mg/dL (ref 0.60–1.10)
GFR, Est AFR Am: >60 mL/min (ref >60)
GFR, Estimated: >60 mL/min (ref >60)
Glucose, Bld: 127 mg/dL (ref 70–140)
Potassium: 4 mmol/L (ref 3.5–5.1)
Sodium: 139 mmol/L (ref 136–145)
Total Bilirubin: 0.4 mg/dL (ref 0.2–1.2)
Total Protein: 7.3 g/dL (ref 6.4–8.3)

## 2017-11-03 LAB — CBC WITH DIFFERENTIAL (CANCER CENTER ONLY)
Basophils Absolute: 0 10*3/uL (ref 0.0–0.1)
Basophils Relative: 1 %
Eosinophils Absolute: 0.1 10*3/uL (ref 0.0–0.5)
Eosinophils Relative: 1 %
HCT: 40.8 % (ref 34.8–46.6)
Hemoglobin: 13.7 g/dL (ref 11.6–15.9)
Lymphocytes Relative: 24 %
Lymphs Abs: 2.1 10*3/uL (ref 0.9–3.3)
MCH: 31.6 pg (ref 25.1–34.0)
MCHC: 33.6 g/dL (ref 31.5–36.0)
MCV: 94.2 fL (ref 79.5–101.0)
Monocytes Absolute: 0.5 10*3/uL (ref 0.1–0.9)
Monocytes Relative: 6 %
Neutro Abs: 5.7 10*3/uL (ref 1.5–6.5)
Neutrophils Relative %: 68 %
Platelet Count: 198 10*3/uL (ref 145–400)
RBC: 4.33 MIL/uL (ref 3.70–5.45)
RDW: 13 % (ref 11.2–14.5)
WBC Count: 8.4 10*3/uL (ref 3.9–10.3)

## 2017-11-03 NOTE — Telephone Encounter (Signed)
Order faxed to [facility] per 4/15 los

## 2017-11-06 NOTE — Progress Notes (Addendum)
Cardiology Office Note:    Date:  11/07/2017   ID:  Michelle Gonzales, DOB 08/06/1940, MRN [id number]  PCP:  [name], MD  Cardiologist:  Mertie Moores, MD   Referring MD: [name], MD   Chief Complaint  Patient presents with  . Follow-up    afib    History of Present Illness:    Michelle Gonzales is a 77 y.o. female with a past medical history significant for right breast cancer (stage I), tobacco abuse and recently paroxysmal atrial fibrillation on Eliquis.  The patient was admitted to the hospital on 09/21/17 with complaints of chest pain and was found to be in atrial fibrillation with rapid ventricular response.  She ruled out for MI but a nuclear stress test was abnormal so she underwent cardiac catheterization on 09/24/17 that showed minimal CAD.  She was discharged on diltiazem and metoprolol and in sinus rhythm.  Michelle Gonzales seen in the office by me on 3/15 at which time she was complaining of occasional central chest pressure lasting for a couple of hours, unrelated to activity and without dyspnea.  This was similar to her presenting chest pain at the hospital when she was in A. fib with RVR but not as severe.  She was also complaining of significant activity limiting fatigue.  I felt that she may have been having some breakthrough A. fib causing her chest discomfort.  I increased her Cardizem for better rate control and stopped her metoprolol due to the fatigue.  I also ordered an event monitor to evaluate her A. fib burden relationship to her symptoms.   Today Michelle Gonzales is here with her husband. She is feeling much better. Her energy has returned since stopping the metoprolol. She has also had no chest pressure since she put the monitor on and increase of diltiazem. No shortness of breath, orthopnea, palpitations. She gets some dizziness about once a day when she first rises after being seated or laying down. She has not gotten back to her usual exercise with Silver Sneakers 3  times per week.  She is still wearing the event monitor. So far is has shown sinus rhythm, no afib.   Past Medical History:  Diagnosis Date  . Allergy    Pcn, Sulfa, Codeine  . Anxiety   . Arthritis   . Atrial fibrillation ([place]) 09/22/2017  . Cataract    surgey b/l  . COPD (chronic obstructive pulmonary disease) ([place])   . Cough   . Depression    hx years ago  . Hot flashes   . Shortness of breath   . Sjogren's disease [facility])     Past Surgical History:  Procedure Laterality Date  . ABDOMINAL HYSTERECTOMY  74   partial  . BREAST LUMPECTOMY WITH NEEDLE LOCALIZATION AND AXILLARY SENTINEL LYMPH NODE BX Right 02/01/2013   Procedure: BREAST LUMPECTOMY WITH NEEDLE LOCALIZATION AND AXILLARY SENTINEL LYMPH NODE BX;  Surgeon: Odis Hollingshead, MD;  Location: [facility];  Service: General;  Laterality: Right;  . BREAST SURGERY Right 1965   biopsy  . CERVICAL FUSION  2010  . CHOLECYSTECTOMY  2002  . COLONOSCOPY    . DILATION AND CURETTAGE OF UTERUS    . LEFT HEART CATH AND CORONARY ANGIOGRAPHY N/A 09/24/2017   Procedure: LEFT HEART CATH AND CORONARY ANGIOGRAPHY;  Surgeon: Leonie Man, MD;  Location: [facility];  Service: Cardiovascular;  Laterality: N/A;  . ULTRASOUND GUIDANCE FOR VASCULAR ACCESS Right 09/24/2017   Procedure:  Ultrasound Guidance For Vascular Access;  Surgeon: Leonie Man, MD;  Location: [facility];  Service: Cardiovascular;  Laterality: Right;    Current Medications: Current Meds  Medication Sig  . apixaban (ELIQUIS) 5 MG TABS tablet Take 1 tablet (5 mg total) by mouth 2 (two) times daily.  Marland Kitchen azelastine (ASTELIN) 137 MCG/SPRAY nasal spray Place 3 sprays into the nose daily. Use in each nostril as directed   . beta carotene w/minerals (OCUVITE) tablet Take 1 tablet by mouth daily.  . Carboxymethylcellul-Glycerin (LUBRICATING EYE DROPS OP) Place 1 drop into both eyes daily as needed (Dry Eyes).  Hendricks Limes CONTINUING MONTH PAK 1 MG  tablet Take 1 tablet by mouth 2 (two) times daily. see package  . cholecalciferol (VITAMIN D) 1000 units tablet Take 1,000 Units by mouth daily.   Marland Kitchen diltiazem (CARDIZEM CD) 240 MG 24 hr capsule Take 1 capsule (240 mg total) by mouth daily.  . Fluticasone-Salmeterol (ADVAIR) 100-50 MCG/DOSE AEPB Inhale 2 puffs into the lungs every morning.  . Misc Natural Products (OSTEO BI-FLEX TRIPLE STRENGTH PO) Take 1 tablet by mouth daily.  . montelukast (SINGULAIR) 10 MG tablet Take 10 mg by mouth at bedtime.   . tamoxifen (NOLVADEX) 20 MG tablet Take 1 tablet (20 mg total) daily by mouth.  . vitamin C (ASCORBIC ACID) 500 MG tablet Take 500 mg by mouth daily.   . [DISCONTINUED] CHANTIX STARTING MONTH PAK 0.5 MG X 11 & 1 MG X 42 tablet Take 1 tablet by mouth 2 (two) times daily. see package     Allergies:   Demerol [meperidine]; Penicillins; Codeine; and Sulfa antibiotics   Social History   Socioeconomic History  . Marital status: Married    Spouse name: Not on file  . Number of children: Not on file  . Years of education: Not on file  . Highest education level: Not on file  Occupational History  . Not on file  Social Needs  . Financial resource strain: Not on file  . Food insecurity:    Worry: Not on file    Inability: Not on file  . Transportation needs:    Medical: Not on file    Non-medical: Not on file  Tobacco Use  . Smoking status: Former Smoker    Packs/day: 0.50    Types: Cigarettes    Last attempt to quit: 10/07/2017    Years since quitting: 0.0  . Smokeless tobacco: Never Used  Substance and Sexual Activity  . Alcohol use: Yes    Comment: rare  . Drug use: No  . Sexual activity: Yes  Lifestyle  . Physical activity:    Days per week: Not on file    Minutes per session: Not on file  . Stress: Not on file  Relationships  . Social connections:    Talks on phone: Not on file    Gets together: Not on file    Attends religious service: Not on file    Active member of club  or organization: Not on file    Attends meetings of clubs or organizations: Not on file    Relationship status: Not on file  Other Topics Concern  . Not on file  Social History Narrative  . Not on file     Family History: The patient's family history includes Breast cancer in her mother; Cancer in her mother. ROS:   Please see the history of present illness.     All other systems reviewed and are negative.  EKGs/Labs/Other Studies Reviewed:    The following studies were reviewed today:  Echocardiogram 09/22/2017: Study Conclusions - Left ventricle: The cavity size was normal. There was mild concentric hypertrophy. Systolic function was normal. The estimated ejection fraction was in the range of 55% to 60%. Wall motion was normal; there were no regional wall motion abnormalities. - Aortic valve: There was mild regurgitation. - Mitral valve: Mild focal calcification of the anterior leaflet (medial segment(s)). There was trivial regurgitation. - Pericardium, extracardiac: A trivial, free-flowing pericardial effusion was identified along the right ventricular free wall. The fluid had no internal echoes.  NST 09/23/2017:  There was no ST segment deviation noted during stress.  No T wave inversion was noted during stress.  Defect 1: There is a large defect of mild severity present in the basal inferior, basal inferolateral, mid inferior, mid inferolateral, apical septal, apical inferior and apical lateral location.  Findings consistent with ischemia.  This is a high risk study.  The left ventricular ejection fraction is mildly decreased (45-54%).  The study is affected by artifacts however there appears to be an ischemia in the basal and mid inferior and inferolateral walls and in the septal, inferior and lateral walls.  Overall LVEF is decreased calculated at 51%.  Cardiac Catheterization 09/24/2017: Conclusion    Prox LAD to Mid LAD lesion is 20%  stenosed. Prox RCA lesion is 20% stenosed. -Otherwise essentially normal coronary arteries with no culprit lesion to explain abnormal stress test.  The left ventricular systolic function is normal. The left ventricular ejection fraction is 55-65% by visual estimate.  LV end diastolic pressure is normal.  There is mild (2+) mitral regurgitation.  Angiographically minimal coronary arteries. Normal LV function. Normal LVEDP. Suspect that chest pain is related to atrial fibrillation as opposed to ischemic CAD related angina.  Plan: Return to nursing unit for ongoing care. We will start Eliquis 5 mg twice daily starting tonight. Continue treatment of A. Fib -consider possible cardioversion either staged as an outpatient or inpatient TEE.   EKG:  EKG is not ordered today.    Recent Labs: 09/22/2017: TSH 1.202 09/25/2017: Hemoglobin 13.7 11/03/2017: ALT 17; BUN 12; Creatinine 0.86; Platelet Count 198; Potassium 4.0; Sodium 139   Recent Lipid Panel    Component Value Date/Time   CHOL 171 09/21/2017 1001   TRIG 117 09/21/2017 1001   HDL 53 09/21/2017 1001   CHOLHDL 3.2 09/21/2017 1001   VLDL 23 09/21/2017 1001   LDLCALC 95 09/21/2017 1001    Physical Exam:    VS:  BP 138/86 (BP Location: Left Arm, Patient Position: Sitting, Cuff Size: Normal)   Pulse 92   Ht 5' (1.524 m)   Wt 157 lb 1.9 oz (71.3 kg)   SpO2 95% Comment: at rest  BMI 30.69 kg/m     Wt Readings from Last 3 Encounters:  11/07/17 157 lb 1.9 oz (71.3 kg)  11/03/17 161 lb 14.4 oz (73.4 kg)  10/03/17 157 lb 1.9 oz (71.3 kg)     Physical Exam  Constitutional: She appears well-developed and well-nourished. No distress.  HENT:  Head: Normocephalic and atraumatic.  Neck: Normal range of motion. Neck supple. No JVD present.  Cardiovascular: Normal rate, regular rhythm, normal heart sounds and intact distal pulses. Exam reveals no gallop and no friction rub.  No murmur heard. Pulmonary/Chest: Effort normal and  breath sounds normal. No respiratory distress. She has no wheezes. She has no rales.  Abdominal: Soft. Bowel sounds are normal. She exhibits no  distension. There is no tenderness.  Musculoskeletal: Normal range of motion. She exhibits no edema.  Neurological: She is alert.  Skin: Skin is warm and dry.  Psychiatric: She has a normal mood and affect. Her behavior is normal. Thought content normal.    ASSESSMENT:    1. Paroxysmal atrial fibrillation (HCC)   2. Tobacco abuse    PLAN:    In order of problems listed above:  Paroxysmal atrial fibrillation: Recently new onset 09/21/2017. At follow up she was still having intermittent chest discomfort similar to what she had with the afib with RVR. She also had extreme fatigue. Diltiazem was increased to 240 mg daily for better rate control. BB was discontinued due to fatigue. Feels much better with no further complaints. Event monitor shows sinus rhythm. Continue current therapy. Pt encouraged to resume her usual exercise routine, starting slowly and progressing.   CHA2DS2/VAS Stroke Risk Score is 3 (Age (3), female). Anticoagulated with Eliquis 5 mg bid for stroke risk reduction. No unusual bleeding.   Tobacco abuse: On Chantix and has quit now for a month. Pt congratulated on her accomplishment.  Will follow up with Dr. Acie Fredrickson in 6 months or sooner if needed.   Medication Adjustments/Labs and Tests Ordered: Current medicines are reviewed at length with the patient today.  Concerns regarding medicines are outlined above. Labs and tests ordered and medication changes are outlined in the patient instructions below:  There are no Patient Instructions on file for this visit.   Signed, Daune Perch, NP  11/07/2017 9:36 AM    [place]

## 2017-11-07 ENCOUNTER — Ambulatory Visit: Payer: Medicare Other | Admitting: Cardiology

## 2017-11-07 ENCOUNTER — Encounter: Payer: Self-pay | Admitting: Cardiology

## 2017-11-07 ENCOUNTER — Encounter (INDEPENDENT_AMBULATORY_CARE_PROVIDER_SITE_OTHER): Payer: Self-pay

## 2017-11-07 VITALS — BP 138/86 | HR 92 | Ht 60.0 in | Wt 157.1 lb

## 2017-11-07 DIAGNOSIS — Z720 Tobacco use: Secondary | ICD-10-CM

## 2017-11-07 DIAGNOSIS — I480 Paroxysmal atrial fibrillation: Secondary | ICD-10-CM

## 2017-11-07 NOTE — Patient Instructions (Signed)
Medication Instructions:  Your physician recommends that you continue on your current medications as directed. Please refer to the Current Medication list given to you today.   Labwork: -None  Testing/Procedures: -None  Follow-Up: Your physician wants you to follow-up in: 6 month with Dr. Acie Fredrickson.  You will receive a reminder letter in the mail two months in advance. If you don't receive a letter, please call our office to schedule the follow-up appointment.   Any Other Special Instructions Will Be Listed Below (If Applicable).     If you need a refill on your cardiac medications before your next appointment, please call your pharmacy.

## 2017-11-25 ENCOUNTER — Telehealth: Payer: Self-pay | Admitting: Cardiology

## 2017-11-25 NOTE — Telephone Encounter (Signed)
Pt informed of normal test results; no further questions.

## 2017-11-25 NOTE — Telephone Encounter (Signed)
New message   Pt verbalized that she is calling to have   Her results of her monitor

## 2018-01-20 ENCOUNTER — Telehealth: Payer: Self-pay

## 2018-01-20 NOTE — Telephone Encounter (Signed)
Patient had LVM regarding needing to speak to someone about her medications.  Attempted to call patient x 4 - no answer and no voicemail to be able to leave a message.

## 2018-01-20 NOTE — Telephone Encounter (Signed)
Patient calls stating that she thinks the Tomoxifen is making her depressed.  She is requesting to be switched to different medication.  Her [phone]

## 2018-01-20 NOTE — Telephone Encounter (Signed)
Left voice message for patient to call back and leave a message regarding what medications she needs or what problems she is having.

## 2018-01-21 ENCOUNTER — Telehealth: Payer: Self-pay

## 2018-01-21 NOTE — Telephone Encounter (Signed)
Called patient per Dr. Burr Medico stop Tamoxifen for now will make her a f/u appointment for 1 to 2 months to see if she is feeling better.  Instructed her to call back if her depression worsens.  She verbalized an understanding.

## 2018-01-23 ENCOUNTER — Telehealth: Payer: Self-pay | Admitting: Hematology

## 2018-01-23 NOTE — Telephone Encounter (Signed)
LMVM for patient regarding appointment per 7/3 sch msg

## 2018-03-24 ENCOUNTER — Telehealth: Payer: Self-pay | Admitting: Hematology

## 2018-03-24 NOTE — Telephone Encounter (Signed)
Called patient regarding 9/2 sch msg she was ok

## 2018-03-26 ENCOUNTER — Telehealth: Payer: Self-pay | Admitting: Cardiovascular Disease

## 2018-03-26 NOTE — Telephone Encounter (Signed)
OK to hold Eliquis for 2 days for dental extraciton

## 2018-03-26 NOTE — Telephone Encounter (Signed)
New Message         Patient is having a tooth pulled on Monday 03/30/2018. Patient has to stop the Eliquis 5 mg on Saturday morning she can resume on Monday night. 2 day stop. Patient wants to make sure this is ok

## 2018-03-26 NOTE — Telephone Encounter (Signed)
Advised patient that per Dr. Acie Fredrickson, she may hold Eliquis for 2 days prior to dental extraction. She verbalized understanding and thanked me for the call.

## 2018-03-27 ENCOUNTER — Ambulatory Visit: Payer: Medicare Other | Admitting: Hematology

## 2018-03-27 ENCOUNTER — Inpatient Hospital Stay: Payer: Medicare Other | Attending: Nurse Practitioner

## 2018-03-27 ENCOUNTER — Telehealth: Payer: Self-pay

## 2018-03-27 ENCOUNTER — Inpatient Hospital Stay (HOSPITAL_BASED_OUTPATIENT_CLINIC_OR_DEPARTMENT_OTHER): Payer: Medicare Other | Admitting: Nurse Practitioner

## 2018-03-27 ENCOUNTER — Encounter: Payer: Self-pay | Admitting: Nurse Practitioner

## 2018-03-27 VITALS — BP 131/82 | HR 100 | Temp 98.2°F | Resp 18 | Ht 60.0 in | Wt 158.9 lb

## 2018-03-27 DIAGNOSIS — Z170 Estrogen receptor positive status [ER+]: Secondary | ICD-10-CM | POA: Diagnosis not present

## 2018-03-27 DIAGNOSIS — C50411 Malignant neoplasm of upper-outer quadrant of right female breast: Secondary | ICD-10-CM

## 2018-03-27 LAB — CMP (CANCER CENTER ONLY)
ALT: 9 U/L (ref 0–44)
AST: 16 U/L (ref 15–41)
Albumin: 3.5 g/dL (ref 3.5–5.0)
Alkaline Phosphatase: 71 U/L (ref 38–126)
Anion gap: 9 (ref 5–15)
BUN: 13 mg/dL (ref 8–23)
CO2: 27 mmol/L (ref 22–32)
Calcium: 9.2 mg/dL (ref 8.9–10.3)
Chloride: 102 mmol/L (ref 98–111)
Creatinine: 0.98 mg/dL (ref 0.44–1.00)
GFR, Est AFR Am: >60 mL/min (ref >60)
GFR, Estimated: 55 mL/min — ABNORMAL LOW (ref >60)
Glucose, Bld: 132 mg/dL — ABNORMAL HIGH (ref 70–99)
Potassium: 4.2 mmol/L (ref 3.5–5.1)
Sodium: 138 mmol/L (ref 135–145)
Total Bilirubin: 0.4 mg/dL (ref 0.3–1.2)
Total Protein: 7.1 g/dL (ref 6.5–8.1)

## 2018-03-27 LAB — CBC WITH DIFFERENTIAL (CANCER CENTER ONLY)
Basophils Absolute: 0.1 10*3/uL (ref 0.0–0.1)
Basophils Relative: 1 %
Eosinophils Absolute: 0.2 10*3/uL (ref 0.0–0.5)
Eosinophils Relative: 2 %
HCT: 40.2 % (ref 34.8–46.6)
Hemoglobin: 13.3 g/dL (ref 11.6–15.9)
Lymphocytes Relative: 30 %
Lymphs Abs: 2.5 10*3/uL (ref 0.9–3.3)
MCH: 30.4 pg (ref 25.1–34.0)
MCHC: 33.2 g/dL (ref 31.5–36.0)
MCV: 91.6 fL (ref 79.5–101.0)
Monocytes Absolute: 0.4 10*3/uL (ref 0.1–0.9)
Monocytes Relative: 5 %
Neutro Abs: 5.1 10*3/uL (ref 1.5–6.5)
Neutrophils Relative %: 62 %
Platelet Count: 199 10*3/uL (ref 145–400)
RBC: 4.39 MIL/uL (ref 3.70–5.45)
RDW: 13 % (ref 11.2–14.5)
WBC Count: 8.2 10*3/uL (ref 3.9–10.3)

## 2018-03-27 NOTE — Progress Notes (Signed)
[place]  [phone] [phone]  Clinic Follow up Note   Patient Care Team: [name], MD as PCP - General (Family Medicine) [name], [name], MD as PCP - Cardiology (Cardiology) 03/27/2018  DIAGNOSIS: 77 year old female with new diagnosis of stage I right side invasive ductal carcinoma of the breast.   STAGE:  Cancer of upper-outer quadrant of female breast  Primary site: Breast (Right)  Staging method: AJCC 7th Edition  Clinical: (T1b, NX, cM0)  Summary: (T1b, NX, cM0)  PRIOR THERAPY: #1Patient was seen for a six-month recall mammogram for a suspicious right breast nodule. This was at the 9:00 position. Because of this she underwent a repeat diagnostic mammogram performed on 01/05/2013. There was a small nodule that persisted appearing to be most likely irregular and margination. She had an ultrasound that demonstrated a small 3-4 mm nodular density. This appeared suspicious because of this patient underwent a stereotactic biopsy of the 9:00 nodule. The pathology showed an invasive ductal carcinoma with ductal carcinoma in situ. The tumor was grade 2 with associated high-grade DCIS. There was ER positive PR positive HER-2/neu negative with a Ki-67 of 10%. She had MRI of the breasts performed the MRI did not show any abnormal enhancement in the left breast. However in the right breast there was an 8 x 8 x 6 mm enhancing mass in the 9:00 region. There were post biopsy change is noted and biopsy clip was foun  #2 patient has undergone a lumpectomy on 02/01/2013. This revealed a 0.4 cm area of invasive cancer that was grade 1 with associated low-grade DCIS and atypical lobular hyperplasia one sentinel node was negative for metastatic disease. Tumor was ER +100% PR +80%. HER-2/neu negative with a Ki-67 of 50%.   #3 Aromasin 87m daily starting 02/24/2013, changed to tamoxifen in July 2017 due to muscular achiness.  CURRENT THERAPY: Tamoxifen 20  mg once daily, started in July 2017. Stopped 01/21/18 due to severe depression  INTERVAL HISTORY: Ms. BJordahlreturns for follow up as scheduled. She called in 01/2018 to report depression which she considered severe, crying every day, and was told she could hold tamoxifen. Today she feels well. Her depression resolved 1-2 weeks after stopping tamoxifen. She feels well, remains active with good appetite. Has occasional hot flash. Denies n/v/c/d. No vaginal bleeding or dryness. Has occasional leg and feet cramps. Denies neuropathy, fever, chills, cough, chest pain, dyspnea, leg swelling, or changes in her breast.    MEDICAL HISTORY:  Past Medical History:  Diagnosis Date  . Allergy    Pcn, Sulfa, Codeine  . Anxiety   . Arthritis   . Atrial fibrillation ([facility] 09/22/2017  . Cataract    surgey b/l  . COPD (chronic obstructive pulmonary disease) ([facility]   . Cough   . Depression    hx years ago  . Hot flashes   . Shortness of breath   . Sjogren's disease ([facility]     SURGICAL HISTORY: Past Surgical History:  Procedure Laterality Date  . ABDOMINAL HYSTERECTOMY  74   partial  . BREAST LUMPECTOMY WITH NEEDLE LOCALIZATION AND AXILLARY SENTINEL LYMPH NODE BX Right 02/01/2013   Procedure: BREAST LUMPECTOMY WITH NEEDLE LOCALIZATION AND AXILLARY SENTINEL LYMPH NODE BX;  Surgeon: TOdis Hollingshead MD;  [facility]  Service: General;  Laterality: Right;  . BREAST SURGERY Right 1965   biopsy  . CERVICAL FUSION  2010  . CHOLECYSTECTOMY  2002  . COLONOSCOPY    . DILATION  AND CURETTAGE OF UTERUS    . LEFT HEART CATH AND CORONARY ANGIOGRAPHY N/A 09/24/2017   Procedure: LEFT HEART CATH AND CORONARY ANGIOGRAPHY;  Surgeon: Leonie Man, MD;  Location: [place] [facility];  Service: Cardiovascular;  Laterality: N/A;  . ULTRASOUND GUIDANCE FOR VASCULAR ACCESS Right 09/24/2017   Procedure: Ultrasound Guidance For Vascular Access;  Surgeon: Leonie Man, MD;  Location: [place] [facility]  [facility];  Service: Cardiovascular;  Laterality: Right;    I have reviewed the social history and family history with the patient and they are unchanged from previous note.  ALLERGIES:  is allergic to demerol [meperidine]; penicillins; codeine; and sulfa antibiotics.  MEDICATIONS:  Current Outpatient Medications  Medication Sig Dispense Refill  . apixaban (ELIQUIS) 5 MG TABS tablet Take 1 tablet (5 mg total) by mouth 2 (two) times daily. 60 tablet 10  . azelastine (ASTELIN) 137 MCG/SPRAY nasal spray Place 3 sprays into the nose daily. Use in each nostril as directed     . beta carotene w/minerals (OCUVITE) tablet Take 1 tablet by mouth daily.    . Carboxymethylcellul-Glycerin (LUBRICATING EYE DROPS OP) Place 1 drop into both eyes daily as needed (Dry Eyes).    Hendricks Limes CONTINUING MONTH PAK 1 MG tablet Take 1 tablet by mouth 2 (two) times daily. see package  0  . cholecalciferol (VITAMIN D) 1000 units tablet Take 1,000 Units by mouth daily.     . Fluticasone-Salmeterol (ADVAIR) 100-50 MCG/DOSE AEPB Inhale 2 puffs into the lungs every morning.    . Misc Natural Products (OSTEO BI-FLEX TRIPLE STRENGTH PO) Take 1 tablet by mouth daily.    . montelukast (SINGULAIR) 10 MG tablet Take 10 mg by mouth at bedtime.     . vitamin C (ASCORBIC ACID) 500 MG tablet Take 500 mg by mouth daily.     Marland Kitchen diltiazem (CARDIZEM CD) 240 MG 24 hr capsule Take 1 capsule (240 mg total) by mouth daily. 90 capsule 3  . tamoxifen (NOLVADEX) 20 MG tablet Take 1 tablet (20 mg total) daily by mouth. (Patient not taking: Reported on 03/27/2018) 90 tablet 3   No current facility-administered medications for this visit.     PHYSICAL EXAMINATION: ECOG PERFORMANCE STATUS: 0 - Asymptomatic  Vitals:   03/27/18 1427  BP: 131/82  Pulse: 100  Resp: 18  Temp: 98.2 F (36.8 C)  SpO2: 97%   Filed Weights   03/27/18 1427  Weight: 158 lb 14.4 oz (72.1 kg)    GENERAL:alert, no distress and comfortable SKIN:  no rashes or  significant lesions EYES: sclera clear OROPHARYNX:no thrush or ulcers  LYMPH:  no palpable cervical or supraclavicular, or axillary lymphadenopathy  LUNGS: clear to auscultation with normal breathing effort HEART: regular rate & rhythm and no murmurs and no lower extremity edema ABDOMEN:abdomen soft, non-tender and normal bowel sounds Musculoskeletal:no cyanosis of digits and no clubbing  NEURO: alert & oriented x 3 with fluent speech, no focal motor/sensory deficits Breast exam: exam reveals s/p right lumpectomy, surgical incision is well healed. No palpable mass in either breast or axilla that I could appreciate   LABORATORY DATA:  I have reviewed the data as listed CBC Latest Ref Rng & Units 03/27/2018 11/03/2017 09/25/2017  WBC 3.9 - 10.3 K/uL 8.2 8.4 9.8  Hemoglobin 11.6 - 15.9 g/dL 13.3 13.7 13.7  Hematocrit 34.8 - 46.6 % 40.2 40.8 41.6  Platelets 145 - 400 K/uL 199 198 209     CMP Latest Ref Rng & Units 03/27/2018  11/03/2017 09/25/2017  Glucose 70 - 99 mg/dL 132(H) 127 104(H)  BUN 8 - 23 mg/dL 13 12 10   Creatinine 0.44 - 1.00 mg/dL 0.98 0.86 0.85  Sodium 135 - 145 mmol/L 138 139 141  Potassium 3.5 - 5.1 mmol/L 4.2 4.0 4.0  Chloride 98 - 111 mmol/L 102 104 107  CO2 22 - 32 mmol/L 27 25 24   Calcium 8.9 - 10.3 mg/dL 9.2 9.2 8.6(L)  Total Protein 6.5 - 8.1 g/dL 7.1 7.3 -  Total Bilirubin 0.3 - 1.2 mg/dL 0.4 0.4 -  Alkaline Phos 38 - 126 U/L 71 66 -  AST 15 - 41 U/L 16 17 -  ALT 0 - 44 U/L 9 17 -      RADIOGRAPHIC STUDIES: I have personally reviewed the radiological images as listed and agreed with the findings in the report. No results found.   ASSESSMENT & PLAN: 77 y.o.  female with  1.  Breast cancer of upper outer quadrant of right breast, invasive ductal carcinoma, pT1aN0M0, stage IA, ER positive PR positive HER-2/neu negative. 2. Bone health - DEXA 02/2017 [facility] at [facility]  3. Insomnia 4. Chronic medical issues  5. H/o chest pain and Afib  6. Depression, related to  tamoxifen   Ms. Goates is clinically doing well. Her depression resolved after stopping tamoxifen in 01/2018. She had myalgias with Aromasin. She is not interested in restarting AI with antidepressant or other drug in this class. She was on adjuvant anti-estrogen therapy for a total of 5 years following lumpectomy in 2014. Her physical exam is unremarkable. Labs stable. Mammogram in 02/2018 shows 0.8 cm cluster of calcifications, otherwise negative for malignancy. No clinical concern for recurrence. She will get repeat mammogram in 6 months at [facility], I placed order today. Next DEXA due in 2020. She will return for surveillance f/u in 1 year.   I recommend she continue f/u with PCP for routine health maintenance in the meantime. She will call if she notices any changes in her breasts or health in general.   PLAN: -Labs, mammogram reviewed -remains off tamoxifen, no plan to restart anti-estrogen therapy  -6 month mammo at [facility] in 08/2018, ordered today  -DEXA due 2020 -Lab, f/u with Dr. Burr Medico in 1 year    Orders Placed This Encounter  Procedures  . MM DIAG BREAST TOMO BILATERAL    [facility]    Standing Status:   Future    Standing Expiration Date:   03/28/2019    Order Specific Question:   Reason for Exam (SYMPTOM  OR DIAGNOSIS REQUIRED)    Answer:   h/o right breast cancer 2014 s/p lumpectomy and AI until 2019. known calcifications on 02/2018 mammo    Order Specific Question:   Preferred imaging location?    Answer:   External   All questions were answered. The patient knows to call the clinic with any problems, questions or concerns. No barriers to learning was detected.     Alla Feeling, NP 03/27/18

## 2018-03-27 NOTE — Telephone Encounter (Signed)
Printed avs and calender of upcoming appointment. Per 9/6 los 

## 2018-04-30 ENCOUNTER — Encounter: Payer: Self-pay | Admitting: *Deleted

## 2018-05-06 ENCOUNTER — Inpatient Hospital Stay: Payer: Medicare Other | Admitting: Hematology

## 2018-05-06 ENCOUNTER — Inpatient Hospital Stay: Payer: Medicare Other

## 2018-05-14 ENCOUNTER — Ambulatory Visit: Payer: Medicare Other | Admitting: Cardiovascular Disease

## 2018-05-14 ENCOUNTER — Encounter: Payer: Self-pay | Admitting: Cardiovascular Disease

## 2018-05-14 VITALS — BP 132/76 | HR 89 | Ht 60.0 in | Wt 155.6 lb

## 2018-05-14 DIAGNOSIS — Z13220 Encounter for screening for lipoid disorders: Secondary | ICD-10-CM | POA: Diagnosis not present

## 2018-05-14 DIAGNOSIS — I480 Paroxysmal atrial fibrillation: Secondary | ICD-10-CM | POA: Diagnosis not present

## 2018-05-14 MED ORDER — DILTIAZEM HCL ER COATED BEADS 180 MG PO CP24: 180.0000 mg | ORAL_CAPSULE | Freq: Every day | ORAL | 3 refills | Status: DC

## 2018-05-14 NOTE — Progress Notes (Signed)
Cardiology Office Note:    Date:  05/14/2018   ID:  Michelle Gonzales, DOB 10-04-1940, MRN [id number]  PCP:  [name], MD  Cardiologist:  Mertie Moores, MD  Electrophysiologist:  None   Referring MD: [name], MD   Problem list 1.  Atrial fibrillation -paroxysmal 2.  Breast cancer 3.  History of chest pain-heart catheterization in March, 2019 showed minimal CAD    Chief Complaint  Patient presents with  . Atrial Fibrillation     Michelle Gonzales is a 76 y.o. female with a hx of atrial fibrillation.  She has been seen by Pecolia Ades, nurse practitioner.  I met her in the hospital in March, 2019. Has started back smoking  ( her husband continued to smoke inside the house )   Had PAF on the event monitor She cannot tell when she is in A. fib versus sinus rhythm.  She denies any chest pain, shortness of breath, or passing out.  Has not been exercising .    Past Medical History:  Diagnosis Date  . Allergy    Pcn, Sulfa, Codeine  . Anxiety   . Arthritis   . Atrial fibrillation ([place]) 09/22/2017  . Cataract    surgey b/l  . COPD (chronic obstructive pulmonary disease) ([place])   . Cough   . Depression    hx years ago  . Hot flashes   . Shortness of breath   . Sjogren's disease [facility])     Past Surgical History:  Procedure Laterality Date  . ABDOMINAL HYSTERECTOMY  74   partial  . BREAST LUMPECTOMY WITH NEEDLE LOCALIZATION AND AXILLARY SENTINEL LYMPH NODE BX Right 02/01/2013   Procedure: BREAST LUMPECTOMY WITH NEEDLE LOCALIZATION AND AXILLARY SENTINEL LYMPH NODE BX;  Surgeon: Odis Hollingshead, MD;  Location: [place];  Service: General;  Laterality: Right;  . BREAST SURGERY Right 1965   biopsy  . CERVICAL FUSION  2010  . CHOLECYSTECTOMY  2002  . COLONOSCOPY    . DILATION AND CURETTAGE OF UTERUS    . LEFT HEART CATH AND CORONARY ANGIOGRAPHY N/A 09/24/2017   Procedure: LEFT HEART CATH AND CORONARY ANGIOGRAPHY;  Surgeon: Leonie Man, MD;   Location: [place] [facility] LAB;  Service: Cardiovascular;  Laterality: N/A;  . ULTRASOUND GUIDANCE FOR VASCULAR ACCESS Right 09/24/2017   Procedure: Ultrasound Guidance For Vascular Access;  Surgeon: Leonie Man, MD;  Location: [place] [facility];  Service: Cardiovascular;  Laterality: Right;    Current Medications: Current Meds  Medication Sig  . apixaban (ELIQUIS) 5 MG TABS tablet Take 1 tablet (5 mg total) by mouth 2 (two) times daily.  Marland Kitchen azelastine (ASTELIN) 137 MCG/SPRAY nasal spray Place 3 sprays into the nose daily. Use in each nostril as directed   . beta carotene w/minerals (OCUVITE) tablet Take 1 tablet by mouth daily.  . Carboxymethylcellul-Glycerin (LUBRICATING EYE DROPS OP) Place 1 drop into both eyes daily as needed (Dry Eyes).  . cholecalciferol (VITAMIN D) 1000 units tablet Take 1,000 Units by mouth daily.   . Fluticasone-Salmeterol (ADVAIR) 100-50 MCG/DOSE AEPB Inhale 2 puffs into the lungs every morning.  . Misc Natural Products (OSTEO BI-FLEX TRIPLE STRENGTH PO) Take 1 tablet by mouth daily.  . montelukast (SINGULAIR) 10 MG tablet Take 10 mg by mouth at bedtime.   . vitamin C (ASCORBIC ACID) 500 MG tablet Take 500 mg by mouth daily.      Allergies:   Demerol [meperidine]; Penicillins; Codeine; and Sulfa antibiotics  Social History   Socioeconomic History  . Marital status: Married    Spouse name: Not on file  . Number of children: Not on file  . Years of education: Not on file  . Highest education level: Not on file  Occupational History  . Not on file  Social Needs  . Financial resource strain: Not on file  . Food insecurity:    Worry: Not on file    Inability: Not on file  . Transportation needs:    Medical: Not on file    Non-medical: Not on file  Tobacco Use  . Smoking status: Former Smoker    Packs/day: 0.50    Types: Cigarettes    Last attempt to quit: 10/07/2017    Years since quitting: 0.6  . Smokeless tobacco: Never Used  Substance and Sexual  Activity  . Alcohol use: Yes    Comment: rare  . Drug use: No  . Sexual activity: Yes  Lifestyle  . Physical activity:    Days per week: Not on file    Minutes per session: Not on file  . Stress: Not on file  Relationships  . Social connections:    Talks on phone: Not on file    Gets together: Not on file    Attends religious service: Not on file    Active member of club or organization: Not on file    Attends meetings of clubs or organizations: Not on file    Relationship status: Not on file  Other Topics Concern  . Not on file  Social History Narrative  . Not on file     Family History: The patient's family history includes Breast cancer in her mother.  ROS:   Please see the history of present illness.     All other systems reviewed and are negative.  EKGs/Labs/Other Studies Reviewed:    The following studies were reviewed today:   EKG:    Recent Labs: 09/22/2017: TSH 1.202 03/27/2018: ALT 9; BUN 13; Creatinine 0.98; Hemoglobin 13.3; Platelet Count 199; Potassium 4.2; Sodium 138  Recent Lipid Panel    Component Value Date/Time   CHOL 171 09/21/2017 1001   TRIG 117 09/21/2017 1001   HDL 53 09/21/2017 1001   CHOLHDL 3.2 09/21/2017 1001   VLDL 23 09/21/2017 1001   LDLCALC 95 09/21/2017 1001    Physical Exam:    VS:  BP 132/76   Pulse 89   Ht 5' (1.524 m)   Wt 155 lb 9.6 oz (70.6 kg)   SpO2 98%   BMI 30.39 kg/m     Wt Readings from Last 3 Encounters:  05/14/18 155 lb 9.6 oz (70.6 kg)  03/27/18 158 lb 14.4 oz (72.1 kg)  11/07/17 157 lb 1.9 oz (71.3 kg)     GEN: elderly female,  NAD  HEENT: Normal NECK: No JVD; No carotid bruits LYMPHATICS: No lymphadenopathy CARDIAC:   RR ,  RESPIRATORY:  Clear to auscultation without rales, wheezing or rhonchi  ABDOMEN: Soft, non-tender, non-distended MUSCULOSKELETAL:  No edema; No deformity  SKIN: Warm and dry NEUROLOGIC:  Alert and oriented x 3 PSYCHIATRIC:  Normal affect   ASSESSMENT:    1. Paroxysmal  atrial fibrillation (HCC)   2. Lipid screening    PLAN:    In order of problems listed above:  1. Paroxsymal atrial fib :    Is in NSR  HR is a bit high Somehow Cardizem has been taken off her list.  Will restart Cardizem at 180  mg a day.  She will return in 6 months to see Pecolia Ades, nurse practitioner.  Medication Adjustments/Labs and Tests Ordered: Current medicines are reviewed at length with the patient today.  Concerns regarding medicines are outlined above.  Orders Placed This Encounter  Procedures  . Basic Metabolic Panel (BMET)  . Lipid Profile  . Hepatic function panel   Meds ordered this encounter  Medications  . diltiazem (CARDIZEM CD) 180 MG 24 hr capsule    Sig: Take 1 capsule (180 mg total) by mouth daily.    Dispense:  90 capsule    Refill:  3   Patient Instructions  Medication Instructions:  Your physician has recommended you make the following change in your medication:   RESTART Cardizem CD 180 mg once daily  If you need a refill on your cardiac medications before your next appointment, please call your pharmacy.   Lab work: Your physician recommends that you return for lab work in: 6 months on the day of or a few days before your office visit with Dr. Acie Fredrickson.  You will need to FAST for this appointment - nothing to eat or drink after midnight the night before except water.   If you have labs (blood work) drawn today and your tests are completely normal, you will receive your results only by: Marland Kitchen MyChart Message (if you have MyChart) OR . A paper copy in the mail If you have any lab test that is abnormal or we need to change your treatment, we will call you to review the results.   Testing/Procedures: None Ordered   Follow-Up: At [facility], you and your health needs are our priority.  As part of our continuing mission to provide you with exceptional heart care, we have created designated Provider Care Teams.  These Care Teams include your  primary Cardiologist (physician) and Advanced Practice Providers (APPs -  Physician Assistants and Nurse Practitioners) who all work together to provide you with the care you need, when you need it. You will need a follow up appointment in:  6 months.  Please call our office 2 months in advance to schedule this appointment.  You may see Mertie Moores, MD or one of the following Advanced Practice Providers on your designated Care Team: Richardson Dopp, PA-C [place], [place] . Daune Perch, NP       Signed, Mertie Moores, MD  05/14/2018 7:22 PM    [facility]

## 2018-05-14 NOTE — Patient Instructions (Signed)
Medication Instructions:  Your physician has recommended you make the following change in your medication:   RESTART Cardizem CD 180 mg once daily  If you need a refill on your cardiac medications before your next appointment, please call your pharmacy.   Lab work: Your physician recommends that you return for lab work in: 6 months on the day of or a few days before your office visit with Dr. Acie Fredrickson.  You will need to FAST for this appointment - nothing to eat or drink after midnight the night before except water.   If you have labs (blood work) drawn today and your tests are completely normal, you will receive your results only by: Marland Kitchen MyChart Message (if you have MyChart) OR . A paper copy in the mail If you have any lab test that is abnormal or we need to change your treatment, we will call you to review the results.   Testing/Procedures: None Ordered   Follow-Up: At [facility], you and your health needs are our priority.  As part of our continuing mission to provide you with exceptional heart care, we have created designated Provider Care Teams.  These Care Teams include your primary Cardiologist (physician) and Advanced Practice Providers (APPs -  Physician Assistants and Nurse Practitioners) who all work together to provide you with the care you need, when you need it. You will need a follow up appointment in:  6 months.  Please call our office 2 months in advance to schedule this appointment.  You may see Mertie Moores, MD or one of the following Advanced Practice Providers on your designated Care Team: Richardson Dopp, PA-C [place], [place] . Daune Perch, NP

## 2018-07-31 ENCOUNTER — Telehealth: Payer: Self-pay

## 2018-07-31 NOTE — Telephone Encounter (Signed)
Faxed signed order to [facility], sent to [facility] for scanning.

## 2018-09-09 ENCOUNTER — Encounter: Payer: Self-pay | Admitting: Cardiology

## 2018-09-17 ENCOUNTER — Encounter: Payer: Self-pay | Admitting: Hematology

## 2018-09-29 ENCOUNTER — Ambulatory Visit: Payer: Medicare Other | Admitting: Cardiology

## 2018-09-29 ENCOUNTER — Encounter: Payer: Self-pay | Admitting: Cardiology

## 2018-09-29 VITALS — BP 122/80 | HR 95 | Ht 60.0 in | Wt 149.1 lb

## 2018-09-29 DIAGNOSIS — I480 Paroxysmal atrial fibrillation: Secondary | ICD-10-CM

## 2018-09-29 DIAGNOSIS — Z720 Tobacco use: Secondary | ICD-10-CM | POA: Diagnosis not present

## 2018-09-29 NOTE — Progress Notes (Signed)
Cardiology Office Note:    Date:  09/29/2018   ID:  Michelle Gonzales, DOB 1941-03-29, MRN [id number]  PCP:  [name], MD  Cardiologist:  Mertie Moores, MD  Referring MD: [name], MD   Chief Complaint  Patient presents with  . Follow-up    atrial fib    History of Present Illness:    Michelle Gonzales is a 78 y.o. female with a past medical history significant for PAF, breast cancer, tobacco abuse. The patient was admitted to the hospital on 09/21/17 with complaints of chest pain and was found to be in atrial fibrillation with rapid ventricular response.  She ruled out for MI but a nuclear stress test was abnormal so she underwent cardiac catheterization on 09/24/17 that showed minimal CAD.  She was discharged on diltiazem and metoprolol and in sinus rhythm.  Metoprolol was later discontinued due to significant fatigue.  She was last seen in the office on 05/14/2018 by Dr. Acie Fredrickson who noted that the patient's Cardizem had been taken off her list.  It was restarted at 180 mg/day.  Ms. Gutt is here for 6 month follow up alone. She is feeling well with no further symptoms of afib. She goes to the gym about 3 times per week for [employer]. She some slow cardio and says that it really doesn't get her heart rate up. She does breath harder with her exercise. No chest pain/pressure/tightness or shortness of breath out of proportion for exercise. No palpitations. She occ gets lightheaded, usually when she gets up at night to urinate. She now sits on the bed first before standing.   She is having no unusual bleeding.   Past Medical History:  Diagnosis Date  . Allergy    Pcn, Sulfa, Codeine  . Anxiety   . Arthritis   . Atrial fibrillation ([place]) 09/22/2017  . Cataract    surgey b/l  . COPD (chronic obstructive pulmonary disease) ([place])   . Cough   . Depression    hx years ago  . Hot flashes   . Shortness of breath   . Sjogren's disease [facility])     Past Surgical History:    Procedure Laterality Date  . ABDOMINAL HYSTERECTOMY  74   partial  . BREAST LUMPECTOMY WITH NEEDLE LOCALIZATION AND AXILLARY SENTINEL LYMPH NODE BX Right 02/01/2013   Procedure: BREAST LUMPECTOMY WITH NEEDLE LOCALIZATION AND AXILLARY SENTINEL LYMPH NODE BX;  Surgeon: Odis Hollingshead, MD;  Location: [facility];  Service: General;  Laterality: Right;  . BREAST SURGERY Right 1965   biopsy  . CERVICAL FUSION  2010  . CHOLECYSTECTOMY  2002  . COLONOSCOPY    . DILATION AND CURETTAGE OF UTERUS    . LEFT HEART CATH AND CORONARY ANGIOGRAPHY N/A 09/24/2017   Procedure: LEFT HEART CATH AND CORONARY ANGIOGRAPHY;  Surgeon: Leonie Man, MD;  Location: [facility];  Service: Cardiovascular;  Laterality: N/A;  . ULTRASOUND GUIDANCE FOR VASCULAR ACCESS Right 09/24/2017   Procedure: Ultrasound Guidance For Vascular Access;  Surgeon: Leonie Man, MD;  Location: [facility];  Service: Cardiovascular;  Laterality: Right;    Current Medications: Current Meds  Medication Sig  . apixaban (ELIQUIS) 5 MG TABS tablet Take 1 tablet (5 mg total) by mouth 2 (two) times daily.  . beta carotene w/minerals (OCUVITE) tablet Take 1 tablet by mouth daily.  . Carboxymethylcellul-Glycerin (LUBRICATING EYE DROPS OP) Place 1 drop into both eyes daily as needed (Dry Eyes).  Marland Kitchen  cholecalciferol (VITAMIN D) 1000 units tablet Take 1,000 Units by mouth daily.   Marland Kitchen diltiazem (CARDIZEM CD) 180 MG 24 hr capsule Take 1 capsule (180 mg total) by mouth daily.  . Misc Natural Products (OSTEO BI-FLEX TRIPLE STRENGTH PO) Take 1 tablet by mouth daily.  . montelukast (SINGULAIR) 10 MG tablet Take 10 mg by mouth at bedtime.   . traZODone (DESYREL) 50 MG tablet Take 0.5 tablets by mouth at bedtime as needed.  . vitamin C (ASCORBIC ACID) 500 MG tablet Take 500 mg by mouth daily.      Allergies:   Demerol [meperidine]; Penicillins; Codeine; and Sulfa antibiotics   Social History   Socioeconomic History  .  Marital status: Married    Spouse name: Not on file  . Number of children: Not on file  . Years of education: Not on file  . Highest education level: Not on file  Occupational History  . Not on file  Social Needs  . Financial resource strain: Not on file  . Food insecurity:    Worry: Not on file    Inability: Not on file  . Transportation needs:    Medical: Not on file    Non-medical: Not on file  Tobacco Use  . Smoking status: Former Smoker    Packs/day: 0.50    Types: Cigarettes    Last attempt to quit: 10/07/2017    Years since quitting: 0.9  . Smokeless tobacco: Never Used  Substance and Sexual Activity  . Alcohol use: Yes    Comment: rare  . Drug use: No  . Sexual activity: Yes  Lifestyle  . Physical activity:    Days per week: Not on file    Minutes per session: Not on file  . Stress: Not on file  Relationships  . Social connections:    Talks on phone: Not on file    Gets together: Not on file    Attends religious service: Not on file    Active member of club or organization: Not on file    Attends meetings of clubs or organizations: Not on file    Relationship status: Not on file  Other Topics Concern  . Not on file  Social History Narrative  . Not on file     Family History: The patient's family history includes Breast cancer in her mother. ROS:   Please see the history of present illness.     All other systems reviewed and are negative.  EKGs/Labs/Other Studies Reviewed:    The following studies were reviewed today:  LEFT HEART CATH AND CORONARY ANGIOGRAPHY 09/24/2017  Conclusion   Prox LAD to Mid LAD lesion is 20% stenosed. Prox RCA lesion is 20% stenosed. -Otherwise essentially normal coronary arteries with no culprit lesion to explain abnormal stress test.  The left ventricular systolic function is normal. The left ventricular ejection fraction is 55-65% by visual estimate.  LV end diastolic pressure is normal.  There is mild (2+) mitral  regurgitation.   Angiographically minimal coronary arteries.  Normal LV function.  Normal LVEDP. Suspect that chest pain is related to atrial fibrillation as opposed to ischemic CAD related angina.   Echocardiogram 09/22/2017 Study Conclusions - Left ventricle: The cavity size was normal. There was mild   concentric hypertrophy. Systolic function was normal. The   estimated ejection fraction was in the range of 55% to 60%. Wall   motion was normal; there were no regional wall motion   abnormalities. - Aortic valve: There was mild  regurgitation. - Mitral valve: Mild focal calcification of the anterior leaflet   (medial segment(s)). There was trivial regurgitation. - Pericardium, extracardiac: A trivial, free-flowing pericardial   effusion was identified along the right ventricular free wall.   The fluid had no internal echoes.  EKG:  EKG is not ordered today.    Recent Labs: 03/27/2018: ALT 9; BUN 13; Creatinine 0.98; Hemoglobin 13.3; Platelet Count 199; Potassium 4.2; Sodium 138   Recent Lipid Panel    Component Value Date/Time   CHOL 171 09/21/2017 1001   TRIG 117 09/21/2017 1001   HDL 53 09/21/2017 1001   CHOLHDL 3.2 09/21/2017 1001   VLDL 23 09/21/2017 1001   LDLCALC 95 09/21/2017 1001    Physical Exam:    VS:  BP 122/80   Pulse 95   Ht 5' (1.524 m)   Wt 149 lb 1.9 oz (67.6 kg)   SpO2 96%   BMI 29.12 kg/m     Wt Readings from Last 3 Encounters:  09/29/18 149 lb 1.9 oz (67.6 kg)  05/14/18 155 lb 9.6 oz (70.6 kg)  03/27/18 158 lb 14.4 oz (72.1 kg)     Physical Exam  Constitutional: She is oriented to person, place, and time. She appears well-developed and well-nourished. No distress.  HENT:  Head: Normocephalic and atraumatic.  Neck: Normal range of motion. Neck supple. No JVD present.  Cardiovascular: Normal rate, regular rhythm and intact distal pulses. Exam reveals no gallop and no friction rub.  Murmur heard.  Harsh midsystolic murmur is present with a grade  of 1/6 at the upper right sternal border and upper left sternal border. Pulmonary/Chest: Effort normal and breath sounds normal. No respiratory distress. She has no wheezes. She has no rales.  Abdominal: Soft. Bowel sounds are normal.  Musculoskeletal: Normal range of motion.        General: No deformity or edema.  Neurological: She is alert and oriented to person, place, and time.  Skin: Skin is warm and dry.  Psychiatric: She has a normal mood and affect. Her behavior is normal. Judgment and thought content normal.  Vitals reviewed.   ASSESSMENT:    1. Paroxysmal atrial fibrillation (HCC)   2. Tobacco abuse    PLAN:    In order of problems listed above:  Paroxysmal atrial fibrillation -Maintaining sinus rhythm on Cardizem 180 mg daily.  She did not tolerate metoprolol well due to fatigue. -Feeling well.  -CHA2DS2/VAS Stroke RiskScore is 3 (Age (3), female). Anticoagulated with Eliquis 5 mg bid for stroke risk reduction. No unusual bleeding.  Tobacco abuse -Pt had quit but went back to smoking, 10 cigarettes or less per day. Her husband won't quit smoking and this makes it difficult for her.   Medication Adjustments/Labs and Tests Ordered: Current medicines are reviewed at length with the patient today.  Concerns regarding medicines are outlined above. Labs and tests ordered and medication changes are outlined in the patient instructions below:  Patient Instructions  Smoking Tobacco Information, Adult Smoking tobacco can be harmful to your health. Tobacco contains a poisonous (toxic), colorless chemical called nicotine. Nicotine is addictive. It changes the brain and can make it hard to stop smoking. Tobacco also has other toxic chemicals that can hurt your body and raise your risk of many cancers. How can smoking tobacco affect me? Smoking tobacco puts you at risk for:  Cancer. Smoking is most commonly associated with lung cancer, but can also lead to cancer in other parts of  the body.  Chronic obstructive pulmonary disease (COPD). This is a long-term lung condition that makes it hard to breathe. It also gets worse over time.  High blood pressure (hypertension), heart disease, stroke, or heart attack.  Lung infections, such as pneumonia.  Cataracts. This is when the lenses in the eyes become clouded.  Digestive problems. This may include peptic ulcers, heartburn, and gastroesophageal reflux disease (GERD).  Oral health problems, such as gum disease and tooth loss.  Loss of taste and smell. Smoking can affect your appearance by causing:  Wrinkles.  Yellow or stained teeth, fingers, and fingernails. Smoking tobacco can also affect your social life, because:  It may be challenging to find places to smoke when away from home. Many workplaces, [employer], hotels, and public places are tobacco-free.  Smoking is expensive. This is due to the cost of tobacco and the long-term costs of treating health problems from smoking.  Secondhand smoke may affect those around you. Secondhand smoke can cause lung cancer, breathing problems, and heart disease. Children of smokers have a higher risk for: ? Sudden infant death syndrome (SIDS). ? Ear infections. ? Lung infections. If you currently smoke tobacco, quitting now can help you:  Lead a longer and healthier life.  Look, smell, breathe, and feel better over time.  Save money.  Protect others from the harms of secondhand smoke. What actions can I take to prevent health problems? Quit smoking   Do not start smoking. Quit if you already do.  Make a plan to quit smoking and commit to it. Look for programs to help you and ask your health care provider for recommendations and ideas.  Set a date and write down all the reasons you want to quit.  Let your friends and family know you are quitting so they can help and support you. Consider finding friends who also want to quit. It can be easier to quit with someone  else, so that you can support each other.  Talk with your health care provider about using nicotine replacement medicines to help you quit, such as gum, lozenges, patches, sprays, or pills.  Do not replace cigarette smoking with electronic cigarettes, which are commonly called e-cigarettes. The safety of e-cigarettes is not known, and some may contain harmful chemicals.  If you try to quit but return to smoking, stay positive. It is common to slip up when you first quit, so take it one day at a time.  Be prepared for cravings. When you feel the urge to smoke, chew gum or suck on hard candy. Lifestyle  Stay busy and take care of your body.  Drink enough fluid to keep your urine pale yellow.  Get plenty of exercise and eat a healthy diet. This can help prevent weight gain after quitting.  Monitor your eating habits. Quitting smoking can cause you to have a larger appetite than when you smoke.  Find ways to relax. Go out with friends or family to a movie or a restaurant where people do not smoke.  Ask your health care provider about having regular tests (screenings) to check for cancer. This may include blood tests, imaging tests, and other tests.  Find ways to manage your stress, such as meditation, yoga, or exercise. Where to find support To get support to quit smoking, consider:  Asking your health care provider for more information and resources.  Taking classes to learn more about quitting smoking.  Looking for local organizations that offer resources about quitting smoking.  Joining a support group  for people who want to quit smoking in your local community.  Calling the smokefree.gov counselor helpline: 1-800-Quit-Now 715-224-3976) Where to find more information You may find more information about quitting smoking from:  HelpGuide.org: www.helpguide.org  https://hall.com/: smokefree.gov  American Lung Association: www.lung.org Contact a health care provider if  you:  Have problems breathing.  Notice that your lips, nose, or fingers turn blue.  Have chest pain.  Are coughing up blood.  Feel faint or you pass out.  Have other health changes that cause you to worry. Summary  Smoking tobacco can negatively affect your health, the health of those around you, your finances, and your social life.  Do not start smoking. Quit if you already do. If you need help quitting, ask your health care provider.  Think about joining a support group for people who want to quit smoking in your local community. There are many effective programs that will help you to quit this behavior. This information is not intended to replace advice given to you by your health care provider. Make sure you discuss any questions you have with your health care provider. Document Released: 07/23/2016 Document Revised: 08/27/2017 Document Reviewed: 07/23/2016 Elsevier Interactive Patient Education  2019 [place], Daune Perch, NP  09/29/2018 3:31 PM    [facility]

## 2018-09-29 NOTE — Patient Instructions (Addendum)
Medication Instructions:  Your physician recommends that you continue on your current medications as directed. Please refer to the Current Medication list given to you today.  If you need a refill on your cardiac medications before your next appointment, please call your pharmacy.   Lab work: None  If you have labs (blood work) drawn today and your tests are completely normal, you will receive your results only by: Marland Kitchen MyChart Message (if you have MyChart) OR . A paper copy in the mail If you have any lab test that is abnormal or we need to change your treatment, we will call you to review the results.  Testing/Procedures: None  Follow-Up: At [facility], you and your health needs are our priority.  As part of our continuing mission to provide you with exceptional heart care, we have created designated Provider Care Teams.  These Care Teams include your primary Cardiologist (physician) and Advanced Practice Providers (APPs -  Physician Assistants and Nurse Practitioners) who all work together to provide you with the care you need, when you need it. You will need a follow up appointment in:  6 months.  Please call our office 2 months in advance to schedule this appointment.  You may see Mertie Moores, MD or one of the following Advanced Practice Providers on your designated Care Team: Richardson Dopp, PA-C [place], [place] . Daune Perch, NP  Any Other Special Instructions Will Be Listed Below (If Applicable).  Smoking Tobacco Information, Adult Smoking tobacco can be harmful to your health. Tobacco contains a poisonous (toxic), colorless chemical called nicotine. Nicotine is addictive. It changes the brain and can make it hard to stop smoking. Tobacco also has other toxic chemicals that can hurt your body and raise your risk of many cancers. How can smoking tobacco affect me? Smoking tobacco puts you at risk for:  Cancer. Smoking is most commonly associated with lung cancer, but can  also lead to cancer in other parts of the body.  Chronic obstructive pulmonary disease (COPD). This is a long-term lung condition that makes it hard to breathe. It also gets worse over time.  High blood pressure (hypertension), heart disease, stroke, or heart attack.  Lung infections, such as pneumonia.  Cataracts. This is when the lenses in the eyes become clouded.  Digestive problems. This may include peptic ulcers, heartburn, and gastroesophageal reflux disease (GERD).  Oral health problems, such as gum disease and tooth loss.  Loss of taste and smell. Smoking can affect your appearance by causing:  Wrinkles.  Yellow or stained teeth, fingers, and fingernails. Smoking tobacco can also affect your social life, because:  It may be challenging to find places to smoke when away from home. Many workplaces, [employer], hotels, and public places are tobacco-free.  Smoking is expensive. This is due to the cost of tobacco and the long-term costs of treating health problems from smoking.  Secondhand smoke may affect those around you. Secondhand smoke can cause lung cancer, breathing problems, and heart disease. Children of smokers have a higher risk for: ? Sudden infant death syndrome (SIDS). ? Ear infections. ? Lung infections. If you currently smoke tobacco, quitting now can help you:  Lead a longer and healthier life.  Look, smell, breathe, and feel better over time.  Save money.  Protect others from the harms of secondhand smoke. What actions can I take to prevent health problems? Quit smoking   Do not start smoking. Quit if you already do.  Make a plan to quit  smoking and commit to it. Look for programs to help you and ask your health care provider for recommendations and ideas.  Set a date and write down all the reasons you want to quit.  Let your friends and family know you are quitting so they can help and support you. Consider finding friends who also want to quit.  It can be easier to quit with someone else, so that you can support each other.  Talk with your health care provider about using nicotine replacement medicines to help you quit, such as gum, lozenges, patches, sprays, or pills.  Do not replace cigarette smoking with electronic cigarettes, which are commonly called e-cigarettes. The safety of e-cigarettes is not known, and some may contain harmful chemicals.  If you try to quit but return to smoking, stay positive. It is common to slip up when you first quit, so take it one day at a time.  Be prepared for cravings. When you feel the urge to smoke, chew gum or suck on hard candy. Lifestyle  Stay busy and take care of your body.  Drink enough fluid to keep your urine pale yellow.  Get plenty of exercise and eat a healthy diet. This can help prevent weight gain after quitting.  Monitor your eating habits. Quitting smoking can cause you to have a larger appetite than when you smoke.  Find ways to relax. Go out with friends or family to a movie or a restaurant where people do not smoke.  Ask your health care provider about having regular tests (screenings) to check for cancer. This may include blood tests, imaging tests, and other tests.  Find ways to manage your stress, such as meditation, yoga, or exercise. Where to find support To get support to quit smoking, consider:  Asking your health care provider for more information and resources.  Taking classes to learn more about quitting smoking.  Looking for local organizations that offer resources about quitting smoking.  Joining a support group for people who want to quit smoking in your local community.  Calling the smokefree.gov counselor helpline: 1-800-Quit-Now (954) 115-3068) Where to find more information You may find more information about quitting smoking from:  HelpGuide.org: www.helpguide.org  https://hall.com/: smokefree.gov  American Lung Association:  www.lung.org Contact a health care provider if you:  Have problems breathing.  Notice that your lips, nose, or fingers turn blue.  Have chest pain.  Are coughing up blood.  Feel faint or you pass out.  Have other health changes that cause you to worry. Summary  Smoking tobacco can negatively affect your health, the health of those around you, your finances, and your social life.  Do not start smoking. Quit if you already do. If you need help quitting, ask your health care provider.  Think about joining a support group for people who want to quit smoking in your local community. There are many effective programs that will help you to quit this behavior. This information is not intended to replace advice given to you by your health care provider. Make sure you discuss any questions you have with your health care provider. Document Released: 07/23/2016 Document Revised: 08/27/2017 Document Reviewed: 07/23/2016 Elsevier Interactive Patient Education  2019 [employer].

## 2018-10-19 ENCOUNTER — Other Ambulatory Visit: Payer: Self-pay | Admitting: Cardiovascular Disease

## 2018-10-19 NOTE — Telephone Encounter (Signed)
Eliquis 5mg  refill request received; pt is 78 yrs old, wt-67.6kg, Crea-0.98 on 03/27/2018, last seen by Daune Perch on 09/29/2018. Refill sent.

## 2019-03-10 ENCOUNTER — Telehealth: Payer: Self-pay

## 2019-03-10 NOTE — Telephone Encounter (Signed)
Faxed signed order back to [place], sent to [facility] for scan to chart.

## 2019-03-22 NOTE — Progress Notes (Signed)
[place]   [phone] [phone]   Clinic Follow up Note   Patient Care Team: [name], MD as PCP - General (Family Medicine) [name], [name], MD as PCP - Cardiology (Cardiology)  Date of Service:  03/26/2019  CHIEF COMPLAINT: F/u of right breast cancer  SUMMARY OF ONCOLOGIC HISTORY:  Oncology History  Breast cancer of upper-outer quadrant of right female breast ([place])  01/06/2013 Initial Biopsy   Patient underwent a stereotactic biopsy of the 9:00 nodule. The pathology showed an invasive ductal carcinoma with ductal carcinoma in situ. The tumor was grade 2 with associated high-grade DCIS. There was ER positive PR positive HER-2/neu negative with a Ki-67 of 10%.    01/11/2013 Breast MRI    She had MRI of the breasts performed the MRI did not show any abnormal enhancement in the left breast. However in the right breast there was an 8 x 8 x 6 mm enhancing mass in the 9:00 region. There were post biopsy change is noted and biopsy clip was found   01/13/2013 Initial Diagnosis   Breast cancer of upper-outer quadrant of right female breast ([place])   12/2012 Mammogram   Patient was seen for a six-month recall mammogram for a suspicious right breast nodule. This was at the 9:00 position. Because of this she underwent a repeat diagnostic mammogram performed on 01/05/2013. There was a small nodule that persisted appearing to be most likely irregular and margination. She had an ultrasound that demonstrated a small 3-4 mm nodular density.   02/01/2013 Pathology Results   Surgery: Right breast lumpectomy  Diagnosis 1. Lymph node, sentinel, biopsy, Right axillary - THERE IS NO EVIDENCE OF CARCINOMA IN 1 OF 1 LYMPH NODE (0/1). 2. Breast, lumpectomy, Right - INVASIVE DUCTAL CARCINOMA, GRADE I/III, SPANNING 0.4 CM. - DUCTAL CARCINOMA IN SITU, LOW GRADE. - LOBULAR NEOPLASIA (ATYPICAL LOBULAR HYPERPLASIA). - THE SURGICAL RESECTION MARGINS ARE NEGATIVE FOR  CARCINOMA. - SEE ONCOLOGY TABLE BELOW.   Tumor was ER +100% PR +80%. HER-2/neu negative with a Ki-67 of 50%.    02/24/2013 - 01/2018 Anti-estrogen oral therapy   Aromasin 30m daily starting 02/24/2013, changed to tamoxifen in July 2017 due to muscular achiness. Stopped in 01/2018 due to depression.      CURRENT THERAPY:  Surveillance  INTERVAL HISTORY:  Michelle SIVERSONis here for a follow up of right breast cancer. She presents to the clinic alone. She notes she does not remember why she did not tolerate Tamoxifen.  She lost 7 pounds in less then 6 months. She notes she has been eating more and exercise less since COVID. She denies nay pain, changes or discomfort. She notes she does not sleep well at night so she takes Melatonin which helps some. She notes she does not have the amount of energy she had before due to less exercise.  She notes she smokes less recently. She did quit smoking in the past. Her husband smokes as well.     REVIEW OF SYSTEMS:   Constitutional: Denies fevers, chills (+) unknown mild weight loss Eyes: Denies blurriness of vision Ears, nose, mouth, throat, and face: Denies mucositis or sore throat Respiratory: Denies cough, dyspnea or wheezes Cardiovascular: Denies palpitation, chest discomfort or lower extremity swelling Gastrointestinal:  Denies nausea, heartburn or change in bowel habits Skin: Denies abnormal skin rashes Lymphatics: Denies new lymphadenopathy or easy bruising Neurological:Denies numbness, tingling or new weaknesses Behavioral/Psych: Mood is stable, no new changes  All other systems were reviewed  with the patient and are negative.  MEDICAL HISTORY:  Past Medical History:  Diagnosis Date  . Allergy    Pcn, Sulfa, Codeine  . Anxiety   . Arthritis   . Atrial fibrillation ([place]) 09/22/2017  . Cataract    surgey b/l  . COPD (chronic obstructive pulmonary disease) ([place])   . Cough   . Depression    hx years ago  . Hot flashes   . Shortness  of breath   . Sjogren's disease ([place])     SURGICAL HISTORY: Past Surgical History:  Procedure Laterality Date  . ABDOMINAL HYSTERECTOMY  74   partial  . BREAST LUMPECTOMY WITH NEEDLE LOCALIZATION AND AXILLARY SENTINEL LYMPH NODE BX Right 02/01/2013   Procedure: BREAST LUMPECTOMY WITH NEEDLE LOCALIZATION AND AXILLARY SENTINEL LYMPH NODE BX;  Surgeon: Odis Hollingshead, MD;  Location: [facility];  Service: General;  Laterality: Right;  . BREAST SURGERY Right 1965   biopsy  . CERVICAL FUSION  2010  . CHOLECYSTECTOMY  2002  . COLONOSCOPY    . DILATION AND CURETTAGE OF UTERUS    . LEFT HEART CATH AND CORONARY ANGIOGRAPHY N/A 09/24/2017   Procedure: LEFT HEART CATH AND CORONARY ANGIOGRAPHY;  Surgeon: Leonie Man, MD;  Location: [facility];  Service: Cardiovascular;  Laterality: N/A;  . ULTRASOUND GUIDANCE FOR VASCULAR ACCESS Right 09/24/2017   Procedure: Ultrasound Guidance For Vascular Access;  Surgeon: Leonie Man, MD;  Location: [facility];  Service: Cardiovascular;  Laterality: Right;    I have reviewed the social history and family history with the patient and they are unchanged from previous note.  ALLERGIES:  is allergic to demerol [meperidine]; penicillins; codeine; and sulfa antibiotics.  MEDICATIONS:  Current Outpatient Medications  Medication Sig Dispense Refill  . beta carotene w/minerals (OCUVITE) tablet Take 1 tablet by mouth daily.    . Carboxymethylcellul-Glycerin (LUBRICATING EYE DROPS OP) Place 1 drop into both eyes daily as needed (Dry Eyes).    . cholecalciferol (VITAMIN D) 1000 units tablet Take 1,000 Units by mouth daily.     Marland Kitchen diltiazem (CARDIZEM CD) 180 MG 24 hr capsule Take 1 capsule (180 mg total) by mouth daily. 90 capsule 3  . ELIQUIS 5 MG TABS tablet TAKE 1 TABLET(5 MG) BY MOUTH TWICE DAILY 60 tablet 8  . Misc Natural Products (OSTEO BI-FLEX TRIPLE STRENGTH PO) Take 1 tablet by mouth daily.    . montelukast (SINGULAIR) 10  MG tablet Take 10 mg by mouth at bedtime.     . traZODone (DESYREL) 50 MG tablet Take 0.5 tablets by mouth at bedtime as needed.    . vitamin C (ASCORBIC ACID) 500 MG tablet Take 500 mg by mouth daily.      No current facility-administered medications for this visit.     PHYSICAL EXAMINATION: ECOG PERFORMANCE STATUS: 1 - Symptomatic but completely ambulatory  Vitals:   03/26/19 1256  BP: 125/76  Pulse: 81  Resp: 18  Temp: 98.2 F (36.8 C)  SpO2: 99%   Filed Weights   03/26/19 1256  Weight: 142 lb 4.8 oz (64.5 kg)    GENERAL:alert, no distress and comfortable SKIN: skin color, texture, turgor are normal, no rashes or significant lesions EYES: normal, Conjunctiva are pink and non-injected, sclera clear  NECK: supple, thyroid normal size, non-tender, without nodularity LYMPH:  no palpable lymphadenopathy in the cervical, axillary  LUNGS: clear to percussion with normal breathing effort (+) Mild crackling  HEART: regular rate & rhythm  and no murmurs and no lower extremity edema ABDOMEN:abdomen soft, non-tender and normal bowel sounds Musculoskeletal:no cyanosis of digits and no clubbing  NEURO: alert & oriented x 3 with fluent speech, no focal motor/sensory deficits BREAST: S/p right lumpectomy: Surgical incision healed well with mild scar tissue. No palpable mass, nodules or adenopathy bilaterally. Breast exam benign.   LABORATORY DATA:  I have reviewed the data as listed CBC Latest Ref Rng & Units 03/26/2019 03/27/2018 11/03/2017  WBC 4.0 - 10.5 K/uL 8.3 8.2 8.4  Hemoglobin 12.0 - 15.0 g/dL 13.5 13.3 13.7  Hematocrit 36.0 - 46.0 % 40.8 40.2 40.8  Platelets 150 - 400 K/uL 199 199 198     CMP Latest Ref Rng & Units 03/26/2019 03/27/2018 11/03/2017  Glucose 70 - 99 mg/dL 100(H) 132(H) 127  BUN 8 - 23 mg/dL _0 Creatinine 0.44 - 1.00 mg/dL 0.81 0.98 0.86  Sodium 135 - 145 mmol/L 135 138 139  Potassium 3.5 - 5.1 mmol/L 3.9 4.2 4.0  Chloride 98 - 111 mmol/L 99 102 104  CO2 22  - 32 mmol/L _1 Calcium 8.9 - 10.3 mg/dL 8.4(L) 9.2 9.2  Total Protein 6.5 - 8.1 g/dL 7.1 7.1 7.3  Total Bilirubin 0.3 - 1.2 mg/dL 0.4 0.4 0.4  Alkaline Phos 38 - 126 U/L 111 71 66  AST 15 - 41 U/L _2 ALT 0 - 44 U/L _3 RADIOGRAPHIC STUDIES: I have personally reviewed the radiological images as listed and agreed with the findings in the report. No results found.   ASSESSMENT & PLAN:  Michelle Gonzales is a 78 y.o. female with   1. Breast cancer of upper outer quadrant of right breast, invasive ductal carcinoma, [id number], stage IA, ER positive PR positive HER-2/neu negative. -She was diagnosed in 12/2012. She is s/p right lumpectomy.  -She was started on Aromasin in 02/2013 and she took it for 3 years. She switched to Tamoxifen in 01/2016 due to the diffuse muscular achiness. She is tolerating tamoxifen very well, I recommend her to have it for 5 years total.  -She stopped Tamoxifen in 01/2018 due to depression after stopping her depression resolved.  -She is clinically doing well. Lab reviewed, her CBC and CMP are within normal limits. Her physical exam and her 08/2018 mammogram were unremarkable. There is no clinical concern for recurrence. -She has had mild weight loss in less than 6 months without trying. I encouraged her to watch her weight and try to be active at home to help her energy.  -Continue Surveillance. Next mammogram in 08/2019 -F/u in 8 months, then yearly   2. Bone health -She had bone density scan in August 2016 which was normal -I encouraged her to continue taking calcium (she is on 3 times a week) and vitamin D daily, and regular exercise. -DEXA from 03/12/2017 was [facility] at [facility]   3. Insomnia   -She has had ongoing issues with waking up in the middle of the night and being unable to fall back asleep. I urged her to try taking OTC Melatonin to help with this. I also previously informed her to decrease her caffeine intake in the evenings, decrease  her life stressors, and to rest in the hours leading up to going to bed.  -She will f/u w/ her PCP   4. Chronic medical issues she'll continue follow-up with her primary care physician for other medical problems.   5. Chest  pain and Afib  -She went to the ED and was admitted to the hospital on 09/21/17 with complaints of centralized chest pain. Her coronary angiography showed no significant stenosis. -Mali vasc score is 3 -She is now being followed by cardiologist Dr. Acie Fredrickson and was started on cardizem.  -I previously discussed that antiestrogen therapy slightly increases risk of cardiovascular disease.  PLAN:  -Mammogram 08/2019 -Lab and f/u with NP Lacie in 8 months     No problem-specific Assessment & Plan notes found for this encounter.   No orders of the defined types were placed in this encounter.  All questions were answered. The patient knows to call the clinic with any problems, questions or concerns. No barriers to learning was detected. I spent 15 minutes counseling the patient face to face. The total time spent in the appointment was 20 minutes and more than 50% was on counseling and review of test results     Truitt Merle, MD 03/26/2019   I, Joslyn Devon, am acting as scribe for Truitt Merle, MD.   I have reviewed the above documentation for accuracy and completeness, and I agree with the above.

## 2019-03-26 ENCOUNTER — Inpatient Hospital Stay: Payer: Medicare Other | Attending: Hematology | Admitting: Hematology

## 2019-03-26 ENCOUNTER — Inpatient Hospital Stay: Payer: Medicare Other

## 2019-03-26 ENCOUNTER — Other Ambulatory Visit: Payer: Self-pay

## 2019-03-26 ENCOUNTER — Telehealth: Payer: Self-pay | Admitting: Hematology

## 2019-03-26 ENCOUNTER — Encounter: Payer: Self-pay | Admitting: Hematology

## 2019-03-26 VITALS — BP 125/76 | HR 81 | Temp 98.2°F | Resp 18 | Ht 60.0 in | Wt 142.3 lb

## 2019-03-26 DIAGNOSIS — G4700 Insomnia, unspecified: Secondary | ICD-10-CM | POA: Diagnosis not present

## 2019-03-26 DIAGNOSIS — Z170 Estrogen receptor positive status [ER+]: Secondary | ICD-10-CM | POA: Diagnosis not present

## 2019-03-26 DIAGNOSIS — I4891 Unspecified atrial fibrillation: Secondary | ICD-10-CM | POA: Insufficient documentation

## 2019-03-26 DIAGNOSIS — C50411 Malignant neoplasm of upper-outer quadrant of right female breast: Secondary | ICD-10-CM

## 2019-03-26 DIAGNOSIS — Z853 Personal history of malignant neoplasm of breast: Secondary | ICD-10-CM | POA: Diagnosis present

## 2019-03-26 LAB — CBC WITH DIFFERENTIAL (CANCER CENTER ONLY)
Abs Immature Granulocytes: 0.04 10*3/uL (ref 0.00–0.07)
Basophils Absolute: 0.1 10*3/uL (ref 0.0–0.1)
Basophils Relative: 1 %
Eosinophils Absolute: 0.1 10*3/uL (ref 0.0–0.5)
Eosinophils Relative: 1 %
HCT: 40.8 % (ref 36.0–46.0)
Hemoglobin: 13.5 g/dL (ref 12.0–15.0)
Immature Granulocytes: 1 %
Lymphocytes Relative: 22 %
Lymphs Abs: 1.9 10*3/uL (ref 0.7–4.0)
MCH: 30.1 pg (ref 26.0–34.0)
MCHC: 33.1 g/dL (ref 30.0–36.0)
MCV: 90.9 fL (ref 80.0–100.0)
Monocytes Absolute: 0.6 10*3/uL (ref 0.1–1.0)
Monocytes Relative: 7 %
Neutro Abs: 5.7 10*3/uL (ref 1.7–7.7)
Neutrophils Relative %: 68 %
Platelet Count: 199 10*3/uL (ref 150–400)
RBC: 4.49 MIL/uL (ref 3.87–5.11)
RDW: 12.9 % (ref 11.5–15.5)
WBC Count: 8.3 10*3/uL (ref 4.0–10.5)
nRBC: 0 % (ref 0.0–0.2)

## 2019-03-26 LAB — CMP (CANCER CENTER ONLY)
ALT: 12 U/L (ref 0–44)
AST: 16 U/L (ref 15–41)
Albumin: 3.7 g/dL (ref 3.5–5.0)
Alkaline Phosphatase: 111 U/L (ref 38–126)
Anion gap: 10 (ref 5–15)
BUN: 15 mg/dL (ref 8–23)
CO2: 26 mmol/L (ref 22–32)
Calcium: 8.4 mg/dL — ABNORMAL LOW (ref 8.9–10.3)
Chloride: 99 mmol/L (ref 98–111)
Creatinine: 0.81 mg/dL (ref 0.44–1.00)
GFR, Est AFR Am: >60 mL/min (ref >60)
GFR, Estimated: >60 mL/min (ref >60)
Glucose, Bld: 100 mg/dL — ABNORMAL HIGH (ref 70–99)
Potassium: 3.9 mmol/L (ref 3.5–5.1)
Sodium: 135 mmol/L (ref 135–145)
Total Bilirubin: 0.4 mg/dL (ref 0.3–1.2)
Total Protein: 7.1 g/dL (ref 6.5–8.1)

## 2019-03-26 NOTE — Telephone Encounter (Signed)
Gave avs and calendar ° °

## 2019-03-30 ENCOUNTER — Encounter: Payer: Self-pay | Admitting: Hematology

## 2019-04-01 NOTE — Progress Notes (Signed)
Cardiology Office Note:    Date:  04/02/2019   ID:  Michelle Gonzales, DOB [birth year], MRN [id number]  PCP:  [name], MD  Cardiologist:  Mertie Moores, MD  Electrophysiologist:  None   Referring MD: [name], MD   Problem list 1.  Atrial fibrillation -paroxysmal 2.  Breast cancer 3.  History of chest pain-heart catheterization in March, 2019 showed minimal CAD    Chief Complaint  Patient presents with  . Atrial Fibrillation     Michelle Gonzales is a 78 y.o. female with a hx of atrial fibrillation.  She has been seen by Pecolia Ades, nurse practitioner.  I met her in the hospital in March, 2019. Has started back smoking  ( her husband continued to smoke inside the house )   Had PAF on the event monitor She cannot tell when she is in A. fib versus sinus rhythm.  She denies any chest pain, shortness of breath, or passing out.  Has not been exercising .    Sept. 11, 2020:  Doing well.  No cp or dyspnea.    Walks outside on occasion.   cannot go to the gym  Cannot feel when she is in atrial fib In on Eliquis. CBC and  BMP have been stable   Past Medical History:  Diagnosis Date  . Allergy    Pcn, Sulfa, Codeine  . Anxiety   . Arthritis   . Atrial fibrillation ([place]) 09/22/2017  . Cataract    surgey b/l  . COPD (chronic obstructive pulmonary disease) ([place])   . Cough   . Depression    hx years ago  . Hot flashes   . Shortness of breath   . Sjogren's disease [facility])     Past Surgical History:  Procedure Laterality Date  . ABDOMINAL HYSTERECTOMY  74   partial  . BREAST LUMPECTOMY WITH NEEDLE LOCALIZATION AND AXILLARY SENTINEL LYMPH NODE BX Right 02/01/2013   Procedure: BREAST LUMPECTOMY WITH NEEDLE LOCALIZATION AND AXILLARY SENTINEL LYMPH NODE BX;  Surgeon: Odis Hollingshead, MD;  Location: [facility];  Service: General;  Laterality: Right;  . BREAST SURGERY Right 1965   biopsy  . CERVICAL FUSION  2010  . CHOLECYSTECTOMY  2002  .  COLONOSCOPY    . DILATION AND CURETTAGE OF UTERUS    . LEFT HEART CATH AND CORONARY ANGIOGRAPHY N/A 09/24/2017   Procedure: LEFT HEART CATH AND CORONARY ANGIOGRAPHY;  Surgeon: Leonie Man, MD;  Location: [facility];  Service: Cardiovascular;  Laterality: N/A;  . ULTRASOUND GUIDANCE FOR VASCULAR ACCESS Right 09/24/2017   Procedure: Ultrasound Guidance For Vascular Access;  Surgeon: Leonie Man, MD;  Location: [place] [facility];  Service: Cardiovascular;  Laterality: Right;    Current Medications: Current Meds  Medication Sig  . beta carotene w/minerals (OCUVITE) tablet Take 1 tablet by mouth daily.  . Carboxymethylcellul-Glycerin (LUBRICATING EYE DROPS OP) Place 1 drop into both eyes daily as needed (Dry Eyes).  . cholecalciferol (VITAMIN D) 1000 units tablet Take 1,000 Units by mouth daily.   Marland Kitchen diltiazem (CARDIZEM CD) 180 MG 24 hr capsule Take 1 capsule (180 mg total) by mouth daily.  Marland Kitchen ELIQUIS 5 MG TABS tablet TAKE 1 TABLET(5 MG) BY MOUTH TWICE DAILY  . Misc Natural Products (OSTEO BI-FLEX TRIPLE STRENGTH PO) Take 1 tablet by mouth daily.  . montelukast (SINGULAIR) 10 MG tablet Take 10 mg by mouth at bedtime.   . traZODone (DESYREL) 50 MG tablet Take  0.5 tablets by mouth at bedtime as needed.  . vitamin C (ASCORBIC ACID) 500 MG tablet Take 500 mg by mouth daily.      Allergies:   Demerol [meperidine], Penicillins, Codeine, and Sulfa antibiotics   Social History   Socioeconomic History  . Marital status: Married    Spouse name: Not on file  . Number of children: Not on file  . Years of education: Not on file  . Highest education level: Not on file  Occupational History  . Not on file  Social Needs  . Financial resource strain: Not on file  . Food insecurity    Worry: Not on file    Inability: Not on file  . Transportation needs    Medical: Not on file    Non-medical: Not on file  Tobacco Use  . Smoking status: Former Smoker    Packs/day: 0.50    Types:  Cigarettes    Quit date: 10/07/2017    Years since quitting: 1.4  . Smokeless tobacco: Never Used  Substance and Sexual Activity  . Alcohol use: Yes    Comment: rare  . Drug use: No  . Sexual activity: Yes  Lifestyle  . Physical activity    Days per week: Not on file    Minutes per session: Not on file  . Stress: Not on file  Relationships  . Social Herbalist on phone: Not on file    Gets together: Not on file    Attends religious service: Not on file    Active member of club or organization: Not on file    Attends meetings of clubs or organizations: Not on file    Relationship status: Not on file  Other Topics Concern  . Not on file  Social History Narrative  . Not on file     Family History: The patient's family history includes Breast cancer in her mother.  ROS:   Please see the history of present illness.     All other systems reviewed and are negative.  EKGs/Labs/Other Studies Reviewed:    The following studies were reviewed today:   EKG: June 02, 2019: Normal sinus rhythm at 76 beats minute.  Marked sinus arrhythmia with frequent frequent PVCs.  Poor R wave progression-possible previous septal wall myocardial infarction  Recent Labs: 03/26/2019: ALT 12; BUN 15; Creatinine 0.81; Hemoglobin 13.5; Platelet Count 199; Potassium 3.9; Sodium 135  Recent Lipid Panel    Component Value Date/Time   CHOL 171 09/21/2017 1001   TRIG 117 09/21/2017 1001   HDL 53 09/21/2017 1001   CHOLHDL 3.2 09/21/2017 1001   VLDL 23 09/21/2017 1001   LDLCALC 95 09/21/2017 1001    Physical Exam: Blood pressure 118/68, pulse 76, height 5' (1.524 m), weight 142 lb 3.2 oz (64.5 kg), SpO2 98 %.  GEN:  Elderly female,  NAD  HEENT: Normal NECK: No JVD; No carotid bruits LYMPHATICS: No lymphadenopathy CARDIAC: RRR ,  Occasional premature beats. , no murmurs, rubs, gallops RESPIRATORY:  Clear to auscultation without rales, wheezing or rhonchi  ABDOMEN: Soft, non-tender,  non-distended MUSCULOSKELETAL:  No edema; No deformity  SKIN: Warm and dry NEUROLOGIC:  Alert and oriented x 3   ECG: April 02, 2019: Normal sinus rhythm at 76 with marked sinus arrhythmia.  Occasional premature ventricular contractions.  She has poor R wave progression.    ASSESSMENT:    1. Paroxysmal atrial fibrillation (HCC)    PLAN:    In order of problems  listed above:  1. Paroxsymal atrial fib :    currenlty in normal sinus rhythm.  2.  Mild coronary artery disease: Her EKG shows poor R wave progression suggestive of a septal myocardial infarction.  Heart catheterization in 2019 reveals only mild coronary artery irregularities.   Medication Adjustments/Labs and Tests Ordered: Current medicines are reviewed at length with the patient today.  Concerns regarding medicines are outlined above.  Orders Placed This Encounter  Procedures  . EKG 12-Lead   No orders of the defined types were placed in this encounter.  Patient Instructions  Medication Instructions:  Your physician recommends that you continue on your current medications as directed. Please refer to the Current Medication list given to you today.  If you need a refill on your cardiac medications before your next appointment, please call your pharmacy.   Lab work: None Ordered   Testing/Procedures: None Ordered   Follow-Up: At [facility], you and your health needs are our priority.  As part of our continuing mission to provide you with exceptional heart care, we have created designated Provider Care Teams.  These Care Teams include your primary Cardiologist (physician) and Advanced Practice Providers (APPs -  Physician Assistants and Nurse Practitioners) who all work together to provide you with the care you need, when you need it. You will need a follow up appointment in:  1 years.  Please call our office 2 months in advance to schedule this appointment.  You may see Mertie Moores, MD or one of the  following Advanced Practice Providers on your designated Care Team: Richardson Dopp, PA-C [place], [place] . Daune Perch, NP      Signed, Mertie Moores, MD  04/02/2019 1:56 PM    [place]

## 2019-04-02 ENCOUNTER — Encounter: Payer: Self-pay | Admitting: Cardiovascular Disease

## 2019-04-02 ENCOUNTER — Other Ambulatory Visit: Payer: Self-pay

## 2019-04-02 ENCOUNTER — Ambulatory Visit (INDEPENDENT_AMBULATORY_CARE_PROVIDER_SITE_OTHER): Payer: Medicare Other | Admitting: Cardiovascular Disease

## 2019-04-02 VITALS — BP 118/68 | HR 76 | Ht 60.0 in | Wt 142.2 lb

## 2019-04-02 DIAGNOSIS — I480 Paroxysmal atrial fibrillation: Secondary | ICD-10-CM

## 2019-04-02 NOTE — Patient Instructions (Signed)

## 2019-04-26 ENCOUNTER — Other Ambulatory Visit: Payer: Self-pay | Admitting: Cardiovascular Disease

## 2019-05-25 ENCOUNTER — Other Ambulatory Visit: Payer: Self-pay | Admitting: Family Medicine

## 2019-05-25 DIAGNOSIS — R634 Abnormal weight loss: Secondary | ICD-10-CM

## 2019-05-25 DIAGNOSIS — R1032 Left lower quadrant pain: Secondary | ICD-10-CM

## 2019-05-28 ENCOUNTER — Other Ambulatory Visit: Payer: Self-pay

## 2019-05-28 ENCOUNTER — Ambulatory Visit
Admission: RE | Admit: 2019-05-28 | Discharge: 2019-05-28 | Disposition: A | Discharge status: Home / Self Care | Payer: Medicare Other | Source: Ambulatory Visit | Attending: Family Medicine | Admitting: Family Medicine

## 2019-05-28 ENCOUNTER — Other Ambulatory Visit: Payer: Self-pay | Admitting: Family Medicine

## 2019-05-28 DIAGNOSIS — R634 Abnormal weight loss: Secondary | ICD-10-CM

## 2019-05-28 DIAGNOSIS — R1032 Left lower quadrant pain: Secondary | ICD-10-CM

## 2019-05-28 MED ORDER — IOPAMIDOL (ISOVUE-300) INJECTION 61%
100.0000 mL | Freq: Once | INTRAVENOUS | Status: AC | PRN
  Administered 2019-05-28: 100 mL via INTRAVENOUS

## 2019-07-26 ENCOUNTER — Other Ambulatory Visit: Payer: Self-pay | Admitting: Pharmacist

## 2019-07-26 MED ORDER — APIXABAN 5 MG PO TABS: 5.0000 mg | ORAL_TABLET | Freq: Two times a day (BID) | ORAL | 5 refills | Status: DC

## 2019-07-26 NOTE — Progress Notes (Signed)
Age 79, weight 64.5kg, SCr 0.81 on 03/26/19, afib indication, last OV Sept 2020

## 2019-08-18 ENCOUNTER — Telehealth: Payer: Self-pay | Admitting: *Deleted

## 2019-08-18 NOTE — Telephone Encounter (Signed)
   Primary Cardiologist: Mertie Moores, MD  Chart reviewed as part of pre-operative protocol coverage. Patient was contacted 08/18/2019 in reference to pre-operative risk assessment for pending surgery as outlined below.  Michelle Gonzales was last seen on 04/02/2019 by Dr. Acie Fredrickson.  Since that day, Michelle Gonzales has done well.   Therefore, based on ACC/AHA guidelines, the patient would be at acceptable risk for the planned procedure without further cardiovascular testing.   I will route this recommendation to the requesting party via Epic fax function and remove from pre-op pool.  Please call with questions.  [place], [place] 08/18/2019, 4:04 PM

## 2019-08-18 NOTE — Telephone Encounter (Signed)
   [facility] Pre-operative Risk Assessment    Request for surgical clearance:  1. What type of surgery is being performed? COLONOSCOPY/ENDOSCOPY   2. When is this surgery scheduled? 08/27/19   3. What type of clearance is required (medical clearance vs. Pharmacy clearance to hold med vs. Both)? BOTH  4. Are there any medications that need to be held prior to surgery and how long? ELIQUIS  5. Practice name and name of physician performing surgery? [facility]; DR. Paulita Fujita   6. What is your office phone number [phone]    7.   What is your office fax number [phone]  8.   Anesthesia type (None, local, MAC, general) ? NOT LISTED    Julaine Hua 08/18/2019, 9:16 AM  _________________________________________________________________   (provider comments below)

## 2019-08-18 NOTE — Telephone Encounter (Signed)
Left voice mail to call back 

## 2019-08-18 NOTE — Telephone Encounter (Signed)
Pt takes Eliquis for afib with CHADS2VASc score of 4 (age x2, sex, CAD). SCr 0.81, CrCl 109mL/min. Ok to hold Eliquis for 1-2 days prior to procedure.

## 2019-11-15 ENCOUNTER — Encounter (INDEPENDENT_AMBULATORY_CARE_PROVIDER_SITE_OTHER): Payer: Medicare Other | Admitting: Ophthalmology

## 2019-11-17 DIAGNOSIS — H353112 Nonexudative age-related macular degeneration, right eye, intermediate dry stage: Secondary | ICD-10-CM | POA: Insufficient documentation

## 2019-11-17 DIAGNOSIS — H35361 Drusen (degenerative) of macula, right eye: Secondary | ICD-10-CM | POA: Insufficient documentation

## 2019-11-17 DIAGNOSIS — H35051 Retinal neovascularization, unspecified, right eye: Secondary | ICD-10-CM | POA: Insufficient documentation

## 2019-11-17 DIAGNOSIS — H35363 Drusen (degenerative) of macula, bilateral: Secondary | ICD-10-CM | POA: Insufficient documentation

## 2019-11-17 DIAGNOSIS — H353211 Exudative age-related macular degeneration, right eye, with active choroidal neovascularization: Secondary | ICD-10-CM | POA: Insufficient documentation

## 2019-11-17 DIAGNOSIS — H43811 Vitreous degeneration, right eye: Secondary | ICD-10-CM | POA: Insufficient documentation

## 2019-11-17 DIAGNOSIS — H353122 Nonexudative age-related macular degeneration, left eye, intermediate dry stage: Secondary | ICD-10-CM | POA: Insufficient documentation

## 2019-11-17 DIAGNOSIS — H3570 Unspecified separation of retinal layers: Secondary | ICD-10-CM | POA: Insufficient documentation

## 2019-11-18 ENCOUNTER — Other Ambulatory Visit: Payer: Self-pay

## 2019-11-18 ENCOUNTER — Ambulatory Visit (INDEPENDENT_AMBULATORY_CARE_PROVIDER_SITE_OTHER): Payer: Medicare Other | Admitting: Ophthalmology

## 2019-11-18 ENCOUNTER — Encounter (INDEPENDENT_AMBULATORY_CARE_PROVIDER_SITE_OTHER): Payer: Self-pay | Admitting: Ophthalmology

## 2019-11-18 DIAGNOSIS — H35363 Drusen (degenerative) of macula, bilateral: Secondary | ICD-10-CM | POA: Diagnosis not present

## 2019-11-18 DIAGNOSIS — H353211 Exudative age-related macular degeneration, right eye, with active choroidal neovascularization: Secondary | ICD-10-CM | POA: Diagnosis not present

## 2019-11-18 DIAGNOSIS — H43811 Vitreous degeneration, right eye: Secondary | ICD-10-CM

## 2019-11-18 DIAGNOSIS — H35051 Retinal neovascularization, unspecified, right eye: Secondary | ICD-10-CM

## 2019-11-18 DIAGNOSIS — H353122 Nonexudative age-related macular degeneration, left eye, intermediate dry stage: Secondary | ICD-10-CM

## 2019-11-18 DIAGNOSIS — H3570 Unspecified separation of retinal layers: Secondary | ICD-10-CM

## 2019-11-18 DIAGNOSIS — H353112 Nonexudative age-related macular degeneration, right eye, intermediate dry stage: Secondary | ICD-10-CM

## 2019-11-18 DIAGNOSIS — H35361 Drusen (degenerative) of macula, right eye: Secondary | ICD-10-CM

## 2019-11-18 MED ORDER — BEVACIZUMAB CHEMO INJECTION 1.25MG/0.05ML SYRINGE FOR KALEIDOSCOPE
1.2500 mg | INTRAVITREAL | Status: AC | PRN
Start: 2019-11-18 — End: 2019-11-18
  Administered 2019-11-18: 1.25 mg via INTRAVITREAL

## 2019-11-18 NOTE — Assessment & Plan Note (Signed)
Examination today confirm stability CNVM with subretinal fluid.,  Repeat intravitreal Avastin today.  Examination in 6 weeks

## 2019-11-18 NOTE — Assessment & Plan Note (Signed)
The nature of posterior vitreous detachment was discussed with the patient as well as its physiology, its age prevalence, and its possible implication regarding retinal breaks and detachment.  An informational brochure was given to the patient.  All the patient's questions were answered.  The patient was asked to return if new or different flashes or floaters develops.   Patient was instructed to contact office immediately if any changes were noticed. I explained to the patient that vitreous inside the eye is similar to jello inside a bowl. As the jello melts it can start to pull away from the bowl, similarly the vitreous throughout our lives can begin to pull away from the retina. That process is called a posterior vitreous detachment. In some cases, the vitreous can tug hard enough on the retina to form a retinal tear. I discussed with the patient the signs and symptoms of a retinal detachment.  Do not rub the eye.  Stable OU

## 2019-11-18 NOTE — Assessment & Plan Note (Signed)
The nature of dry age related macular degeneration was discussed with the patient as well as its possible conversion to wet. The results of the AREDS 2 study was discussed with the patient. A diet rich in dark leafy green vegetables was advised and specific recommendations were made regarding supplements with AREDS 2 formulation . Control of hypertension and serum cholesterol may slow the disease. Smoking cessation is mandatory to slow the disease and diminish the risk of progressing to wet age related macular degeneration. The patient was instructed in the use of an [place] and was told to return immediately for any changes in the Grid. Stressed to the patient do not rub eyes OS with no change observed

## 2019-11-18 NOTE — Progress Notes (Signed)
11/18/2019     CHIEF COMPLAINT Patient presents for Retina Follow Up   HISTORY OF PRESENT ILLNESS: Michelle Gonzales is a 79 y.o. female who presents to the clinic today for:   HPI    Retina Follow Up    Patient presents with  Wet AMD.  In both eyes.  Duration of 6 weeks.  Since onset it is gradually improving.  I, the attending physician,  performed the HPI with the patient and updated documentation appropriately.          Comments    6 week AMD f\u OU. Possible Avastin OD. OCT  Pt feels OU vision is worse. Pt is having a hard time seeing the TV. Denies FOL and floaters.       Last edited by Tilda Franco on 11/18/2019  9:00 AM. (History)      Referring physician: [name], MD [place],  [place] [address]  HISTORICAL INFORMATION:   Selected notes from the MEDICAL RECORD NUMBER    Lab Results  Component Value Date   HGBA1C 5.7 (H) 09/21/2017     CURRENT MEDICATIONS: Current Outpatient Medications (Ophthalmic Drugs)  Medication Sig  . Carboxymethylcellul-Glycerin (LUBRICATING EYE DROPS OP) Place 1 drop into both eyes daily as needed (Dry Eyes).   No current facility-administered medications for this visit. (Ophthalmic Drugs)   Current Outpatient Medications (Other)  Medication Sig  . apixaban (ELIQUIS) 5 MG TABS tablet Take 1 tablet (5 mg total) by mouth 2 (two) times daily.  . beta carotene w/minerals (OCUVITE) tablet Take 1 tablet by mouth daily.  . cholecalciferol (VITAMIN D) 1000 units tablet Take 1,000 Units by mouth daily.   Marland Kitchen diltiazem (CARDIZEM CD) 180 MG 24 hr capsule TAKE 1 CAPSULE(180 MG) BY MOUTH DAILY  . Misc Natural Products (OSTEO BI-FLEX TRIPLE STRENGTH PO) Take 1 tablet by mouth daily.  . montelukast (SINGULAIR) 10 MG tablet Take 10 mg by mouth at bedtime.   . traZODone (DESYREL) 50 MG tablet Take 0.5 tablets by mouth at bedtime as needed.  . vitamin C (ASCORBIC ACID) 500 MG tablet Take 500 mg by mouth daily.     No current facility-administered medications for this visit. (Other)      REVIEW OF SYSTEMS:    ALLERGIES Allergies  Allergen Reactions  . Demerol [Meperidine] Nausea And Vomiting and Other (See Comments)    Bad headache ,  Nausea/Vomiting.  Marland Kitchen Penicillins     Has patient had a PCN reaction causing immediate rash, facial/tongue/throat swelling, SOB or lightheadedness with hypotension: YES Has patient had a PCN reaction causing severe rash involving mucus membranes or skin necrosis: NO Has patient had a PCN reaction that required hospitalization: NO Has patient had a PCN reaction occurring within the last 10 years: NO If all of the above answers are "NO", then may proceed with Cephalosporin use.   . Codeine Other (See Comments)    Massive headache  . Sulfa Antibiotics Itching and Rash    PAST MEDICAL HISTORY Past Medical History:  Diagnosis Date  . Allergy    Pcn, Sulfa, Codeine  . Anxiety   . Arthritis   . Atrial fibrillation ([place]) 09/22/2017  . Cataract    surgey b/l  . COPD (chronic obstructive pulmonary disease) ([place])   . Cough   . Depression    hx years ago  . Hot flashes   . Shortness of breath   . Sjogren's disease ([place])    Past  Surgical History:  Procedure Laterality Date  . ABDOMINAL HYSTERECTOMY  74   partial  . BREAST LUMPECTOMY WITH NEEDLE LOCALIZATION AND AXILLARY SENTINEL LYMPH NODE BX Right 02/01/2013   Procedure: BREAST LUMPECTOMY WITH NEEDLE LOCALIZATION AND AXILLARY SENTINEL LYMPH NODE BX;  Surgeon: Odis Hollingshead, MD;  Location: [facility];  Service: General;  Laterality: Right;  . BREAST SURGERY Right 1965   biopsy  . CERVICAL FUSION  2010  . CHOLECYSTECTOMY  2002  . COLONOSCOPY    . DILATION AND CURETTAGE OF UTERUS    . LEFT HEART CATH AND CORONARY ANGIOGRAPHY N/A 09/24/2017   Procedure: LEFT HEART CATH AND CORONARY ANGIOGRAPHY;  Surgeon: Leonie Man, MD;  Location: [facility];  Service: Cardiovascular;   Laterality: N/A;  . ULTRASOUND GUIDANCE FOR VASCULAR ACCESS Right 09/24/2017   Procedure: Ultrasound Guidance For Vascular Access;  Surgeon: Leonie Man, MD;  Location: [facility];  Service: Cardiovascular;  Laterality: Right;    FAMILY HISTORY Family History  Problem Relation Age of Onset  . Breast cancer Mother     SOCIAL HISTORY Social History   Tobacco Use  . Smoking status: Former Smoker    Packs/day: 0.50    Types: Cigarettes    Quit date: 10/07/2017    Years since quitting: 2.1  . Smokeless tobacco: Never Used  Substance Use Topics  . Alcohol use: Yes    Comment: rare  . Drug use: No         OPHTHALMIC EXAM: Base Eye Exam    Visual Acuity (Snellen - Linear)      Right Left   Dist [place] 20/40 20/60   Dist ph [place] 20/30       Tonometry (Tonopen, 9:05 AM)      Right Left   Pressure 12 15       Pupils      Pupils Dark Light Shape React APD   Right PERRL 4 3 Round Brisk None   Left PERRL 4 3 Round Brisk None       Visual Fields (Counting fingers)      Left Right    Full Full       Neuro/Psych    Mood/Affect: Normal       Dilation    Both eyes: 1.0% Mydriacyl, 2.5% Phenylephrine @ 9:06 AM          IMAGING AND PROCEDURES  Imaging and Procedures for 11/18/19           ASSESSMENT/PLAN:  No problem-specific Assessment & Plan notes found for this encounter.      ICD-10-CM   1. Exudative age-related macular degeneration of right eye with active choroidal neovascularization (HCC)  H35.3211 OCT, Retina - OU - Both Eyes  2. Choroidal neovascularization of right eye  H35.051 OCT, Retina - OU - Both Eyes  3. Degenerative retinal drusen of both eyes  H35.363 OCT, Retina - OU - Both Eyes  4. Retinal layer separation  H35.70   5. Intermediate stage nonexudative age-related macular degeneration of right eye  H35.3112   6. Intermediate stage nonexudative age-related macular degeneration of left eye  H35.3122   7. Posterior vitreous  detachment of right eye  H43.811   8. Degenerative retinal drusen of right eye  H35.361     1.  , Persistent subretinal fluid inferior to the foveal avascular zone OD overlying pigment epithelial detachment.  This region is stable at 6-week interval of examination, repeat intravitreal Avastin  today  2.  3.  Ophthalmic Meds Ordered this visit:  No orders of the defined types were placed in this encounter.      No follow-ups on file.  There are no Patient Instructions on file for this visit.   Explained the diagnoses, plan, and follow up with the patient and they expressed understanding.  Patient expressed understanding of the importance of proper follow up care.   Clent Demark Colie Fugitt M.D. Diseases & Surgery of the Retina and Vitreous Retina & Diabetic [place] [facility] 11/18/19     Abbreviations: M myopia (nearsighted); A astigmatism; H hyperopia (farsighted); P presbyopia; Mrx spectacle prescription;  CTL contact lenses; OD right eye; OS left eye; OU both eyes  XT exotropia; ET esotropia; PEK punctate epithelial keratitis; PEE punctate epithelial erosions; DES dry eye syndrome; MGD meibomian gland dysfunction; ATs artificial tears; PFAT's preservative free artificial tears; [place] nuclear sclerotic cataract; PSC posterior subcapsular cataract; ERM epi-retinal membrane; PVD posterior vitreous detachment; RD retinal detachment; DM diabetes mellitus; DR diabetic retinopathy; NPDR non-proliferative diabetic retinopathy; PDR proliferative diabetic retinopathy; CSME clinically significant macular edema; DME diabetic macular edema; dbh dot blot hemorrhages; CWS cotton wool spot; POAG primary open angle glaucoma; C/D cup-to-disc ratio; HVF humphrey visual field; GVF goldmann visual field; OCT optical coherence tomography; IOP intraocular pressure; BRVO Branch retinal vein occlusion; CRVO central retinal vein occlusion; CRAO central retinal artery occlusion; BRAO branch retinal artery occlusion; RT retinal  tear; SB scleral buckle; PPV pars plana vitrectomy; VH Vitreous hemorrhage; PRP panretinal laser photocoagulation; IVK intravitreal kenalog; VMT vitreomacular traction; MH Macular hole;  NVD neovascularization of the disc; NVE neovascularization elsewhere; AREDS age related eye disease study; ARMD age related macular degeneration; POAG primary open angle glaucoma; EBMD epithelial/anterior basement membrane dystrophy; ACIOL anterior chamber intraocular lens; IOL intraocular lens; PCIOL posterior chamber intraocular lens; Phaco/IOL phacoemulsification with intraocular lens placement; [place] photorefractive keratectomy; LASIK laser assisted in situ keratomileusis; HTN hypertension; DM diabetes mellitus; COPD chronic obstructive pulmonary disease

## 2019-11-24 ENCOUNTER — Inpatient Hospital Stay: Payer: Medicare Other | Admitting: Hematology

## 2019-11-24 ENCOUNTER — Inpatient Hospital Stay: Payer: Medicare Other

## 2019-11-24 ENCOUNTER — Telehealth: Payer: Self-pay | Admitting: Hematology

## 2019-11-24 NOTE — Telephone Encounter (Signed)
Scheduled appt per 5/5 sch message - left message with new appt date and time

## 2019-12-24 ENCOUNTER — Ambulatory Visit: Payer: Medicare Other | Admitting: Hematology

## 2019-12-24 ENCOUNTER — Other Ambulatory Visit: Payer: Medicare Other

## 2019-12-30 ENCOUNTER — Ambulatory Visit (INDEPENDENT_AMBULATORY_CARE_PROVIDER_SITE_OTHER): Payer: Medicare Other | Admitting: Ophthalmology

## 2019-12-30 ENCOUNTER — Other Ambulatory Visit: Payer: Self-pay

## 2019-12-30 ENCOUNTER — Encounter (INDEPENDENT_AMBULATORY_CARE_PROVIDER_SITE_OTHER): Payer: Self-pay | Admitting: Ophthalmology

## 2019-12-30 DIAGNOSIS — H353211 Exudative age-related macular degeneration, right eye, with active choroidal neovascularization: Secondary | ICD-10-CM

## 2019-12-30 MED ORDER — BEVACIZUMAB CHEMO INJECTION 1.25MG/0.05ML SYRINGE FOR KALEIDOSCOPE
1.2500 mg | INTRAVITREAL | Status: AC | PRN
  Administered 2019-12-30: 1.25 mg via INTRAVITREAL

## 2019-12-30 NOTE — Progress Notes (Signed)
12/30/2019     CHIEF COMPLAINT Patient presents for Retina Follow Up   HISTORY OF PRESENT ILLNESS: Michelle Gonzales is a 79 y.o. female who presents to the clinic today for:   HPI    Retina Follow Up    Patient presents with  Wet AMD.  In right eye.  Duration of 6 weeks.  Since onset it is stable.          Comments    6 week follow up- OCT OU, Poss Avastin OD Patient denies change in vision and overall has no complaints.        Last edited by Gerda Diss on 12/30/2019  9:09 AM. (History)      Referring physician: [name], MD [place],   [id number]  HISTORICAL INFORMATION:   Selected notes from the MEDICAL RECORD NUMBER    Lab Results  Component Value Date   HGBA1C 5.7 (H) 09/21/2017     CURRENT MEDICATIONS: Current Outpatient Medications (Ophthalmic Drugs)  Medication Sig  . Carboxymethylcellul-Glycerin (LUBRICATING EYE DROPS OP) Place 1 drop into both eyes daily as needed (Dry Eyes).   No current facility-administered medications for this visit. (Ophthalmic Drugs)   Current Outpatient Medications (Other)  Medication Sig  . apixaban (ELIQUIS) 5 MG TABS tablet Take 1 tablet (5 mg total) by mouth 2 (two) times daily.  . beta carotene w/minerals (OCUVITE) tablet Take 1 tablet by mouth daily.  . cholecalciferol (VITAMIN D) 1000 units tablet Take 1,000 Units by mouth daily.   Marland Kitchen diltiazem (CARDIZEM CD) 180 MG 24 hr capsule TAKE 1 CAPSULE(180 MG) BY MOUTH DAILY  . Misc Natural Products (OSTEO BI-FLEX TRIPLE STRENGTH PO) Take 1 tablet by mouth daily.  . montelukast (SINGULAIR) 10 MG tablet Take 10 mg by mouth at bedtime.   . traZODone (DESYREL) 50 MG tablet Take 0.5 tablets by mouth at bedtime as needed.  . vitamin C (ASCORBIC ACID) 500 MG tablet Take 500 mg by mouth daily.    No current facility-administered medications for this visit. (Other)      REVIEW OF SYSTEMS:    ALLERGIES Allergies  Allergen Reactions  .  Demerol [Meperidine] Nausea And Vomiting and Other (See Comments)    Bad headache ,  Nausea/Vomiting.  Marland Kitchen Penicillins     Has patient had a PCN reaction causing immediate rash, facial/tongue/throat swelling, SOB or lightheadedness with hypotension: YES Has patient had a PCN reaction causing severe rash involving mucus membranes or skin necrosis: NO Has patient had a PCN reaction that required hospitalization: NO Has patient had a PCN reaction occurring within the last 10 years: NO If all of the above answers are "NO", then may proceed with Cephalosporin use.   . Codeine Other (See Comments)    Massive headache  . Sulfa Antibiotics Itching and Rash    PAST MEDICAL HISTORY Past Medical History:  Diagnosis Date  . Allergy    Pcn, Sulfa, Codeine  . Anxiety   . Arthritis   . Atrial fibrillation ([place]) 09/22/2017  . Cataract    surgey b/l  . COPD (chronic obstructive pulmonary disease) ([place])   . Cough   . Depression    hx years ago  . Hot flashes   . Shortness of breath   . Sjogren's disease [facility])    Past Surgical History:  Procedure Laterality Date  . ABDOMINAL HYSTERECTOMY  74   partial  . BREAST LUMPECTOMY WITH NEEDLE LOCALIZATION AND AXILLARY SENTINEL LYMPH NODE  BX Right 02/01/2013   Procedure: BREAST LUMPECTOMY WITH NEEDLE LOCALIZATION AND AXILLARY SENTINEL LYMPH NODE BX;  Surgeon: Odis Hollingshead, MD;  Location: [facility];  Service: General;  Laterality: Right;  . BREAST SURGERY Right 1965   biopsy  . CERVICAL FUSION  2010  . CHOLECYSTECTOMY  2002  . COLONOSCOPY    . DILATION AND CURETTAGE OF UTERUS    . LEFT HEART CATH AND CORONARY ANGIOGRAPHY N/A 09/24/2017   Procedure: LEFT HEART CATH AND CORONARY ANGIOGRAPHY;  Surgeon: Leonie Man, MD;  Location: [facility];  Service: Cardiovascular;  Laterality: N/A;  . ULTRASOUND GUIDANCE FOR VASCULAR ACCESS Right 09/24/2017   Procedure: Ultrasound Guidance For Vascular Access;  Surgeon: Leonie Man,  MD;  Location: [facility];  Service: Cardiovascular;  Laterality: Right;    FAMILY HISTORY Family History  Problem Relation Age of Onset  . Breast cancer Mother     SOCIAL HISTORY Social History   Tobacco Use  . Smoking status: Former Smoker    Packs/day: 0.50    Types: Cigarettes    Quit date: 10/07/2017    Years since quitting: 2.2  . Smokeless tobacco: Never Used  Vaping Use  . Vaping Use: Never used  Substance Use Topics  . Alcohol use: Yes    Comment: rare  . Drug use: No         OPHTHALMIC EXAM:  Base Eye Exam    Visual Acuity (Snellen - Linear)      Right Left   Dist [place] 20/30-2 20/50-1   Dist ph [place] NI 20/30       Tonometry (Tonopen, 9:13 AM)      Right Left   Pressure 13 15       Pupils      Pupils Dark Light Shape React APD   Right PERRL 4 3 Round Slow None   Left PERRL 4 3 Round Slow None       Visual Fields (Counting fingers)      Left Right    Full Full       Extraocular Movement      Right Left    Full Full       Neuro/Psych    Oriented x3: Yes   Mood/Affect: Normal       Dilation    Right eye: 1.0% Mydriacyl, 2.5% Phenylephrine @ 9:13 AM        Slit Lamp and Fundus Exam    External Exam      Right Left   External Normal Normal       Slit Lamp Exam      Right Left   Lids/Lashes Normal Normal   Conjunctiva/Sclera White and quiet White and quiet   Cornea Clear Clear   Anterior Chamber Deep and quiet Deep and quiet   [place] Round and reactive Round and reactive   Lens Posterior chamber intraocular lens Posterior chamber intraocular lens   Anterior Vitreous Normal Normal       Fundus Exam      Right Left   Posterior Vitreous Posterior vitreous detachment    Disc Normal    C/D Ratio 0.3    Macula Subretinal neovascular membrane improved, Retinal pigment epithelial mottling, Less Macular thickening     Vessels Normal    Periphery Normal           IMAGING AND PROCEDURES  Imaging and Procedures for  12/30/19  OCT, Retina - OU - Both Eyes  Right Eye Quality was good. Scan locations included subfoveal. Central Foveal Thickness: 241. Progression has improved. Findings include retinal drusen , no SRF, no IRF, subretinal scarring.   Left Eye Quality was good. Scan locations included subfoveal. Central Foveal Thickness: 240. Progression has been stable. Findings include retinal drusen , no SRF, no IRF.   Notes Take with much less subretinal fluid over the pigment epithelial detachment.  Currently at 6-week interval repeat Avastin Avastin OD today                ASSESSMENT/PLAN:  Exudative age-related macular degeneration of right eye with active choroidal neovascularization (HCC) Take with much less subretinal fluid over the pigment epithelial detachment.  Currently at 6-week interval repeat Avastin Avastin OD today      ICD-10-CM   1. Exudative age-related macular degeneration of right eye with active choroidal neovascularization (HCC)  H35.3211 OCT, Retina - OU - Both Eyes    1.Take with much less subretinal fluid over the pigment epithelial detachment.  Currently at 6-week interval repeat Avastin Avastin OD today  2.  3.  Ophthalmic Meds Ordered this visit:  No orders of the defined types were placed in this encounter.      No follow-ups on file.  There are no Patient Instructions on file for this visit.   Explained the diagnoses, plan, and follow up with the patient and they expressed understanding.  Patient expressed understanding of the importance of proper follow up care.   Clent Demark Nai Borromeo M.D. Diseases & Surgery of the Retina and Vitreous Retina & Diabetic [place] 12/30/19     Abbreviations: M myopia (nearsighted); A astigmatism; H hyperopia (farsighted); P presbyopia; Mrx spectacle prescription;  CTL contact lenses; OD right eye; OS left eye; OU both eyes  XT exotropia; ET esotropia; PEK punctate epithelial keratitis; PEE punctate epithelial  erosions; DES dry eye syndrome; MGD meibomian gland dysfunction; ATs artificial tears; PFAT's preservative free artificial tears; [place] nuclear sclerotic cataract; PSC posterior subcapsular cataract; ERM epi-retinal membrane; PVD posterior vitreous detachment; RD retinal detachment; DM diabetes mellitus; DR diabetic retinopathy; NPDR non-proliferative diabetic retinopathy; PDR proliferative diabetic retinopathy; CSME clinically significant macular edema; DME diabetic macular edema; dbh dot blot hemorrhages; CWS cotton wool spot; POAG primary open angle glaucoma; C/D cup-to-disc ratio; HVF humphrey visual field; GVF goldmann visual field; OCT optical coherence tomography; IOP intraocular pressure; BRVO Branch retinal vein occlusion; CRVO central retinal vein occlusion; CRAO central retinal artery occlusion; BRAO branch retinal artery occlusion; RT retinal tear; SB scleral buckle; PPV pars plana vitrectomy; VH Vitreous hemorrhage; PRP panretinal laser photocoagulation; IVK intravitreal kenalog; VMT vitreomacular traction; MH Macular hole;  NVD neovascularization of the disc; NVE neovascularization elsewhere; AREDS age related eye disease study; ARMD age related macular degeneration; POAG primary open angle glaucoma; EBMD epithelial/anterior basement membrane dystrophy; ACIOL anterior chamber intraocular lens; IOL intraocular lens; PCIOL posterior chamber intraocular lens; Phaco/IOL phacoemulsification with intraocular lens placement; [place] photorefractive keratectomy; LASIK laser assisted in situ keratomileusis; HTN hypertension; DM diabetes mellitus; COPD chronic obstructive pulmonary disease

## 2019-12-30 NOTE — Progress Notes (Signed)
[facility] OFFICE PROGRESS NOTE  Michelle Frees, MD [address] [place] [address]  DIAGNOSIS: F/u of right breast cancer  Oncology History  Breast cancer of upper-outer quadrant of right female breast ([place])  01/06/2013 Initial Biopsy   Patient underwent a stereotactic biopsy of the 9:00 nodule. The pathology showed an invasive ductal carcinoma with ductal carcinoma in situ. The tumor was grade 2 with associated high-grade DCIS. There was ER positive PR positive HER-2/neu negative with a Ki-67 of 10%.    01/11/2013 Breast MRI    She had MRI of the breasts performed the MRI did not show any abnormal enhancement in the left breast. However in the right breast there was an 8 x 8 x 6 mm enhancing mass in the 9:00 region. There were post biopsy change is noted and biopsy clip was found   01/13/2013 Initial Diagnosis   Breast cancer of upper-outer quadrant of right female breast ([place])   12/2012 Mammogram   Patient was seen for a six-month recall mammogram for a suspicious right breast nodule. This was at the 9:00 position. Because of this she underwent a repeat diagnostic mammogram performed on 01/05/2013. There was a small nodule that persisted appearing to be most likely irregular and margination. She had an ultrasound that demonstrated a small 3-4 mm nodular density.   02/01/2013 Pathology Results   Surgery: Right breast lumpectomy  Diagnosis 1. Lymph node, sentinel, biopsy, Right axillary - THERE IS NO EVIDENCE OF CARCINOMA IN 1 OF 1 LYMPH NODE (0/1). 2. Breast, lumpectomy, Right - INVASIVE DUCTAL CARCINOMA, GRADE I/III, SPANNING 0.4 CM. - DUCTAL CARCINOMA IN SITU, LOW GRADE. - LOBULAR NEOPLASIA (ATYPICAL LOBULAR HYPERPLASIA). - THE SURGICAL RESECTION MARGINS ARE NEGATIVE FOR CARCINOMA. - SEE ONCOLOGY TABLE BELOW.   Tumor was ER +100% PR +80%. HER-2/neu negative with a Ki-67 of 50%.    02/24/2013 - 01/2018 Anti-estrogen oral therapy   Aromasin 27m  daily starting 02/24/2013, changed to tamoxifen in July 2017 due to muscular achiness. Stopped in 01/2018 due to depression.      CURRENT THERAPY: Surveillance  INTERVAL HISTORY: Michelle QUEST79y.o. female returns to the clinic for a follow up visit. The patient's tamoxifen was discontinued in 2019 due to worsening depression. The patient mentioned today that it also caused GI upset per patient report. The patient is feeling well today without any concerning complaints. She denies any fevers, chills, night sweats, unexplained weight loss, or lymphadenopathy. She denies any bone pain. Denies any cough or shortness of breath. Denies any abdominal pain or jaundice. Denies any breast masses, nipple discharge/inversion, or overlying skin changes. The patient had her last mammogram in September 2020. She is already scheduled for her annual mammogram in September 2021. She performs routine self breast exams. She does not have any concerning lesions today. Her last DEXA scan was 03/12/2020. She takes vitamin D and calcium. She does not have a follow up DEXA scan scheduled. She is here for evaluation and a routine follow up for her history of right sided breast cancer.   MEDICAL HISTORY: Past Medical History:  Diagnosis Date  . Allergy    Pcn, Sulfa, Codeine  . Anxiety   . Arthritis   . Atrial fibrillation ([facility] 09/22/2017  . Cataract    surgey b/l  . COPD (chronic obstructive pulmonary disease) ([place]   . Cough   . Depression    hx years ago  . Hot flashes   . Shortness of  breath   . Sjogren's disease ([place])     ALLERGIES:  is allergic to demerol [meperidine], penicillins, codeine, and sulfa antibiotics.  MEDICATIONS:  Current Outpatient Medications  Medication Sig Dispense Refill  . apixaban (ELIQUIS) 5 MG TABS tablet Take 1 tablet (5 mg total) by mouth 2 (two) times daily. 60 tablet 5  . beta carotene w/minerals (OCUVITE) tablet Take 1 tablet by mouth daily.    .  Carboxymethylcellul-Glycerin (LUBRICATING EYE DROPS OP) Place 1 drop into both eyes daily as needed ([place]).    . cholecalciferol (VITAMIN D) 1000 units tablet Take 1,000 Units by mouth daily.     Marland Kitchen diltiazem (CARDIZEM CD) 180 MG 24 hr capsule TAKE 1 CAPSULE(180 MG) BY MOUTH DAILY 90 capsule 3  . Misc Natural Products (OSTEO BI-FLEX TRIPLE STRENGTH PO) Take 1 tablet by mouth daily.    . montelukast (SINGULAIR) 10 MG tablet Take 10 mg by mouth at bedtime.     . traZODone (DESYREL) 50 MG tablet Take 0.5 tablets by mouth at bedtime as needed.    . vitamin C (ASCORBIC ACID) 500 MG tablet Take 500 mg by mouth daily.      No current facility-administered medications for this visit.    SURGICAL HISTORY:  Past Surgical History:  Procedure Laterality Date  . ABDOMINAL HYSTERECTOMY  74   partial  . BREAST LUMPECTOMY WITH NEEDLE LOCALIZATION AND AXILLARY SENTINEL LYMPH NODE BX Right 02/01/2013   Procedure: BREAST LUMPECTOMY WITH NEEDLE LOCALIZATION AND AXILLARY SENTINEL LYMPH NODE BX;  Surgeon: Odis Hollingshead, MD;  Location: [facility];  Service: General;  Laterality: Right;  . BREAST SURGERY Right 1965   biopsy  . CERVICAL FUSION  2010  . CHOLECYSTECTOMY  2002  . COLONOSCOPY    . DILATION AND CURETTAGE OF UTERUS    . LEFT HEART CATH AND CORONARY ANGIOGRAPHY N/A 09/24/2017   Procedure: LEFT HEART CATH AND CORONARY ANGIOGRAPHY;  Surgeon: Leonie Man, MD;  Location: [facility];  Service: Cardiovascular;  Laterality: N/A;  . ULTRASOUND GUIDANCE FOR VASCULAR ACCESS Right 09/24/2017   Procedure: Ultrasound Guidance For Vascular Access;  Surgeon: Leonie Man, MD;  Location: [facility];  Service: Cardiovascular;  Laterality: Right;    REVIEW OF SYSTEMS:   Review of Systems  Constitutional: Negative for appetite change, chills, fatigue, fever and unexpected weight change.  HENT: Negative for mouth sores, nosebleeds, sore throat and trouble swallowing.   Eyes:  Negative for eye problems and icterus.  Respiratory: Negative for cough, hemoptysis, shortness of breath and wheezing.   Cardiovascular: Negative for chest pain and leg swelling.  Gastrointestinal: Negative for abdominal pain, constipation, diarrhea, nausea and vomiting.  Genitourinary: Negative for bladder incontinence, difficulty urinating, dysuria, frequency and hematuria.   Musculoskeletal: Negative for back pain, gait problem, neck pain and neck stiffness.  Skin: Negative for itching and rash.  Neurological: Negative for dizziness, extremity weakness, gait problem, headaches, light-headedness and seizures.  Hematological: Negative for adenopathy. Does not bruise/bleed easily.  Psychiatric/Behavioral: Negative for confusion, depression and sleep disturbance. The patient is not nervous/anxious.     PHYSICAL EXAMINATION:  Blood pressure 120/71, pulse 81, temperature 97.7 F (36.5 C), temperature source Temporal, resp. rate 18, height 5' (1.524 m), weight 140 lb 6.4 oz (63.7 kg), SpO2 99 %.  ECOG PERFORMANCE STATUS: 1 - Symptomatic but completely ambulatory  Physical Exam  Constitutional: Oriented to person, place, and time and well-developed, well-nourished, and in no distress.  HENT:  Head: Normocephalic and atraumatic.  Mouth/Throat: Oropharynx is clear and moist. No oropharyngeal exudate.  Eyes: Conjunctivae are normal. Right eye exhibits no discharge. Left eye exhibits no discharge. No scleral icterus.  Neck: Normal range of motion. Neck supple.  Cardiovascular: Normal rate, regular rhythm, normal heart sounds and intact distal pulses.   Pulmonary/Chest: Effort normal and breath sounds normal. No respiratory distress. No wheezes. No rales.  Abdominal: Soft. Bowel sounds are normal. Exhibits no distension and no mass. There is no tenderness.  Musculoskeletal: Normal range of motion. Exhibits no edema.  Lymphadenopathy:    No cervical adenopathy.  Neurological: Alert and oriented  to person, place, and time. Exhibits normal muscle tone. Gait normal. Coordination normal.  Skin: Skin is warm and dry. No rash noted. Not diaphoretic. No erythema. No pallor.  Psychiatric: Mood, memory and judgment normal.  Vitals reviewed. BREAST: Chaperone Natalie (CNA) present. S/p right lumpectomy: Surgical incision healed well with mild scar tissue. No palpable mass, nodules or adenopathy bilaterally. Breast exam benign.   LABORATORY DATA: Lab Results  Component Value Date   WBC 8.3 03/26/2019   HGB 13.5 03/26/2019   HCT 40.8 03/26/2019   MCV 90.9 03/26/2019   PLT 199 03/26/2019      Chemistry      Component Value Date/Time   NA 135 03/26/2019 1234   NA 139 04/21/2017 1136   K 3.9 03/26/2019 1234   K 4.3 04/21/2017 1136   CL 99 03/26/2019 1234   CL 100 01/13/2013 0819   CO2 26 03/26/2019 1234   CO2 26 04/21/2017 1136   BUN 15 03/26/2019 1234   BUN 12.4 04/21/2017 1136   CREATININE 0.81 03/26/2019 1234   CREATININE 0.8 04/21/2017 1136      Component Value Date/Time   CALCIUM 8.4 (L) 03/26/2019 1234   CALCIUM 9.2 04/21/2017 1136   ALKPHOS 111 03/26/2019 1234   ALKPHOS 71 04/21/2017 1136   AST 16 03/26/2019 1234   AST 18 04/21/2017 1136   ALT 12 03/26/2019 1234   ALT 13 04/21/2017 1136   BILITOT 0.4 03/26/2019 1234   BILITOT 0.41 04/21/2017 1136       RADIOGRAPHIC STUDIES:  Intravitreal Injection, Pharmacologic Agent - OD - Right Eye  Result Date: 12/30/2019 Time Out 12/30/2019. 9:53 AM. Confirmed correct patient, procedure, site, and patient consented. Anesthesia Topical anesthesia was used. Anesthetic medications included Akten 3.5%. Procedure Preparation included Ofloxacin , 10% betadine to eyelids, 5% betadine to ocular surface. A 30 gauge needle was used. Injection: 1.25 mg Bevacizumab (AVASTIN) SOLN   NDC: [address]-2, Lot: [id number]   Route: Intravitreal, Site: Right Eye, Waste: 0 mg Post-op Post injection exam found visual acuity of at least counting fingers.  The patient tolerated the procedure well. There were no complications. The patient received written and verbal post procedure care education. Post injection medications were not given.   OCT, Retina - OU - Both Eyes  Result Date: 12/30/2019 Right Eye Quality was good. Scan locations included subfoveal. Central Foveal Thickness: 241. Progression has improved. Findings include retinal drusen , no SRF, no IRF, subretinal scarring. Left Eye Quality was good. Scan locations included subfoveal. Central Foveal Thickness: 240. Progression has been stable. Findings include retinal drusen , no SRF, no IRF. Notes Take with much less subretinal fluid over the pigment epithelial detachment.  Currently at 6-week interval repeat Avastin Avastin OD today    ASSESSMENT/PLAN:  Michelle Gonzales is a 79 y.o. female with   1. Breast cancer of upper outer  quadrant of right breast, invasive ductal carcinoma, [id number], stage IA, ER positive PR positive HER-2/neu negative. -She was diagnosed in 12/2012. She is s/p right lumpectomy.  -She was started on Aromasin in 02/2013 and she took it for 3 years. She switched to Tamoxifen in 7/2017due to the diffuse muscular achiness.She is tolerating tamoxifen very well, Dr. Burr Medico recommended her to have it for 5 yearstotal. -She stopped Tamoxifen in 01/2018 due to depression after stopping her depression resolved.  -She is clinically doing well. Her physical exam and her 03/2019 mammogram were unremarkable. There is no clinical concern for recurrence. -Continue Surveillance. Next mammogram in 03/2020 -I given that the patient is >5 years out from diagnosis as well as the fact she is no longer on anti-estrogen therapy, I gave the patient to continue to follow with our clinic annually for surveillance or to follow with her PCP. The patient already follows up with her PCP regularly. She opted to continue to be followed by her PCP for surveillance. However, she knows that we are here if she  develops any concerns or complaints in the future.   2. Bone health -She had bone density scan in August 2016 which was normal -Dr. Burr Medico encouraged her to continue taking calcium and vitamin Ddaily (which she is taking), and regular exercise. -DEXA from 03/12/2017 was normalat [facility]  -Will arrange for next DEXA scan September 2021 at [facility].   3. Insomnia  -She has had ongoing issues with waking up in the middle of the night and being unable to fall back asleep. Dr. Burr Medico urged her to try taking OTC Melatonin to help with this. Dr. Burr Medico alsopreviouslyinformed her to decrease her caffeine intake in the evenings, decrease her life stressors, and to rest in the hours leading up to going to bed.  -Shewillf/u w/ her PCP   4.Chronic medical issues she'll continue follow-up with her primary care physician for other medical problems.  5.Chest painand Afib  -She went to the ED and was admitted to the hospital on 09/21/17 with complaints of centralized chest pain. Her coronary angiography showed no significantstenosis. -Mali vasc score is 3 -She is now being followed by cardiologist Dr. Madelin Rear was started on cardizem.  -Dr. Burr Medico previously discussed that antiestrogen therapy slightly increases risk of cardiovascular disease. She is no longer on anti-estrogen therapy at this point in time.   PLAN:  -Mammogram 03/2020 -DEXA scan 03/2020 -Patient given the option and chose to follow with PCP for surveillance. We are always available if she develops any concerns for reoccurrence in the future.    Orders Placed This Encounter  Procedures  . DG Bone Density    Please schedule in September 2021 when the patient is getting her mammogram as well.    Standing Status:   Future    Standing Expiration Date:   12/30/2020    Scheduling Instructions:     Please schedule in September 2021 when the patient is getting her mammogram as well.    Order Specific Question:   Reason for Exam (SYMPTOM   OR DIAGNOSIS REQUIRED)    Answer:   post menopausal, hx tamoxifen use.    Order Specific Question:   Preferred imaging location?    Answer:   External    Comments:   [place], PA-C 12/31/19

## 2019-12-30 NOTE — Assessment & Plan Note (Signed)
Take with much less subretinal fluid over the pigment epithelial detachment.  Currently at 6-week interval repeat Avastin Avastin OD today

## 2019-12-31 ENCOUNTER — Other Ambulatory Visit: Payer: Self-pay

## 2019-12-31 ENCOUNTER — Telehealth: Payer: Self-pay

## 2019-12-31 ENCOUNTER — Inpatient Hospital Stay: Payer: Medicare Other | Attending: Physician Assistant | Admitting: Physician Assistant

## 2019-12-31 ENCOUNTER — Inpatient Hospital Stay: Payer: Medicare Other

## 2019-12-31 VITALS — BP 120/71 | HR 81 | Temp 97.7°F | Resp 18 | Ht 60.0 in | Wt 140.4 lb

## 2019-12-31 DIAGNOSIS — Z853 Personal history of malignant neoplasm of breast: Secondary | ICD-10-CM | POA: Diagnosis not present

## 2019-12-31 DIAGNOSIS — G4700 Insomnia, unspecified: Secondary | ICD-10-CM | POA: Diagnosis not present

## 2019-12-31 DIAGNOSIS — I4891 Unspecified atrial fibrillation: Secondary | ICD-10-CM | POA: Diagnosis not present

## 2019-12-31 DIAGNOSIS — Z170 Estrogen receptor positive status [ER+]: Secondary | ICD-10-CM | POA: Diagnosis not present

## 2019-12-31 DIAGNOSIS — C50411 Malignant neoplasm of upper-outer quadrant of right female breast: Secondary | ICD-10-CM | POA: Diagnosis not present

## 2019-12-31 NOTE — Telephone Encounter (Signed)
Order for Bone Density Scan faxed to [facility] at [phone]. Confirmation of fax received.

## 2020-01-11 ENCOUNTER — Other Ambulatory Visit: Payer: Self-pay | Admitting: *Deleted

## 2020-01-11 MED ORDER — APIXABAN 5 MG PO TABS: 5.0000 mg | ORAL_TABLET | Freq: Two times a day (BID) | ORAL | 5 refills | Status: DC

## 2020-01-11 NOTE — Telephone Encounter (Signed)
Eliquis 5mg  refill request received. Patient is 79 years old, weight-63.7kg, Crea-0.81 on 03/26/2019, Diagnosis-Afib, and last seen by Dr. Acie Fredrickson on 04/02/2019. Dose is appropriate based on dosing criteria. Will send in refill to requested pharmacy.

## 2020-02-10 ENCOUNTER — Ambulatory Visit (INDEPENDENT_AMBULATORY_CARE_PROVIDER_SITE_OTHER): Payer: Medicare Other | Admitting: Ophthalmology

## 2020-02-10 ENCOUNTER — Other Ambulatory Visit: Payer: Self-pay

## 2020-02-10 ENCOUNTER — Encounter (INDEPENDENT_AMBULATORY_CARE_PROVIDER_SITE_OTHER): Payer: Self-pay | Admitting: Ophthalmology

## 2020-02-10 DIAGNOSIS — H353211 Exudative age-related macular degeneration, right eye, with active choroidal neovascularization: Secondary | ICD-10-CM | POA: Diagnosis not present

## 2020-02-10 MED ORDER — BEVACIZUMAB CHEMO INJECTION 1.25MG/0.05ML SYRINGE FOR KALEIDOSCOPE
1.2500 mg | INTRAVITREAL | Status: AC | PRN
  Administered 2020-02-10: 1.25 mg via INTRAVITREAL

## 2020-02-10 NOTE — Progress Notes (Signed)
02/10/2020     CHIEF COMPLAINT Patient presents for Retina Follow Up   HISTORY OF PRESENT ILLNESS: Michelle Gonzales is a 79 y.o. female who presents to the clinic today for:   HPI    Retina Follow Up    Patient presents with  Wet AMD.  In right eye.  This started 6 weeks ago.  Severity is mild.  Duration of 6 weeks.  Since onset it is stable.          Comments    6 Week AMD F/U OU, poss Avastin OD  Pt denies noticeable changes to [place] OU since last visit. Pt denies ocular pain, flashes of light, or floaters OU.         Last edited by Rockie Neighbours, [place] on 02/10/2020  8:35 AM. (History)      Referring physician: [name], MD [address],  [place] [id number]  HISTORICAL INFORMATION:   Selected notes from the MEDICAL RECORD NUMBER    Lab Results  Component Value Date   HGBA1C 5.7 (H) 09/21/2017     CURRENT MEDICATIONS: Current Outpatient Medications (Ophthalmic Drugs)  Medication Sig  . Carboxymethylcellul-Glycerin (LUBRICATING EYE DROPS OP) Place 1 drop into both eyes daily as needed (Dry Eyes).   No current facility-administered medications for this visit. (Ophthalmic Drugs)   Current Outpatient Medications (Other)  Medication Sig  . apixaban (ELIQUIS) 5 MG TABS tablet Take 1 tablet (5 mg total) by mouth 2 (two) times daily.  . beta carotene w/minerals (OCUVITE) tablet Take 1 tablet by mouth daily.  . cholecalciferol (VITAMIN D) 1000 units tablet Take 1,000 Units by mouth daily.   Marland Kitchen diltiazem (CARDIZEM CD) 180 MG 24 hr capsule TAKE 1 CAPSULE(180 MG) BY MOUTH DAILY  . Misc Natural Products (OSTEO BI-FLEX TRIPLE STRENGTH PO) Take 1 tablet by mouth daily.  . montelukast (SINGULAIR) 10 MG tablet Take 10 mg by mouth at bedtime.   . traZODone (DESYREL) 50 MG tablet Take 0.5 tablets by mouth at bedtime as needed.  . vitamin C (ASCORBIC ACID) 500 MG tablet Take 500 mg by mouth daily.    No current facility-administered medications for this  visit. (Other)      REVIEW OF SYSTEMS:    ALLERGIES Allergies  Allergen Reactions  . Demerol [Meperidine] Nausea And Vomiting and Other (See Comments)    Bad headache ,  Nausea/Vomiting.  Marland Kitchen Penicillins     Has patient had a PCN reaction causing immediate rash, facial/tongue/throat swelling, SOB or lightheadedness with hypotension: YES Has patient had a PCN reaction causing severe rash involving mucus membranes or skin necrosis: NO Has patient had a PCN reaction that required hospitalization: NO Has patient had a PCN reaction occurring within the last 10 years: NO If all of the above answers are "NO", then may proceed with Cephalosporin use.   . Codeine Other (See Comments)    Massive headache  . Sulfa Antibiotics Itching and Rash    PAST MEDICAL HISTORY Past Medical History:  Diagnosis Date  . Allergy    Pcn, Sulfa, Codeine  . Anxiety   . Arthritis   . Atrial fibrillation ([place]) 09/22/2017  . Cataract    surgey b/l  . COPD (chronic obstructive pulmonary disease) ([place])   . Cough   . Depression    hx years ago  . Hot flashes   . Shortness of breath   . Sjogren's disease [facility])    Past Surgical History:  Procedure Laterality  Date  . ABDOMINAL HYSTERECTOMY  74   partial  . BREAST LUMPECTOMY WITH NEEDLE LOCALIZATION AND AXILLARY SENTINEL LYMPH NODE BX Right 02/01/2013   Procedure: BREAST LUMPECTOMY WITH NEEDLE LOCALIZATION AND AXILLARY SENTINEL LYMPH NODE BX;  Surgeon: Odis Hollingshead, MD;  Location: [facility];  Service: General;  Laterality: Right;  . BREAST SURGERY Right 1965   biopsy  . CERVICAL FUSION  2010  . CHOLECYSTECTOMY  2002  . COLONOSCOPY    . DILATION AND CURETTAGE OF UTERUS    . LEFT HEART CATH AND CORONARY ANGIOGRAPHY N/A 09/24/2017   Procedure: LEFT HEART CATH AND CORONARY ANGIOGRAPHY;  Surgeon: Leonie Man, MD;  Location: [place] [facility];  Service: Cardiovascular;  Laterality: N/A;  . ULTRASOUND GUIDANCE FOR VASCULAR ACCESS  Right 09/24/2017   Procedure: Ultrasound Guidance For Vascular Access;  Surgeon: Leonie Man, MD;  Location: [facility];  Service: Cardiovascular;  Laterality: Right;    FAMILY HISTORY Family History  Problem Relation Age of Onset  . Breast cancer Mother     SOCIAL HISTORY Social History   Tobacco Use  . Smoking status: Former Smoker    Packs/day: 0.50    Types: Cigarettes    Quit date: 10/07/2017    Years since quitting: 2.3  . Smokeless tobacco: Never Used  Vaping Use  . Vaping Use: Never used  Substance Use Topics  . Alcohol use: Yes    Comment: rare  . Drug use: No         OPHTHALMIC EXAM:  Base Eye Exam    Visual Acuity (ETDRS)      Right Left   Dist [place] 20/40 -1 20/50 +2   Dist ph [place] 20/30 -2 20/25 -2       Tonometry (Tonopen, 8:39 AM)      Right Left   Pressure 11 13       Pupils      Pupils Dark Light Shape React APD   Right PERRL 4 3 Round Brisk None   Left PERRL 4 3 Round Brisk None       Visual Fields (Counting fingers)      Left Right    Full Full       Extraocular Movement      Right Left    Full Full       Neuro/Psych    Oriented x3: Yes   Mood/Affect: Normal       Dilation    Both eyes: 1.0% Mydriacyl, 2.5% Phenylephrine @ 8:39 AM        Slit Lamp and Fundus Exam    External Exam      Right Left   External Normal Normal       Slit Lamp Exam      Right Left   Lids/Lashes Normal Normal   Conjunctiva/Sclera White and quiet White and quiet   Cornea Clear Clear   Anterior Chamber Deep and quiet Deep and quiet   Iris Round and reactive Round and reactive   Lens Posterior chamber intraocular lens Posterior chamber intraocular lens   Anterior Vitreous Normal Normal       Fundus Exam      Right Left   Posterior Vitreous Posterior vitreous detachment    Disc Normal    C/D Ratio 0.3    Macula Subretinal neovascular membrane improved, Retinal pigment epithelial mottling, Less Macular thickening     Vessels  Normal    Periphery Normal  IMAGING AND PROCEDURES  Imaging and Procedures for 02/10/20  OCT, Retina - OU - Both Eyes       Right Eye Quality was good. Scan locations included subfoveal. Central Foveal Thickness: 258. Progression has been stable. Findings include abnormal foveal contour, retinal drusen , subretinal fluid, choroidal neovascular membrane, pigment epithelial detachment.   Left Eye Quality was good. Scan locations included subfoveal. Central Foveal Thickness: 242. Progression has been stable. Findings include retinal drusen .   Notes OD, chronic subretinal fluid well controlled inferior to the foveal region, Subfoveal region with a pigment epithelial detachment, controlled disease process.  Repeat intravitreal Avastin OD today and examination in 5 weeks       Intravitreal Injection, Pharmacologic Agent - OD - Right Eye       Time Out 02/10/2020. 9:17 AM. Confirmed correct patient, procedure, site, and patient consented.   Anesthesia Topical anesthesia was used. Anesthetic medications included [place] 3.5%.   Procedure Preparation included 10% betadine to eyelids, 5% betadine to ocular surface, Tobramycin 0.3%. A 30 gauge needle was used.   Injection:  1.25 mg Bevacizumab (AVASTIN) SOLN   NDC: [address]-1, Lot: [id number]   Route: Intravitreal, Site: Right Eye, Waste: 0 mg  Post-op Post injection exam found visual acuity of at least counting fingers. The patient tolerated the procedure well. There were no complications. The patient received written and verbal post procedure care education. Post injection medications were not given.                 ASSESSMENT/PLAN:  Exudative age-related macular degeneration of right eye with active choroidal neovascularization (HCC) The nature of wet macular degeneration was discussed with the patient.  Forms of therapy reviewed include the use of Anti-VEGF medications injected painlessly into the eye, as well as  other possible treatment modalities, including thermal laser therapy. Fellow eye involvement and risks were discussed with the patient. Upon the finding of wet age related macular degeneration, treatment will be offered. The treatment regimen is on a treat as needed basis with the intent to treat if necessary and extend interval of exams when possible. On average 1 out of 6 patients do not need lifetime therapy. However, the risk of recurrent disease is high for a lifetime.  Initially monthly, then periodic, examinations and evaluations will determine whether the next treatment is required on the day of the examination.  OD, improved and stable at 6-week interval today will repeat examination in 5 to 6 weeks right eye      ICD-10-CM   1. Exudative age-related macular degeneration of right eye with active choroidal neovascularization (HCC)  H35.3211 OCT, Retina - OU - Both Eyes    Intravitreal Injection, Pharmacologic Agent - OD - Right Eye    Bevacizumab (AVASTIN) SOLN 1.25 mg    1.  OD condition stable on intravitreal Avastin every 6 weeks.  We will repeat injection today and again in 6 weeks  2.  3.  Ophthalmic Meds Ordered this visit:  Meds ordered this encounter  Medications  . Bevacizumab (AVASTIN) SOLN 1.25 mg       Return in about 6 weeks (around 03/23/2020) for dilate, OD, AVASTIN OCT.  There are no Patient Instructions on file for this visit.   Explained the diagnoses, plan, and follow up with the patient and they expressed understanding.  Patient expressed understanding of the importance of proper follow up care.   Clent Demark Mohammed Mcandrew M.D. Diseases & Surgery of the Retina and Vitreous Retina & Diabetic  [place] 02/10/20     Abbreviations: M myopia (nearsighted); A astigmatism; H hyperopia (farsighted); P presbyopia; Mrx spectacle prescription;  CTL contact lenses; OD right eye; OS left eye; OU both eyes  XT exotropia; ET esotropia; PEK punctate epithelial keratitis; PEE  punctate epithelial erosions; DES dry eye syndrome; MGD meibomian gland dysfunction; ATs artificial tears; PFAT's preservative free artificial tears; [place] nuclear sclerotic cataract; PSC posterior subcapsular cataract; ERM epi-retinal membrane; PVD posterior vitreous detachment; RD retinal detachment; DM diabetes mellitus; DR diabetic retinopathy; NPDR non-proliferative diabetic retinopathy; PDR proliferative diabetic retinopathy; CSME clinically significant macular edema; DME diabetic macular edema; dbh dot blot hemorrhages; CWS cotton wool spot; POAG primary open angle glaucoma; C/D cup-to-disc ratio; HVF humphrey visual field; GVF goldmann visual field; OCT optical coherence tomography; IOP intraocular pressure; BRVO Branch retinal vein occlusion; CRVO central retinal vein occlusion; CRAO central retinal artery occlusion; BRAO branch retinal artery occlusion; RT retinal tear; SB scleral buckle; PPV pars plana vitrectomy; VH Vitreous hemorrhage; PRP panretinal laser photocoagulation; IVK intravitreal kenalog; VMT vitreomacular traction; MH Macular hole;  NVD neovascularization of the disc; NVE neovascularization elsewhere; AREDS age related eye disease study; ARMD age related macular degeneration; POAG primary open angle glaucoma; EBMD epithelial/anterior basement membrane dystrophy; ACIOL anterior chamber intraocular lens; IOL intraocular lens; PCIOL posterior chamber intraocular lens; Phaco/IOL phacoemulsification with intraocular lens placement; [place] photorefractive keratectomy; LASIK laser assisted in situ keratomileusis; HTN hypertension; DM diabetes mellitus; COPD chronic obstructive pulmonary disease

## 2020-02-10 NOTE — Assessment & Plan Note (Signed)
The nature of wet macular degeneration was discussed with the patient.  Forms of therapy reviewed include the use of Anti-VEGF medications injected painlessly into the eye, as well as other possible treatment modalities, including thermal laser therapy. Fellow eye involvement and risks were discussed with the patient. Upon the finding of wet age related macular degeneration, treatment will be offered. The treatment regimen is on a treat as needed basis with the intent to treat if necessary and extend interval of exams when possible. On average 1 out of 6 patients do not need lifetime therapy. However, the risk of recurrent disease is high for a lifetime.  Initially monthly, then periodic, examinations and evaluations will determine whether the next treatment is required on the day of the examination.  OD, improved and stable at 6-week interval today will repeat examination in 5 to 6 weeks right eye

## 2020-03-23 ENCOUNTER — Encounter (INDEPENDENT_AMBULATORY_CARE_PROVIDER_SITE_OTHER): Payer: Medicare Other | Admitting: Ophthalmology

## 2020-03-30 ENCOUNTER — Encounter (INDEPENDENT_AMBULATORY_CARE_PROVIDER_SITE_OTHER): Payer: Self-pay | Admitting: Ophthalmology

## 2020-03-30 ENCOUNTER — Ambulatory Visit (INDEPENDENT_AMBULATORY_CARE_PROVIDER_SITE_OTHER): Payer: Medicare Other | Admitting: Ophthalmology

## 2020-03-30 ENCOUNTER — Other Ambulatory Visit: Payer: Self-pay

## 2020-03-30 DIAGNOSIS — H353122 Nonexudative age-related macular degeneration, left eye, intermediate dry stage: Secondary | ICD-10-CM | POA: Diagnosis not present

## 2020-03-30 DIAGNOSIS — H353211 Exudative age-related macular degeneration, right eye, with active choroidal neovascularization: Secondary | ICD-10-CM | POA: Diagnosis not present

## 2020-03-30 MED ORDER — BEVACIZUMAB CHEMO INJECTION 1.25MG/0.05ML SYRINGE FOR KALEIDOSCOPE
1.2500 mg | INTRAVITREAL | Status: AC | PRN
  Administered 2020-03-30: 1.25 mg via INTRAVITREAL

## 2020-03-30 NOTE — Assessment & Plan Note (Signed)
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  Now well controlled and not progressive at 6-week interval.  Goal will be to prevent further progression.  Repeat injection Avastin today and examination in 6 weeks

## 2020-03-30 NOTE — Patient Instructions (Signed)
Patient should report any new onset visual acuity distortion or decline in either eye

## 2020-03-30 NOTE — Assessment & Plan Note (Signed)

## 2020-03-30 NOTE — Progress Notes (Signed)
03/30/2020     CHIEF COMPLAINT Patient presents for Retina Follow Up   HISTORY OF PRESENT ILLNESS: Michelle Gonzales is a 79 y.o. female who presents to the clinic today for:   HPI    Retina Follow Up    Patient presents with  Wet AMD.  In right eye.  Severity is moderate.  Duration of 7 weeks.  Since onset it is stable.  I, the attending physician,  performed the HPI with the patient and updated documentation appropriately.          Comments    7 Week Wet AMD f\u OD. Possible Avastin OD. OCT  Pt states no changes in vision. Pt sees occasional floaters but they usually go away.       Last edited by Tilda Franco on 03/30/2020  8:45 AM. (History)      Referring physician: [name], MD [address] [place],  [place] [id number]  HISTORICAL INFORMATION:   Selected notes from the MEDICAL RECORD NUMBER    Lab Results  Component Value Date   HGBA1C 5.7 (H) 09/21/2017     CURRENT MEDICATIONS: Current Outpatient Medications (Ophthalmic Drugs)  Medication Sig  . Carboxymethylcellul-Glycerin (LUBRICATING EYE DROPS OP) Place 1 drop into both eyes daily as needed (Dry Eyes).   No current facility-administered medications for this visit. (Ophthalmic Drugs)   Current Outpatient Medications (Other)  Medication Sig  . apixaban (ELIQUIS) 5 MG TABS tablet Take 1 tablet (5 mg total) by mouth 2 (two) times daily.  . beta carotene w/minerals (OCUVITE) tablet Take 1 tablet by mouth daily.  . cholecalciferol (VITAMIN D) 1000 units tablet Take 1,000 Units by mouth daily.   Marland Kitchen diltiazem (CARDIZEM CD) 180 MG 24 hr capsule TAKE 1 CAPSULE(180 MG) BY MOUTH DAILY  . Misc Natural Products (OSTEO BI-FLEX TRIPLE STRENGTH PO) Take 1 tablet by mouth daily.  . montelukast (SINGULAIR) 10 MG tablet Take 10 mg by mouth at bedtime.   . traZODone (DESYREL) 50 MG tablet Take 0.5 tablets by mouth at bedtime as needed.  . vitamin C (ASCORBIC ACID) 500 MG tablet Take 500 mg by mouth  daily.    No current facility-administered medications for this visit. (Other)      REVIEW OF SYSTEMS:    ALLERGIES Allergies  Allergen Reactions  . Demerol [Meperidine] Nausea And Vomiting and Other (See Comments)    Bad headache ,  Nausea/Vomiting.  Marland Kitchen Penicillins     Has patient had a PCN reaction causing immediate rash, facial/tongue/throat swelling, SOB or lightheadedness with hypotension: YES Has patient had a PCN reaction causing severe rash involving mucus membranes or skin necrosis: NO Has patient had a PCN reaction that required hospitalization: NO Has patient had a PCN reaction occurring within the last 10 years: NO If all of the above answers are "NO", then may proceed with Cephalosporin use.   . Codeine Other (See Comments)    Massive headache  . Sulfa Antibiotics Itching and Rash    PAST MEDICAL HISTORY Past Medical History:  Diagnosis Date  . Allergy    Pcn, Sulfa, Codeine  . Anxiety   . Arthritis   . Atrial fibrillation ([facility]) 09/22/2017  . Cataract    surgey b/l  . COPD (chronic obstructive pulmonary disease) ([facility])   . Cough   . Depression    hx years ago  . Hot flashes   . Shortness of breath   . Sjogren's disease ([facility])    Past  Surgical History:  Procedure Laterality Date  . ABDOMINAL HYSTERECTOMY  73   partial  . BREAST LUMPECTOMY WITH NEEDLE LOCALIZATION AND AXILLARY SENTINEL LYMPH NODE BX Right 02/01/2013   Procedure: BREAST LUMPECTOMY WITH NEEDLE LOCALIZATION AND AXILLARY SENTINEL LYMPH NODE BX;  Surgeon: Odis Hollingshead, MD;  Location: [facility];  Service: General;  Laterality: Right;  . BREAST SURGERY Right 1965   biopsy  . CERVICAL FUSION  2010  . CHOLECYSTECTOMY  2002  . COLONOSCOPY    . DILATION AND CURETTAGE OF UTERUS    . LEFT HEART CATH AND CORONARY ANGIOGRAPHY N/A 09/24/2017   Procedure: LEFT HEART CATH AND CORONARY ANGIOGRAPHY;  Surgeon: Leonie Man, MD;  Location: [facility];  Service:  Cardiovascular;  Laterality: N/A;  . ULTRASOUND GUIDANCE FOR VASCULAR ACCESS Right 09/24/2017   Procedure: Ultrasound Guidance For Vascular Access;  Surgeon: Leonie Man, MD;  Location: [facility];  Service: Cardiovascular;  Laterality: Right;    FAMILY HISTORY Family History  Problem Relation Age of Onset  . Breast cancer Mother     SOCIAL HISTORY Social History   Tobacco Use  . Smoking status: Former Smoker    Packs/day: 0.50    Types: Cigarettes    Quit date: 10/07/2017    Years since quitting: 2.4  . Smokeless tobacco: Never Used  Vaping Use  . Vaping Use: Never used  Substance Use Topics  . Alcohol use: Yes    Comment: rare  . Drug use: No         OPHTHALMIC EXAM:  Base Eye Exam    Visual Acuity (Snellen - Linear)      Right Left   Dist [place] 20/50 20/60   Dist ph [place] 20/40 -2 20/40       Tonometry (Tonopen, 8:49 AM)      Right Left   Pressure 15 15       Pupils      Dark Light Shape React APD   Right 4 3 Round Slow None   Left 4 3 Round Slow None       Visual Fields (Counting fingers)      Left Right    Full Full       Neuro/Psych    Oriented x3: Yes   Mood/Affect: Normal       Dilation    Right eye: 1.0% Mydriacyl, 2.5% Phenylephrine @ 8:49 AM        Slit Lamp and Fundus Exam    External Exam      Right Left   External Normal Normal       Slit Lamp Exam      Right Left   Lids/Lashes Normal Normal   Conjunctiva/Sclera White and quiet White and quiet   Cornea Clear Clear   Anterior Chamber Deep and quiet Deep and quiet   Iris Round and reactive Round and reactive   Lens Posterior chamber intraocular lens Posterior chamber intraocular lens   Anterior Vitreous Normal Normal       Fundus Exam      Right Left   Posterior Vitreous Posterior vitreous detachment    Disc Normal    C/D Ratio 0.3    Macula Subretinal neovascular membrane improved, Retinal pigment epithelial mottling,  stable Macular thickening     Vessels  Normal    Periphery Normal           IMAGING AND PROCEDURES  Imaging and Procedures for 03/30/20  OCT,  Retina - OU - Both Eyes       Right Eye Quality was good. Scan locations included subfoveal. Central Foveal Thickness: 235. Progression has been stable. Findings include abnormal foveal contour, retinal drusen , subretinal fluid, choroidal neovascular membrane, pigment epithelial detachment.   Left Eye Quality was good. Scan locations included subfoveal. Central Foveal Thickness: 235. Progression has been stable. Findings include retinal drusen .   Notes OD, chronic subretinal fluid well controlled inferior to the foveal region, Subfoveal region with a pigment epithelial detachment, controlled disease proces at 6-week follow-up currently.  Repeat intravitreal Avastin OD today and examination in 6 weeks       Intravitreal Injection, Pharmacologic Agent - OD - Right Eye       Time Out 03/30/2020. 9:25 AM. Confirmed correct patient, procedure, site, and patient consented.   Anesthesia Topical anesthesia was used. Anesthetic medications included Akten 3.5%.   Procedure Preparation included 10% betadine to eyelids, 5% betadine to ocular surface, Tobramycin 0.3%. A supplied needle was used.   Injection:  1.25 mg Bevacizumab (AVASTIN) SOLN   NDC: [address]-3, Lot: [id number]   Route: Intravitreal, Site: Right Eye, Waste: 0 mg  Post-op Post injection exam found visual acuity of at least counting fingers. The patient tolerated the procedure well. There were no complications. The patient received written and verbal post procedure care education. Post injection medications were not given.                 ASSESSMENT/PLAN:  Exudative age-related macular degeneration of right eye with active choroidal neovascularization (HCC) Wet ARMD, chronically active with serous retinal detachment inferior to the fovea, overlying a pigment epithelial detachment.  Now well controlled and not  progressive at 6-week interval.  Goal will be to prevent further progression.  Repeat injection Avastin today and examination in 6 weeks  Intermediate stage nonexudative age-related macular degeneration of left eye The nature of age--related macular degeneration was discussed with the patient as well as the distinction between dry and wet types. Checking an Amsler Grid daily with advice to return immediately should a distortion develop, was given to the patient. The patient 's smoking status now and in the past was determined and advice based on the AREDS study was provided regarding the consumption of antioxidant supplements. AREDS 2 vitamin formulation was recommended. Consumption of dark leafy vegetables and fresh fruits of various colors was recommended. Treatment modalities for wet macular degeneration particularly the use of intravitreal injections of anti-blood vessel growth factors was discussed with the patient. Avastin, Lucentis, and Eylea are the available options. On occasion, therapy includes the use of photodynamic therapy and thermal laser. Stressed to the patient do not rub eyes.  Patient was advised to check Amsler Grid daily and return immediately if changes are noted. Instructions on using the grid were given to the patient. All patient questions were answered.      ICD-10-CM   1. Exudative age-related macular degeneration of right eye with active choroidal neovascularization (HCC)  H35.3211 OCT, Retina - OU - Both Eyes    Intravitreal Injection, Pharmacologic Agent - OD - Right Eye    Bevacizumab (AVASTIN) SOLN 1.25 mg  2. Intermediate stage nonexudative age-related macular degeneration of left eye  H35.3122     1.  Controlled vascularized pigment material detachment with serous retinal detachment overlying inferior to the fovea OD, at 6-week interval post intravitreal Avastin.  2.  Repeat intravitreal Avastin OD today and examination in 6 weeks.  3.  Ophthalmic Meds  Ordered  this visit:  Meds ordered this encounter  Medications  . Bevacizumab (AVASTIN) SOLN 1.25 mg       Return in about 6 weeks (around 05/11/2020) for dilate, OD, AVASTIN OCT.  Patient Instructions  Patient should report any new onset visual acuity distortion or decline in either eye    Explained the diagnoses, plan, and follow up with the patient and they expressed understanding.  Patient expressed understanding of the importance of proper follow up care.   Clent Demark Michon Kaczmarek M.D. Diseases & Surgery of the Retina and Vitreous [facility] 03/30/20     Abbreviations: M myopia (nearsighted); A astigmatism; H hyperopia (farsighted); P presbyopia; Mrx spectacle prescription;  CTL contact lenses; OD right eye; OS left eye; OU both eyes  XT exotropia; ET esotropia; PEK punctate epithelial keratitis; PEE punctate epithelial erosions; DES dry eye syndrome; MGD meibomian gland dysfunction; ATs artificial tears; PFAT's preservative free artificial tears; [place] nuclear sclerotic cataract; PSC posterior subcapsular cataract; ERM epi-retinal membrane; PVD posterior vitreous detachment; RD retinal detachment; DM diabetes mellitus; DR diabetic retinopathy; NPDR non-proliferative diabetic retinopathy; PDR proliferative diabetic retinopathy; CSME clinically significant macular edema; DME diabetic macular edema; dbh dot blot hemorrhages; CWS cotton wool spot; POAG primary open angle glaucoma; C/D cup-to-disc ratio; HVF humphrey visual field; GVF goldmann visual field; OCT optical coherence tomography; IOP intraocular pressure; BRVO Branch retinal vein occlusion; CRVO central retinal vein occlusion; CRAO central retinal artery occlusion; BRAO branch retinal artery occlusion; RT retinal tear; SB scleral buckle; PPV pars plana vitrectomy; VH Vitreous hemorrhage; PRP panretinal laser photocoagulation; IVK intravitreal kenalog; VMT vitreomacular traction; MH Macular hole;  NVD neovascularization of the  disc; NVE neovascularization elsewhere; AREDS age related eye disease study; ARMD age related macular degeneration; POAG primary open angle glaucoma; EBMD epithelial/anterior basement membrane dystrophy; ACIOL anterior chamber intraocular lens; IOL intraocular lens; PCIOL posterior chamber intraocular lens; Phaco/IOL phacoemulsification with intraocular lens placement; [place] photorefractive keratectomy; LASIK laser assisted in situ keratomileusis; HTN hypertension; DM diabetes mellitus; COPD chronic obstructive pulmonary disease

## 2020-04-02 ENCOUNTER — Encounter: Payer: Self-pay | Admitting: Cardiovascular Disease

## 2020-04-02 NOTE — Progress Notes (Signed)
Cardiology Office Note:    Date:  04/03/2020   ID:  Michelle Gonzales, DOB Jan 08, 1941, MRN [id number]  PCP:  [name], MD  Cardiologist:  Mertie Moores, MD  Electrophysiologist:  None   Referring MD: [name], MD   Problem list 1.  Atrial fibrillation -paroxysmal 2.  Breast cancer 3.  History of chest pain-heart catheterization in March, 2019 showed minimal CAD    Chief Complaint  Patient presents with  . Atrial Fibrillation     Michelle Gonzales is a 79 y.o. female with a hx of atrial fibrillation.  She has been seen by Pecolia Ades, nurse practitioner.  I met her in the hospital in March, 2019. Has started back smoking  ( her husband continued to smoke inside the house )   Had PAF on the event monitor She cannot tell when she is in A. fib versus sinus rhythm.  She denies any chest pain, shortness of breath, or passing out.  Has not been exercising .    Sept. 11, 2020:  Doing well.  No cp or dyspnea.    Walks outside on occasion.   cannot go to the gym  Cannot feel when she is in atrial fib In on Eliquis. CBC and  BMP have been stable  Sept. 13, 2021:  Sharnae is seen today for follow up of her paroxysmal  atrial fib. Is on eliquis  Has lots of mucus production related to the Diltizem  These may be allergies Will DC and try metoprolol    Past Medical History:  Diagnosis Date  . Allergy    Pcn, Sulfa, Codeine  . Anxiety   . Arthritis   . Atrial fibrillation ([facility]) 09/22/2017  . Cataract    surgey b/l  . COPD (chronic obstructive pulmonary disease) ([facility])   . Cough   . Depression    hx years ago  . Hot flashes   . Shortness of breath   . Sjogren's disease [facility])     Past Surgical History:  Procedure Laterality Date  . ABDOMINAL HYSTERECTOMY  74   partial  . BREAST LUMPECTOMY WITH NEEDLE LOCALIZATION AND AXILLARY SENTINEL LYMPH NODE BX Right 02/01/2013   Procedure: BREAST LUMPECTOMY WITH NEEDLE LOCALIZATION AND AXILLARY SENTINEL LYMPH NODE BX;   Surgeon: Odis Hollingshead, MD;  Location: [facility];  Service: General;  Laterality: Right;  . BREAST SURGERY Right 1965   biopsy  . CERVICAL FUSION  2010  . CHOLECYSTECTOMY  2002  . COLONOSCOPY    . DILATION AND CURETTAGE OF UTERUS    . LEFT HEART CATH AND CORONARY ANGIOGRAPHY N/A 09/24/2017   Procedure: LEFT HEART CATH AND CORONARY ANGIOGRAPHY;  Surgeon: Leonie Man, MD;  Location: [facility];  Service: Cardiovascular;  Laterality: N/A;  . ULTRASOUND GUIDANCE FOR VASCULAR ACCESS Right 09/24/2017   Procedure: Ultrasound Guidance For Vascular Access;  Surgeon: Leonie Man, MD;  Location: [facility];  Service: Cardiovascular;  Laterality: Right;    Current Medications: Current Meds  Medication Sig  . apixaban (ELIQUIS) 5 MG TABS tablet Take 1 tablet (5 mg total) by mouth 2 (two) times daily.  . beta carotene w/minerals (OCUVITE) tablet Take 1 tablet by mouth daily.  . Carboxymethylcellul-Glycerin (LUBRICATING EYE DROPS OP) Place 1 drop into both eyes daily as needed (Dry Eyes).  . cholecalciferol (VITAMIN D) 1000 units tablet Take 1,000 Units by mouth daily.   . Misc Natural Products (OSTEO BI-FLEX TRIPLE STRENGTH PO) Take  1 tablet by mouth daily.  . montelukast (SINGULAIR) 10 MG tablet Take 10 mg by mouth at bedtime.   . sertraline (ZOLOFT) 50 MG tablet Take 50 mg by mouth daily.  . traZODone (DESYREL) 50 MG tablet Take 0.5 tablets by mouth at bedtime as needed.  . vitamin C (ASCORBIC ACID) 500 MG tablet Take 500 mg by mouth daily.   . [DISCONTINUED] diltiazem (CARDIZEM CD) 180 MG 24 hr capsule TAKE 1 CAPSULE(180 MG) BY MOUTH DAILY     Allergies:   Demerol [meperidine], Penicillins, Codeine, and Sulfa antibiotics   Social History   Socioeconomic History  . Marital status: Married    Spouse name: Not on file  . Number of children: Not on file  . Years of education: Not on file  . Highest education level: Not on file  Occupational History  .  Not on file  Tobacco Use  . Smoking status: Former Smoker    Packs/day: 0.50    Types: Cigarettes    Quit date: 10/07/2017    Years since quitting: 2.4  . Smokeless tobacco: Never Used  Vaping Use  . Vaping Use: Never used  Substance and Sexual Activity  . Alcohol use: Yes    Comment: rare  . Drug use: No  . Sexual activity: Yes  Other Topics Concern  . Not on file  Social History Narrative  . Not on file   Social Determinants of Health   Financial Resource Strain:   . Difficulty of Paying Living Expenses: Not on file  Food Insecurity:   . Worried About Charity fundraiser in the Last Year: Not on file  . Ran Out of Food in the Last Year: Not on file  Transportation Needs:   . Lack of Transportation (Medical): Not on file  . Lack of Transportation (Non-Medical): Not on file  Physical Activity:   . Days of Exercise per Week: Not on file  . Minutes of Exercise per Session: Not on file  Stress:   . Feeling of Stress : Not on file  Social Connections:   . Frequency of Communication with Friends and Family: Not on file  . Frequency of Social Gatherings with Friends and Family: Not on file  . Attends Religious Services: Not on file  . Active Member of Clubs or Organizations: Not on file  . Attends Archivist Meetings: Not on file  . Marital Status: Not on file     Family History: The patient's family history includes Breast cancer in her mother.  ROS:   Please see the history of present illness.     All other systems reviewed and are negative.  EKGs/Labs/Other Studies Reviewed:    The following studies were reviewed today:   EKG: June 02, 2019: Normal sinus rhythm at 76 beats minute.  Marked sinus arrhythmia with frequent frequent PVCs.  Poor R wave progression-possible previous septal wall myocardial infarction  Recent Labs: No results found for requested labs within last 8760 hours.  Recent Lipid Panel    Component Value Date/Time   CHOL 171  09/21/2017 1001   TRIG 117 09/21/2017 1001   HDL 53 09/21/2017 1001   CHOLHDL 3.2 09/21/2017 1001   VLDL 23 09/21/2017 1001   LDLCALC 95 09/21/2017 1001    Physical Exam: Blood pressure 124/74, pulse 82, height 5' (1.524 m), weight 143 lb (64.9 kg), SpO2 96 %.  GEN:  Well nourished, well developed in no acute distress HEENT: Normal NECK: No JVD; No carotid  bruits LYMPHATICS: No lymphadenopathy CARDIAC: RRR , no murmurs, rubs, gallops RESPIRATORY:  Clear to auscultation without rales, wheezing or rhonchi  ABDOMEN: Soft, non-tender, non-distended MUSCULOSKELETAL:  No edema; No deformity  SKIN: Warm and dry NEUROLOGIC:  Alert and oriented x 3    ECG: April 03, 2020: Normal sinus rhythm at 79.  No ST or T wave changes.    ASSESSMENT:    1. Paroxysmal atrial fibrillation (HCC)    PLAN:    In order of problems listed above:  1. Paroxsymal atrial fib :    She remains in normal sinus rhythm.  She seems to be feeling well but has lots of mucus production and a cough.  She thinks it might be associated with the diltiazem.  We will discontinue the diltiazem and start her on metoprolol 25 mg twice a day.  We will see if this helps.  If it does not make a difference then I have given her the option of going back on diltiazem or staying with metoprolol.  I 2.  Mild coronary artery disease: She has mild coronary artery disease.  She is not having any episodes of angina.   will have her see an APP in 6 months.  I will plan on seeing her in 1 year.     Medication Adjustments/Labs and Tests Ordered: Current medicines are reviewed at length with the patient today.  Concerns regarding medicines are outlined above.  Orders Placed This Encounter  Procedures  . Basic metabolic panel  . CBC  . EKG 12-Lead   Meds ordered this encounter  Medications  . metoprolol tartrate (LOPRESSOR) 25 MG tablet    Sig: Take 1 tablet (25 mg total) by mouth 2 (two) times daily.    Dispense:  180  tablet    Refill:  3    Patient Instructions  Medication Instructions:  Your physician has recommended you make the following change in your medication:   1. STOP: diltiazem  2. START: metoprolol tartrate (lopressor) 25 mg tablet: Take 1 tablet by mouth twice a day   *If you need a refill on your cardiac medications before your next appointment, please call your pharmacy*   Lab Work: TODAY: CBC, BMET  If you have labs (blood work) drawn today and your tests are completely normal, you will receive your results only by: Marland Kitchen MyChart Message (if you have MyChart) OR . A paper copy in the mail If you have any lab test that is abnormal or we need to change your treatment, we will call you to review the results.   Testing/Procedures: None  Follow-Up: At [facility], you and your health needs are our priority.  As part of our continuing mission to provide you with exceptional heart care, we have created designated Provider Care Teams.  These Care Teams include your primary Cardiologist (physician) and Advanced Practice Providers (APPs -  Physician Assistants and Nurse Practitioners) who all work together to provide you with the care you need, when you need it.  We recommend signing up for the patient portal called "MyChart".  Sign up information is provided on this After Visit Summary.  MyChart is used to connect with patients for Virtual Visits (Telemedicine).  Patients are able to view lab/test results, encounter notes, upcoming appointments, etc.  Non-urgent messages can be sent to your provider as well.   To learn more about what you can do with MyChart, go to [email or web address].    Your next appointment:   6 month(s)  The format for your next appointment:   In Person  Provider:   Richardson Dopp, PA-C or Robbie Lis, PA-C  Your next appointment:   12 month(s)  The format for your next appointment:   In Person  Provider:   Mertie Moores, MD   Other  Instructions None     Signed, Mertie Moores, MD  04/03/2020 3:51 PM    [place]

## 2020-04-03 ENCOUNTER — Other Ambulatory Visit: Payer: Self-pay

## 2020-04-03 ENCOUNTER — Encounter: Payer: Self-pay | Admitting: Cardiovascular Disease

## 2020-04-03 ENCOUNTER — Ambulatory Visit: Payer: Medicare Other | Admitting: Cardiovascular Disease

## 2020-04-03 VITALS — BP 124/74 | HR 82 | Ht 60.0 in | Wt 143.0 lb

## 2020-04-03 DIAGNOSIS — I480 Paroxysmal atrial fibrillation: Secondary | ICD-10-CM | POA: Diagnosis not present

## 2020-04-03 MED ORDER — METOPROLOL TARTRATE 25 MG PO TABS
25.0000 mg | ORAL_TABLET | Freq: Two times a day (BID) | ORAL | 3 refills | Status: DC
Start: 2020-04-03 — End: 2020-11-20

## 2020-04-03 NOTE — Patient Instructions (Signed)
Medication Instructions:  Your physician has recommended you make the following change in your medication:   1. STOP: diltiazem  2. START: metoprolol tartrate (lopressor) 25 mg tablet: Take 1 tablet by mouth twice a day   *If you need a refill on your cardiac medications before your next appointment, please call your pharmacy*   Lab Work: TODAY: CBC, BMET  If you have labs (blood work) drawn today and your tests are completely normal, you will receive your results only by: Marland Kitchen MyChart Message (if you have MyChart) OR . A paper copy in the mail If you have any lab test that is abnormal or we need to change your treatment, we will call you to review the results.   Testing/Procedures: None  Follow-Up: At [facility], you and your health needs are our priority.  As part of our continuing mission to provide you with exceptional heart care, we have created designated Provider Care Teams.  These Care Teams include your primary Cardiologist (physician) and Advanced Practice Providers (APPs -  Physician Assistants and Nurse Practitioners) who all work together to provide you with the care you need, when you need it.  We recommend signing up for the patient portal called "MyChart".  Sign up information is provided on this After Visit Summary.  MyChart is used to connect with patients for Virtual Visits (Telemedicine).  Patients are able to view lab/test results, encounter notes, upcoming appointments, etc.  Non-urgent messages can be sent to your provider as well.   To learn more about what you can do with MyChart, go to [email or web address].    Your next appointment:   6 month(s)  The format for your next appointment:   In Person  Provider:   Richardson Dopp, PA-C or Robbie Lis, PA-C  Your next appointment:   12 month(s)  The format for your next appointment:   In Person  Provider:   Mertie Moores, MD   Other Instructions None

## 2020-04-04 LAB — CBC
Hematocrit: 39.1 % (ref 34.0–46.6)
Hemoglobin: 13.4 g/dL (ref 11.1–15.9)
MCH: 30.2 pg (ref 26.6–33.0)
MCHC: 34.3 g/dL (ref 31.5–35.7)
MCV: 88 fL (ref 79–97)
Platelets: 124 10*3/uL — ABNORMAL LOW (ref 150–450)
RBC: 4.44 x10E6/uL (ref 3.77–5.28)
RDW: 12.1 % (ref 11.7–15.4)
WBC: 8.7 10*3/uL (ref 3.4–10.8)

## 2020-04-04 LAB — BASIC METABOLIC PANEL
BUN/Creatinine Ratio: 14 (ref 12–28)
BUN: 11 mg/dL (ref 8–27)
CO2: 26 mmol/L (ref 20–29)
Calcium: 9.1 mg/dL (ref 8.7–10.3)
Chloride: 99 mmol/L (ref 96–106)
Creatinine, Ser: 0.77 mg/dL (ref 0.57–1.00)
GFR calc Af Amer: 86 mL/min/{1.73_m2} (ref >59)
GFR calc non Af Amer: 74 mL/min/{1.73_m2} (ref >59)
Glucose: 83 mg/dL (ref 65–99)
Potassium: 4.6 mmol/L (ref 3.5–5.2)
Sodium: 136 mmol/L (ref 134–144)

## 2020-05-11 ENCOUNTER — Other Ambulatory Visit: Payer: Self-pay

## 2020-05-11 ENCOUNTER — Encounter (INDEPENDENT_AMBULATORY_CARE_PROVIDER_SITE_OTHER): Payer: Self-pay | Admitting: Ophthalmology

## 2020-05-11 ENCOUNTER — Ambulatory Visit (INDEPENDENT_AMBULATORY_CARE_PROVIDER_SITE_OTHER): Payer: Medicare Other | Admitting: Ophthalmology

## 2020-05-11 DIAGNOSIS — H00022 Hordeolum internum right lower eyelid: Secondary | ICD-10-CM | POA: Diagnosis not present

## 2020-05-11 DIAGNOSIS — H353211 Exudative age-related macular degeneration, right eye, with active choroidal neovascularization: Secondary | ICD-10-CM | POA: Diagnosis not present

## 2020-05-11 HISTORY — DX: Hordeolum internum right lower eyelid: H00.022

## 2020-05-11 MED ORDER — BACITRACIN-POLYMYXIN B 500-10000 UNIT/GM OP OINT
TOPICAL_OINTMENT | Freq: Three times a day (TID) | OPHTHALMIC | 2 refills | Status: AC
Start: 2020-05-11 — End: 2020-05-25

## 2020-05-11 NOTE — Assessment & Plan Note (Signed)
OD, with active infection of the eyelid, will need to treat this and delay delivery of medication today

## 2020-05-11 NOTE — Progress Notes (Signed)
05/11/2020     CHIEF COMPLAINT Patient presents for Retina Follow Up   HISTORY OF PRESENT ILLNESS: Michelle Gonzales is a 79 y.o. female who presents to the clinic today for:   HPI    Retina Follow Up    Patient presents with  Wet AMD.  In right eye.  This started 6 weeks ago.  Severity is mild.  Duration of 6 weeks.  Since onset it is stable.          Comments    6 Week AMD F/U OD, poss Avastin OD  Pt denies noticeable changes to [place] OU since last visit. Pt denies FOL or floaters OU. Pt c/o stye LL OD x 1 week, but sts "it's not as big as it was."       Last edited by Rockie Neighbours, [place] on 05/11/2020 10:01 AM. (History)      Referring physician: [name], MD [place],  [place] [address]  HISTORICAL INFORMATION:   Selected notes from the MEDICAL RECORD NUMBER    Lab Results  Component Value Date   HGBA1C 5.7 (H) 09/21/2017     CURRENT MEDICATIONS: Current Outpatient Medications (Ophthalmic Drugs)  Medication Sig  . Carboxymethylcellul-Glycerin (LUBRICATING EYE DROPS OP) Place 1 drop into both eyes daily as needed (Dry Eyes).   No current facility-administered medications for this visit. (Ophthalmic Drugs)   Current Outpatient Medications (Other)  Medication Sig  . apixaban (ELIQUIS) 5 MG TABS tablet Take 1 tablet (5 mg total) by mouth 2 (two) times daily.  . beta carotene w/minerals (OCUVITE) tablet Take 1 tablet by mouth daily.  . cholecalciferol (VITAMIN D) 1000 units tablet Take 1,000 Units by mouth daily.   . metoprolol tartrate (LOPRESSOR) 25 MG tablet Take 1 tablet (25 mg total) by mouth 2 (two) times daily.  . Misc Natural Products (OSTEO BI-FLEX TRIPLE STRENGTH PO) Take 1 tablet by mouth daily.  . montelukast (SINGULAIR) 10 MG tablet Take 10 mg by mouth at bedtime.   . sertraline (ZOLOFT) 50 MG tablet Take 50 mg by mouth daily.  . traZODone (DESYREL) 50 MG tablet Take 0.5 tablets by mouth at bedtime as needed.  . vitamin  C (ASCORBIC ACID) 500 MG tablet Take 500 mg by mouth daily.    No current facility-administered medications for this visit. (Other)      REVIEW OF SYSTEMS:    ALLERGIES Allergies  Allergen Reactions  . Demerol [Meperidine] Nausea And Vomiting and Other (See Comments)    Bad headache ,  Nausea/Vomiting.  Marland Kitchen Penicillins     Has patient had a PCN reaction causing immediate rash, facial/tongue/throat swelling, SOB or lightheadedness with hypotension: YES Has patient had a PCN reaction causing severe rash involving mucus membranes or skin necrosis: NO Has patient had a PCN reaction that required hospitalization: NO Has patient had a PCN reaction occurring within the last 10 years: NO If all of the above answers are "NO", then may proceed with Cephalosporin use.   . Codeine Other (See Comments)    Massive headache  . Sulfa Antibiotics Itching and Rash    PAST MEDICAL HISTORY Past Medical History:  Diagnosis Date  . Allergy    Pcn, Sulfa, Codeine  . Anxiety   . Arthritis   . Atrial fibrillation ([place]) 09/22/2017  . Cataract    surgey b/l  . COPD (chronic obstructive pulmonary disease) ([place])   . Cough   . Depression    hx years  ago  . Hot flashes   . Shortness of breath   . Sjogren's disease [facility])    Past Surgical History:  Procedure Laterality Date  . ABDOMINAL HYSTERECTOMY  74   partial  . BREAST LUMPECTOMY WITH NEEDLE LOCALIZATION AND AXILLARY SENTINEL LYMPH NODE BX Right 02/01/2013   Procedure: BREAST LUMPECTOMY WITH NEEDLE LOCALIZATION AND AXILLARY SENTINEL LYMPH NODE BX;  Surgeon: Odis Hollingshead, MD;  Location: [facility];  Service: General;  Laterality: Right;  . BREAST SURGERY Right 1965   biopsy  . CERVICAL FUSION  2010  . CHOLECYSTECTOMY  2002  . COLONOSCOPY    . DILATION AND CURETTAGE OF UTERUS    . LEFT HEART CATH AND CORONARY ANGIOGRAPHY N/A 09/24/2017   Procedure: LEFT HEART CATH AND CORONARY ANGIOGRAPHY;  Surgeon: Leonie Man, MD;   Location: [facility];  Service: Cardiovascular;  Laterality: N/A;  . ULTRASOUND GUIDANCE FOR VASCULAR ACCESS Right 09/24/2017   Procedure: Ultrasound Guidance For Vascular Access;  Surgeon: Leonie Man, MD;  Location: [facility];  Service: Cardiovascular;  Laterality: Right;    FAMILY HISTORY Family History  Problem Relation Age of Onset  . Breast cancer Mother     SOCIAL HISTORY Social History   Tobacco Use  . Smoking status: Former Smoker    Packs/day: 0.50    Types: Cigarettes    Quit date: 10/07/2017    Years since quitting: 2.5  . Smokeless tobacco: Never Used  Vaping Use  . Vaping Use: Never used  Substance Use Topics  . Alcohol use: Yes    Comment: rare  . Drug use: No         OPHTHALMIC EXAM:  Base Eye Exam    Visual Acuity (ETDRS)      Right Left   Dist [place] 20/50 20/50   Dist ph  20/40 -1 20/40       Tonometry (Tonopen, 10:01 AM)      Right Left   Pressure 12 10       Pupils      Pupils Dark Light Shape React APD   Right PERRL 4 3 Round Slow None   Left PERRL 4 3 Round Slow None       Visual Fields (Counting fingers)      Left Right    Full Full       Extraocular Movement      Right Left    Full Full       Neuro/Psych    Oriented x3: Yes   Mood/Affect: Normal       Dilation    Right eye: 1.0% Mydriacyl @ 10:04 AM        Slit Lamp and Fundus Exam    External Exam      Right Left   External Normal Normal       Slit Lamp Exam      Right Left   Lids/Lashes Internal hordeolum - lower lid Normal   Conjunctiva/Sclera White and quiet White and quiet   Cornea Clear Clear   Anterior Chamber Deep and quiet Deep and quiet   Iris Round and reactive Round and reactive   Lens Posterior chamber intraocular lens Posterior chamber intraocular lens   Anterior Vitreous Normal Normal       Fundus Exam      Right Left   Posterior Vitreous Posterior vitreous detachment    Disc Normal    C/D Ratio 0.3    Macula  Subretinal neovascular membrane improved, Retinal pigment epithelial mottling,  stable Macular thickening     Vessels Normal    Periphery Normal           IMAGING AND PROCEDURES  Imaging and Procedures for 05/11/20  OCT, Retina - OU - Both Eyes       Right Eye Quality was good. Scan locations included subfoveal. Central Foveal Thickness: 235. Progression has been stable. Findings include abnormal foveal contour, retinal drusen , subretinal fluid, choroidal neovascular membrane, pigment epithelial detachment.   Left Eye Quality was good. Scan locations included subfoveal. Central Foveal Thickness: 235. Progression has been stable. Findings include retinal drusen .   Notes OD, chronic subretinal fluid well controlled inferior to the foveal region, Subfoveal region with a pigment epithelial detachment, controlled disease proces at 6-week follow-up currently.    Will delay delivery of medication today due to ocular surface eyelid infection                ASSESSMENT/PLAN:  Exudative age-related macular degeneration of right eye with active choroidal neovascularization (HCC) Will need to delay injection today because of active infection, internal hordeolum OD  Hordeolum internum right lower eyelid OD, with active infection of the eyelid, will need to treat this and delay delivery of medication today      ICD-10-CM   1. Exudative age-related macular degeneration of right eye with active choroidal neovascularization (HCC)  H35.3211 OCT, Retina - OU - Both Eyes  2. Hordeolum internum right lower eyelid  H00.022     1.  Return visit in 9 days to monitor the internal hordeolum right eye (stye).  2.  Will begin treatment with topical ophthalmic ointment only to the right eye 3 times daily to clear this issue 3.  Next visit dilate OD, possible injection intravitreal Avastin OD  Ophthalmic Meds Ordered this visit:  No orders of the defined types were placed in this  encounter.      Return in about 9 days (around 05/20/2020) for dilate, OD, AVASTIN OCT.  There are no Patient Instructions on file for this visit.   Explained the diagnoses, plan, and follow up with the patient and they expressed understanding.  Patient expressed understanding of the importance of proper follow up care.   Clent Demark Murline Weigel M.D. Diseases & Surgery of the Retina and Vitreous [facility] 05/11/20     Abbreviations: M myopia (nearsighted); A astigmatism; H hyperopia (farsighted); P presbyopia; Mrx spectacle prescription;  CTL contact lenses; OD right eye; OS left eye; OU both eyes  XT exotropia; ET esotropia; PEK punctate epithelial keratitis; PEE punctate epithelial erosions; DES dry eye syndrome; MGD meibomian gland dysfunction; ATs artificial tears; PFAT's preservative free artificial tears; [place] nuclear sclerotic cataract; PSC posterior subcapsular cataract; ERM epi-retinal membrane; PVD posterior vitreous detachment; RD retinal detachment; DM diabetes mellitus; DR diabetic retinopathy; NPDR non-proliferative diabetic retinopathy; PDR proliferative diabetic retinopathy; CSME clinically significant macular edema; DME diabetic macular edema; dbh dot blot hemorrhages; CWS cotton wool spot; POAG primary open angle glaucoma; C/D cup-to-disc ratio; HVF humphrey visual field; GVF goldmann visual field; OCT optical coherence tomography; IOP intraocular pressure; BRVO Branch retinal vein occlusion; CRVO central retinal vein occlusion; CRAO central retinal artery occlusion; BRAO branch retinal artery occlusion; RT retinal tear; SB scleral buckle; PPV pars plana vitrectomy; VH Vitreous hemorrhage; PRP panretinal laser photocoagulation; IVK intravitreal kenalog; VMT vitreomacular traction; MH Macular hole;  NVD neovascularization of the disc; NVE neovascularization elsewhere; AREDS age related eye disease  study; ARMD age related macular degeneration; POAG primary open angle  glaucoma; EBMD epithelial/anterior basement membrane dystrophy; ACIOL anterior chamber intraocular lens; IOL intraocular lens; PCIOL posterior chamber intraocular lens; Phaco/IOL phacoemulsification with intraocular lens placement; [place] photorefractive keratectomy; LASIK laser assisted in situ keratomileusis; HTN hypertension; DM diabetes mellitus; COPD chronic obstructive pulmonary disease

## 2020-05-11 NOTE — Assessment & Plan Note (Signed)
Will need to delay injection today because of active infection, internal hordeolum OD

## 2020-05-15 ENCOUNTER — Encounter (INDEPENDENT_AMBULATORY_CARE_PROVIDER_SITE_OTHER): Payer: Medicare Other | Admitting: Ophthalmology

## 2020-05-25 ENCOUNTER — Ambulatory Visit (INDEPENDENT_AMBULATORY_CARE_PROVIDER_SITE_OTHER): Payer: Medicare Other | Admitting: Ophthalmology

## 2020-05-25 ENCOUNTER — Other Ambulatory Visit: Payer: Self-pay

## 2020-05-25 ENCOUNTER — Encounter (INDEPENDENT_AMBULATORY_CARE_PROVIDER_SITE_OTHER): Payer: Self-pay | Admitting: Ophthalmology

## 2020-05-25 ENCOUNTER — Encounter (INDEPENDENT_AMBULATORY_CARE_PROVIDER_SITE_OTHER): Payer: Medicare Other | Admitting: Ophthalmology

## 2020-05-25 DIAGNOSIS — H353211 Exudative age-related macular degeneration, right eye, with active choroidal neovascularization: Secondary | ICD-10-CM | POA: Diagnosis not present

## 2020-05-25 DIAGNOSIS — H00022 Hordeolum internum right lower eyelid: Secondary | ICD-10-CM

## 2020-05-25 MED ORDER — BEVACIZUMAB CHEMO INJECTION 1.25MG/0.05ML SYRINGE FOR KALEIDOSCOPE
1.2500 mg | INTRAVITREAL | Status: AC | PRN
  Administered 2020-05-25: 1.25 mg via INTRAVITREAL

## 2020-05-25 NOTE — Patient Instructions (Signed)
Patient instructed to contact the office promptly for new onset visual acuity distortions or decline

## 2020-05-25 NOTE — Progress Notes (Signed)
05/25/2020     CHIEF COMPLAINT Patient presents for Retina Follow Up   HISTORY OF PRESENT ILLNESS: Michelle Gonzales is a 79 y.o. female who presents to the clinic today for:   HPI    Retina Follow Up    Patient presents with  Wet AMD.  In right eye.  This started 2 weeks ago.  Severity is mild.  Duration of 2 weeks.  Since onset it is stable.          Comments    2 Week AMD F/U OD, poss Avastin OD, last visit, 05/11/2020, patient had internal or delirium active OD preventing delivery of medication  Pt denies noticeable changes to [place] OU since last visit. Pt denies ocular pain, flashes of light, or floaters OU.         Last edited by Hurman Horn, MD on 05/25/2020  3:12 PM. (History)      Referring physician: [name], MD [place],  [place] [address]  HISTORICAL INFORMATION:   Selected notes from the MEDICAL RECORD NUMBER    Lab Results  Component Value Date   HGBA1C 5.7 (H) 09/21/2017     CURRENT MEDICATIONS: Current Outpatient Medications (Ophthalmic Drugs)  Medication Sig  . bacitracin-polymyxin b (POLYSPORIN) ophthalmic ointment Place into both eyes 3 (three) times daily for 14 days. Place a 1/2 inch ribbon of ointment into the lower eyelid.  . Carboxymethylcellul-Glycerin (LUBRICATING EYE DROPS OP) Place 1 drop into both eyes daily as needed (Dry Eyes).   No current facility-administered medications for this visit. (Ophthalmic Drugs)   Current Outpatient Medications (Other)  Medication Sig  . apixaban (ELIQUIS) 5 MG TABS tablet Take 1 tablet (5 mg total) by mouth 2 (two) times daily.  . beta carotene w/minerals (OCUVITE) tablet Take 1 tablet by mouth daily.  . cholecalciferol (VITAMIN D) 1000 units tablet Take 1,000 Units by mouth daily.   . metoprolol tartrate (LOPRESSOR) 25 MG tablet Take 1 tablet (25 mg total) by mouth 2 (two) times daily.  . Misc Natural Products (OSTEO BI-FLEX TRIPLE STRENGTH PO) Take 1 tablet by mouth  daily.  . montelukast (SINGULAIR) 10 MG tablet Take 10 mg by mouth at bedtime.   . sertraline (ZOLOFT) 50 MG tablet Take 50 mg by mouth daily.  . traZODone (DESYREL) 50 MG tablet Take 0.5 tablets by mouth at bedtime as needed.  . vitamin C (ASCORBIC ACID) 500 MG tablet Take 500 mg by mouth daily.    No current facility-administered medications for this visit. (Other)      REVIEW OF SYSTEMS:    ALLERGIES Allergies  Allergen Reactions  . Demerol [Meperidine] Nausea And Vomiting and Other (See Comments)    Bad headache ,  Nausea/Vomiting.  Marland Kitchen Penicillins     Has patient had a PCN reaction causing immediate rash, facial/tongue/throat swelling, SOB or lightheadedness with hypotension: YES Has patient had a PCN reaction causing severe rash involving mucus membranes or skin necrosis: NO Has patient had a PCN reaction that required hospitalization: NO Has patient had a PCN reaction occurring within the last 10 years: NO If all of the above answers are "NO", then may proceed with Cephalosporin use.   . Codeine Other (See Comments)    Massive headache  . Sulfa Antibiotics Itching and Rash    PAST MEDICAL HISTORY Past Medical History:  Diagnosis Date  . Allergy    Pcn, Sulfa, Codeine  . Anxiety   . Arthritis   .  Atrial fibrillation ([place]) 09/22/2017  . Cataract    surgey b/l  . COPD (chronic obstructive pulmonary disease) ([place])   . Cough   . Depression    hx years ago  . Hot flashes   . Shortness of breath   . Sjogren's disease [facility])    Past Surgical History:  Procedure Laterality Date  . ABDOMINAL HYSTERECTOMY  74   partial  . BREAST LUMPECTOMY WITH NEEDLE LOCALIZATION AND AXILLARY SENTINEL LYMPH NODE BX Right 02/01/2013   Procedure: BREAST LUMPECTOMY WITH NEEDLE LOCALIZATION AND AXILLARY SENTINEL LYMPH NODE BX;  Surgeon: Odis Hollingshead, MD;  Location: [facility];  Service: General;  Laterality: Right;  . BREAST SURGERY Right 1965   biopsy  . CERVICAL  FUSION  2010  . CHOLECYSTECTOMY  2002  . COLONOSCOPY    . DILATION AND CURETTAGE OF UTERUS    . LEFT HEART CATH AND CORONARY ANGIOGRAPHY N/A 09/24/2017   Procedure: LEFT HEART CATH AND CORONARY ANGIOGRAPHY;  Surgeon: Leonie Man, MD;  Location: [facility];  Service: Cardiovascular;  Laterality: N/A;  . ULTRASOUND GUIDANCE FOR VASCULAR ACCESS Right 09/24/2017   Procedure: Ultrasound Guidance For Vascular Access;  Surgeon: Leonie Man, MD;  Location: [facility];  Service: Cardiovascular;  Laterality: Right;    FAMILY HISTORY Family History  Problem Relation Age of Onset  . Breast cancer Mother     SOCIAL HISTORY Social History   Tobacco Use  . Smoking status: Former Smoker    Packs/day: 0.50    Types: Cigarettes    Quit date: 10/07/2017    Years since quitting: 2.6  . Smokeless tobacco: Never Used  Vaping Use  . Vaping Use: Never used  Substance Use Topics  . Alcohol use: Yes    Comment: rare  . Drug use: No         OPHTHALMIC EXAM:  Base Eye Exam    Visual Acuity (ETDRS)      Right Left   Dist [place] 20/40 20/40   Dist ph [place] NI 20/30 -2       Tonometry (Tonopen, 2:48 PM)      Right Left   Pressure 14 11       Pupils      Pupils Dark Light Shape React APD   Right PERRL 4 3 Round Slow None   Left PERRL 4 3 Round Slow None       Visual Fields (Counting fingers)      Left Right    Full Full       Extraocular Movement      Right Left    Full Full       Neuro/Psych    Oriented x3: Yes   Mood/Affect: Normal       Dilation    Right eye: 1.0% Mydriacyl, 2.5% Phenylephrine @ 2:52 PM        Slit Lamp and Fundus Exam    External Exam      Right Left   External Normal Normal       Slit Lamp Exam      Right Left   Lids/Lashes Internal hordeolum - lower lid, resolved, inspissated oral glands, meibomitis, rosacea blepharitis stable Normal   Conjunctiva/Sclera White and quiet White and quiet   Cornea Clear Clear   Anterior Chamber  Deep and quiet Deep and quiet   Iris Round and reactive Round and reactive   Lens Posterior chamber intraocular lens Posterior chamber intraocular lens  Anterior Vitreous Normal Normal       Fundus Exam      Right Left   Posterior Vitreous Posterior vitreous detachment, Central vitreous floaters    Disc Normal    C/D Ratio 0.3    Macula Subretinal neovascular membrane improved, Retinal pigment epithelial mottling,  stable Macular thickening, RPE detachment    Vessels Normal    Periphery Normal           IMAGING AND PROCEDURES  Imaging and Procedures for 05/25/20  OCT, Retina - OU - Both Eyes       Right Eye Quality was good. Scan locations included subfoveal. Central Foveal Thickness: 257. Progression has worsened. Findings include subretinal fluid, pigment epithelial detachment, abnormal foveal contour.   Left Eye Quality was good. Scan locations included subfoveal. Central Foveal Thickness: 244. Findings include retinal drusen , no SRF, no IRF.   Notes Increased subretinal fluid inferior to the foveal region overlying pigment epithelial detachment at longer interval follow-up, 8 weeks, will repeat injection OD today intravitreal Avastin and examination in 6 weeks         Intravitreal Injection, Pharmacologic Agent - OD - Right Eye       Time Out 05/25/2020. 3:16 PM. Confirmed correct patient, procedure, site, and patient consented.   Anesthesia Topical anesthesia was used. Anesthetic medications included [place] 3.5%.   Procedure Preparation included 10% betadine to eyelids, 5% betadine to ocular surface, Tobramycin 0.3%. A 30 gauge needle was used.   Injection:  1.25 mg Bevacizumab (AVASTIN) SOLN   NDC: 70360-001-02, Lot: [id number]   Route: Intravitreal, Site: Right Eye, Waste: 0 mg  Post-op Post injection exam found visual acuity of at least counting fingers. The patient tolerated the procedure well. There were no complications. The patient received written  and verbal post procedure care education. Post injection medications were not given.                 ASSESSMENT/PLAN:  Exudative age-related macular degeneration of right eye with active choroidal neovascularization (HCC) At 8-week interval today due to appointment needing to be delayed due to active periocular infection OD some 2 weeks previous.  Repeat intravitreal Avastin OD today, and decrease interval to more normal 6-week interval.  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  Hordeolum internum right lower eyelid Condition resolved on topical antibiotic therapy.  I will suggest the patient use the remainder of the ointment once nightly until complete      ICD-10-CM   1. Exudative age-related macular degeneration of right eye with active choroidal neovascularization (HCC)  H35.3211 OCT, Retina - OU - Both Eyes    Intravitreal Injection, Pharmacologic Agent - OD - Right Eye    Bevacizumab (AVASTIN) SOLN 1.25 mg  2. Hordeolum internum right lower eyelid  H00.022     1.  Repeat intravitreal Avastin OD today  2.  3.  Ophthalmic Meds Ordered this visit:  Meds ordered this encounter  Medications  . Bevacizumab (AVASTIN) SOLN 1.25 mg       Return in about 6 weeks (around 07/06/2020) for dilate, OD, AVASTIN OCT.  There are no Patient Instructions on file for this visit.   Explained the diagnoses, plan, and follow up with the patient and they expressed understanding.  Patient expressed understanding of the importance of proper follow up care.   Clent Demark Taylyn Brame M.D. Diseases & Surgery of the Retina and Vitreous [facility] 05/25/20     Abbreviations: M myopia (nearsighted); A astigmatism; H hyperopia (farsighted); P presbyopia; Mrx spectacle prescription;  CTL contact lenses; OD right eye; OS left eye; OU both eyes  XT exotropia; ET esotropia; PEK punctate  epithelial keratitis; PEE punctate epithelial erosions; DES dry eye syndrome; MGD meibomian gland dysfunction; ATs artificial tears; PFAT's preservative free artificial tears; [place] nuclear sclerotic cataract; PSC posterior subcapsular cataract; ERM epi-retinal membrane; PVD posterior vitreous detachment; RD retinal detachment; DM diabetes mellitus; DR diabetic retinopathy; NPDR non-proliferative diabetic retinopathy; PDR proliferative diabetic retinopathy; CSME clinically significant macular edema; DME diabetic macular edema; dbh dot blot hemorrhages; CWS cotton wool spot; POAG primary open angle glaucoma; C/D cup-to-disc ratio; HVF humphrey visual field; GVF goldmann visual field; OCT optical coherence tomography; IOP intraocular pressure; BRVO Branch retinal vein occlusion; CRVO central retinal vein occlusion; CRAO central retinal artery occlusion; BRAO branch retinal artery occlusion; RT retinal tear; SB scleral buckle; PPV pars plana vitrectomy; VH Vitreous hemorrhage; PRP panretinal laser photocoagulation; IVK intravitreal kenalog; VMT vitreomacular traction; MH Macular hole;  NVD neovascularization of the disc; NVE neovascularization elsewhere; AREDS age related eye disease study; ARMD age related macular degeneration; POAG primary open angle glaucoma; EBMD epithelial/anterior basement membrane dystrophy; ACIOL anterior chamber intraocular lens; IOL intraocular lens; PCIOL posterior chamber intraocular lens; Phaco/IOL phacoemulsification with intraocular lens placement; [place] photorefractive keratectomy; LASIK laser assisted in situ keratomileusis; HTN hypertension; DM diabetes mellitus; COPD chronic obstructive pulmonary disease

## 2020-05-25 NOTE — Assessment & Plan Note (Signed)
Condition resolved on topical antibiotic therapy.  I will suggest the patient use the remainder of the ointment once nightly until complete

## 2020-05-25 NOTE — Assessment & Plan Note (Signed)
At 8-week interval today due to appointment needing to be delayed due to active periocular infection OD some 2 weeks previous.  Repeat intravitreal Avastin OD today, and decrease interval to more normal 6-week interval.  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

## 2020-06-09 ENCOUNTER — Other Ambulatory Visit: Payer: Self-pay | Admitting: Cardiovascular Disease

## 2020-07-06 ENCOUNTER — Other Ambulatory Visit: Payer: Self-pay

## 2020-07-06 ENCOUNTER — Encounter (INDEPENDENT_AMBULATORY_CARE_PROVIDER_SITE_OTHER): Payer: Self-pay | Admitting: Ophthalmology

## 2020-07-06 ENCOUNTER — Ambulatory Visit (INDEPENDENT_AMBULATORY_CARE_PROVIDER_SITE_OTHER): Payer: Medicare Other | Admitting: Ophthalmology

## 2020-07-06 DIAGNOSIS — H353122 Nonexudative age-related macular degeneration, left eye, intermediate dry stage: Secondary | ICD-10-CM | POA: Diagnosis not present

## 2020-07-06 DIAGNOSIS — H353211 Exudative age-related macular degeneration, right eye, with active choroidal neovascularization: Secondary | ICD-10-CM

## 2020-07-06 MED ORDER — BEVACIZUMAB 2.5 MG/0.1ML IZ SOSY
2.5000 mg | PREFILLED_SYRINGE | INTRAVITREAL | Status: AC | PRN
  Administered 2020-07-06: 2.5 mg via INTRAVITREAL

## 2020-07-06 NOTE — Progress Notes (Signed)
07/06/2020     CHIEF COMPLAINT Patient presents for Retina Follow Up (6 WK FU OD, POSS AVASTIN OD////Pt reports decline in OS vision, OD good, no new f/f, no pain or pressure. Pt states stye still in OD, no improvement. (topical antibiotic not effective per patient). )   HISTORY OF PRESENT ILLNESS: Michelle Gonzales is a 79 y.o. female who presents to the clinic today for:   HPI    Retina Follow Up    In right eye.  This started 6 weeks ago.  Severity is mild.  Duration of 6 weeks. Additional comments: 6 WK FU OD, POSS AVASTIN OD    Pt reports decline in OS vision, OD good, no new f/f, no pain or pressure. Pt states stye still in OD, no improvement. (topical antibiotic not effective per patient).        Last edited by Nichola Sizer D on 07/06/2020  1:33 PM. (History)      Referring physician: [name], MD [address] [place],  [place] [address]  HISTORICAL INFORMATION:   Selected notes from the MEDICAL RECORD NUMBER    Lab Results  Component Value Date   HGBA1C 5.7 (H) 09/21/2017     CURRENT MEDICATIONS: Current Outpatient Medications (Ophthalmic Drugs)  Medication Sig  . Carboxymethylcellul-Glycerin (LUBRICATING EYE DROPS OP) Place 1 drop into both eyes daily as needed (Dry Eyes).   No current facility-administered medications for this visit. (Ophthalmic Drugs)   Current Outpatient Medications (Other)  Medication Sig  . apixaban (ELIQUIS) 5 MG TABS tablet Take 1 tablet (5 mg total) by mouth 2 (two) times daily.  . beta carotene w/minerals (OCUVITE) tablet Take 1 tablet by mouth daily.  . cholecalciferol (VITAMIN D) 1000 units tablet Take 1,000 Units by mouth daily.   . metoprolol tartrate (LOPRESSOR) 25 MG tablet Take 1 tablet (25 mg total) by mouth 2 (two) times daily.  . Misc Natural Products (OSTEO BI-FLEX TRIPLE STRENGTH PO) Take 1 tablet by mouth daily.  . montelukast (SINGULAIR) 10 MG tablet Take 10 mg by mouth at bedtime.   .  sertraline (ZOLOFT) 50 MG tablet Take 50 mg by mouth daily.  . traZODone (DESYREL) 50 MG tablet Take 0.5 tablets by mouth at bedtime as needed.  . vitamin C (ASCORBIC ACID) 500 MG tablet Take 500 mg by mouth daily.    No current facility-administered medications for this visit. (Other)      REVIEW OF SYSTEMS:    ALLERGIES Allergies  Allergen Reactions  . Demerol [Meperidine] Nausea And Vomiting and Other (See Comments)    Bad headache ,  Nausea/Vomiting.  Marland Kitchen Penicillins     Has patient had a PCN reaction causing immediate rash, facial/tongue/throat swelling, SOB or lightheadedness with hypotension: YES Has patient had a PCN reaction causing severe rash involving mucus membranes or skin necrosis: NO Has patient had a PCN reaction that required hospitalization: NO Has patient had a PCN reaction occurring within the last 10 years: NO If all of the above answers are "NO", then may proceed with Cephalosporin use.   . Codeine Other (See Comments)    Massive headache  . Sulfa Antibiotics Itching and Rash    PAST MEDICAL HISTORY Past Medical History:  Diagnosis Date  . Allergy    Pcn, Sulfa, Codeine  . Anxiety   . Arthritis   . Atrial fibrillation ([place]) 09/22/2017  . Cataract    surgey b/l  . COPD (chronic obstructive pulmonary disease) ([place])   .  Cough   . Depression    hx years ago  . Hot flashes   . Shortness of breath   . Sjogren's disease [facility])    Past Surgical History:  Procedure Laterality Date  . ABDOMINAL HYSTERECTOMY  74   partial  . BREAST LUMPECTOMY WITH NEEDLE LOCALIZATION AND AXILLARY SENTINEL LYMPH NODE BX Right 02/01/2013   Procedure: BREAST LUMPECTOMY WITH NEEDLE LOCALIZATION AND AXILLARY SENTINEL LYMPH NODE BX;  Surgeon: Odis Hollingshead, MD;  Location: [facility];  Service: General;  Laterality: Right;  . BREAST SURGERY Right 1965   biopsy  . CERVICAL FUSION  2010  . CHOLECYSTECTOMY  2002  . COLONOSCOPY    . DILATION AND CURETTAGE OF  UTERUS    . LEFT HEART CATH AND CORONARY ANGIOGRAPHY N/A 09/24/2017   Procedure: LEFT HEART CATH AND CORONARY ANGIOGRAPHY;  Surgeon: Leonie Man, MD;  Location: [facility];  Service: Cardiovascular;  Laterality: N/A;  . ULTRASOUND GUIDANCE FOR VASCULAR ACCESS Right 09/24/2017   Procedure: Ultrasound Guidance For Vascular Access;  Surgeon: Leonie Man, MD;  Location: [facility];  Service: Cardiovascular;  Laterality: Right;    FAMILY HISTORY Family History  Problem Relation Age of Onset  . Breast cancer Mother     SOCIAL HISTORY Social History   Tobacco Use  . Smoking status: Former Smoker    Packs/day: 0.50    Types: Cigarettes    Quit date: 10/07/2017    Years since quitting: 2.7  . Smokeless tobacco: Never Used  Vaping Use  . Vaping Use: Never used  Substance Use Topics  . Alcohol use: Yes    Comment: rare  . Drug use: No         OPHTHALMIC EXAM: Base Eye Exam    Visual Acuity (ETDRS)      Right Left   Dist [place] 20/60 20/50 -2   Dist ph [place] NI 20/30       Tonometry (Tonopen, 1:39 PM)      Right Left   Pressure 13 12       Pupils      Pupils Dark Light Shape React APD   Right PERRL 4 3 Round Brisk None   Left PERRL 4 3 Round Brisk None       Visual Fields (Counting fingers)      Left Right    Full Full       Extraocular Movement      Right Left    Full Full       Neuro/Psych    Oriented x3: Yes   Mood/Affect: Normal       Dilation    Both eyes: 1.0% Mydriacyl, 2.5% Phenylephrine @ 1:39 PM        Slit Lamp and Fundus Exam    External Exam      Right Left   External Normal Normal       Slit Lamp Exam      Right Left   Lids/Lashes Internal hordeolum - lower lid, resolved, inspissated oral glands, meibomitis, rosacea blepharitis stable Normal   Conjunctiva/Sclera White and quiet White and quiet   Cornea Clear Clear   Anterior Chamber Deep and quiet Deep and quiet   Iris Round and reactive Round and reactive   Lens  Posterior chamber intraocular lens Posterior chamber intraocular lens   Anterior Vitreous Normal Normal       Fundus Exam      Right Left   Posterior  Vitreous Posterior vitreous detachment, Central vitreous floaters Posterior vitreous detachment   Disc Normal Normal   C/D Ratio 0.5 0.4   Macula Subretinal neovascular membrane improved, Retinal pigment epithelial mottling,  stable Macular thickening, RPE detachment Retinal pigment epithelial atrophy, Hard drusen   Vessels Normal Normal   Periphery Normal Normal          IMAGING AND PROCEDURES  Imaging and Procedures for 07/06/20  OCT, Retina - OU - Both Eyes       Right Eye Quality was good. Scan locations included subfoveal. Central Foveal Thickness: 260. Progression has worsened. Findings include subretinal fluid, pigment epithelial detachment, abnormal foveal contour.   Left Eye Quality was good. Scan locations included subfoveal. Central Foveal Thickness: 243. Findings include retinal drusen , no SRF, no IRF.   Notes Increased subretinal fluid inferior to the foveal region overlying pigment epithelial detachment at longer interval follow-up, 8 weeks, will repeat injection OD today intravitreal Avastin and examination in 6 weeks         Intravitreal Injection, Pharmacologic Agent - OD - Right Eye       Time Out 07/06/2020. 2:28 PM. Confirmed correct patient, procedure, site, and patient consented.   Anesthesia Topical anesthesia was used. Anesthetic medications included [place] 3.5%.   Procedure Preparation included 10% betadine to eyelids, 5% betadine to ocular surface, Tobramycin 0.3%. A 30 gauge needle was used.   Injection:  2.5 mg Bevacizumab (AVASTIN) 2.5mg /0.58mL SOSY   NDC: 41660-630-16, Lot: [id number]   Route: Intravitreal, Site: Right Eye  Post-op Post injection exam found visual acuity of at least counting fingers. The patient tolerated the procedure well. There were no complications. The patient  received written and verbal post procedure care education. Post injection medications were not given.                 ASSESSMENT/PLAN:  Exudative age-related macular degeneration of right eye with active choroidal neovascularization (HCC) The nature of wet macular degeneration was discussed with the patient.  Forms of therapy reviewed include the use of Anti-VEGF medications injected painlessly into the eye, as well as other possible treatment modalities, including thermal laser therapy. Fellow eye involvement and risks were discussed with the patient. Upon the finding of wet age related macular degeneration, treatment will be offered. The treatment regimen is on a treat as needed basis with the intent to treat if necessary and extend interval of exams when possible. On average 1 out of 6 patients do not need lifetime therapy. However, the risk of recurrent disease is high for a lifetime.  Initially monthly, then periodic, examinations and evaluations will determine whether the next treatment is required on the day of the examination.  OD, subfoveal region free of subretinal fluid, more subretinal fluid inferior to the fovea yet stable over time.  6 weeks.  Repeat injection Avastin today to maintain  Intermediate stage nonexudative age-related macular degeneration of left eye No signs of CNVM OS continue to observe      ICD-10-CM   1. Exudative age-related macular degeneration of right eye with active choroidal neovascularization (HCC)  H35.3211 OCT, Retina - OU - Both Eyes    Intravitreal Injection, Pharmacologic Agent - OD - Right Eye    bevacizumab (AVASTIN) SOSY 2.5 mg  2. Intermediate stage nonexudative age-related macular degeneration of left eye  H35.3122     1.  2.  3.  Ophthalmic Meds Ordered this visit:  Meds ordered this encounter  Medications  . bevacizumab (AVASTIN) SOSY 2.5  mg       Return in about 6 weeks (around 08/17/2020) for dilate, OD, AVASTIN  OCT.  There are no Patient Instructions on file for this visit.   Explained the diagnoses, plan, and follow up with the patient and they expressed understanding.  Patient expressed understanding of the importance of proper follow up care.   Clent Demark Honest Safranek M.D. Diseases & Surgery of the Retina and Vitreous [facility] 07/06/20     Abbreviations: M myopia (nearsighted); A astigmatism; H hyperopia (farsighted); P presbyopia; Mrx spectacle prescription;  CTL contact lenses; OD right eye; OS left eye; OU both eyes  XT exotropia; ET esotropia; PEK punctate epithelial keratitis; PEE punctate epithelial erosions; DES dry eye syndrome; MGD meibomian gland dysfunction; ATs artificial tears; PFAT's preservative free artificial tears; [place] nuclear sclerotic cataract; PSC posterior subcapsular cataract; ERM epi-retinal membrane; PVD posterior vitreous detachment; RD retinal detachment; DM diabetes mellitus; DR diabetic retinopathy; NPDR non-proliferative diabetic retinopathy; PDR proliferative diabetic retinopathy; CSME clinically significant macular edema; DME diabetic macular edema; dbh dot blot hemorrhages; CWS cotton wool spot; POAG primary open angle glaucoma; C/D cup-to-disc ratio; HVF humphrey visual field; GVF goldmann visual field; OCT optical coherence tomography; IOP intraocular pressure; BRVO Branch retinal vein occlusion; CRVO central retinal vein occlusion; CRAO central retinal artery occlusion; BRAO branch retinal artery occlusion; RT retinal tear; SB scleral buckle; PPV pars plana vitrectomy; VH Vitreous hemorrhage; PRP panretinal laser photocoagulation; IVK intravitreal kenalog; VMT vitreomacular traction; MH Macular hole;  NVD neovascularization of the disc; NVE neovascularization elsewhere; AREDS age related eye disease study; ARMD age related macular degeneration; POAG primary open angle glaucoma; EBMD epithelial/anterior basement membrane dystrophy; ACIOL anterior chamber  intraocular lens; IOL intraocular lens; PCIOL posterior chamber intraocular lens; Phaco/IOL phacoemulsification with intraocular lens placement; [place] photorefractive keratectomy; LASIK laser assisted in situ keratomileusis; HTN hypertension; DM diabetes mellitus; COPD chronic obstructive pulmonary disease

## 2020-07-06 NOTE — Assessment & Plan Note (Signed)
No signs of CNVM OS continue to observe

## 2020-07-06 NOTE — Assessment & Plan Note (Signed)
The nature of wet macular degeneration was discussed with the patient.  Forms of therapy reviewed include the use of Anti-VEGF medications injected painlessly into the eye, as well as other possible treatment modalities, including thermal laser therapy. Fellow eye involvement and risks were discussed with the patient. Upon the finding of wet age related macular degeneration, treatment will be offered. The treatment regimen is on a treat as needed basis with the intent to treat if necessary and extend interval of exams when possible. On average 1 out of 6 patients do not need lifetime therapy. However, the risk of recurrent disease is high for a lifetime.  Initially monthly, then periodic, examinations and evaluations will determine whether the next treatment is required on the day of the examination.  OD, subfoveal region free of subretinal fluid, more subretinal fluid inferior to the fovea yet stable over time.  6 weeks.  Repeat injection Avastin today to maintain

## 2020-08-17 ENCOUNTER — Other Ambulatory Visit: Payer: Self-pay

## 2020-08-17 ENCOUNTER — Encounter (INDEPENDENT_AMBULATORY_CARE_PROVIDER_SITE_OTHER): Payer: Self-pay | Admitting: Ophthalmology

## 2020-08-17 ENCOUNTER — Ambulatory Visit (INDEPENDENT_AMBULATORY_CARE_PROVIDER_SITE_OTHER): Payer: Medicare Other | Admitting: Ophthalmology

## 2020-08-17 DIAGNOSIS — H353211 Exudative age-related macular degeneration, right eye, with active choroidal neovascularization: Secondary | ICD-10-CM | POA: Diagnosis not present

## 2020-08-17 DIAGNOSIS — H353122 Nonexudative age-related macular degeneration, left eye, intermediate dry stage: Secondary | ICD-10-CM

## 2020-08-17 DIAGNOSIS — H35721 Serous detachment of retinal pigment epithelium, right eye: Secondary | ICD-10-CM

## 2020-08-17 MED ORDER — BEVACIZUMAB 2.5 MG/0.1ML IZ SOSY
2.5000 mg | PREFILLED_SYRINGE | INTRAVITREAL | Status: AC | PRN
  Administered 2020-08-17: 2.5 mg via INTRAVITREAL

## 2020-08-17 NOTE — Assessment & Plan Note (Signed)
The nature of wet macular degeneration was discussed with the patient.  Forms of therapy reviewed include the use of Anti-VEGF medications injected painlessly into the eye, as well as other possible treatment modalities, including thermal laser therapy. Fellow eye involvement and risks were discussed with the patient. Upon the finding of wet age related macular degeneration, treatment will be offered. The treatment regimen is on a treat as needed basis with the intent to treat if necessary and extend interval of exams when possible. On average 1 out of 6 patients do not need lifetime therapy. However, the risk of recurrent disease is high for a lifetime.  Initially monthly, then periodic, examinations and evaluations will determine whether the next treatment is required on the day of the examination.  Chronic subretinal fluid, today at 6-week follow-up examination repeat [place] Mac Avastin today and examination again in 5 weeks

## 2020-08-17 NOTE — Progress Notes (Signed)
08/17/2020     CHIEF COMPLAINT Patient presents for Retina Follow Up (6 Wk FU OD, POSS AVASTIN OD///Pt reports cloudy vision OD, OS stable. Pt denies any new F/F, pain, or pressure OU. )   HISTORY OF PRESENT ILLNESS: Michelle Gonzales is a 80 y.o. female who presents to the clinic today for:   HPI    Retina Follow Up    Patient presents with  Wet AMD.  In right eye.  This started 6 weeks ago.  Duration of 6 weeks.  Since onset it is gradually worsening. Additional comments: 6 Wk FU OD, POSS AVASTIN OD   Pt reports cloudy vision OD, OS stable. Pt denies any new F/F, pain, or pressure OU.        Last edited by Nichola Sizer D on 08/17/2020  1:53 PM. (History)      Referring physician: [name], MD [address] [place],  [place] [address]  HISTORICAL INFORMATION:   Selected notes from the MEDICAL RECORD NUMBER    Lab Results  Component Value Date   HGBA1C 5.7 (H) 09/21/2017     CURRENT MEDICATIONS: Current Outpatient Medications (Ophthalmic Drugs)  Medication Sig  . Carboxymethylcellul-Glycerin (LUBRICATING EYE DROPS OP) Place 1 drop into both eyes daily as needed (Dry Eyes).   No current facility-administered medications for this visit. (Ophthalmic Drugs)   Current Outpatient Medications (Other)  Medication Sig  . apixaban (ELIQUIS) 5 MG TABS tablet Take 1 tablet (5 mg total) by mouth 2 (two) times daily.  . beta carotene w/minerals (OCUVITE) tablet Take 1 tablet by mouth daily.  . cholecalciferol (VITAMIN D) 1000 units tablet Take 1,000 Units by mouth daily.   . metoprolol tartrate (LOPRESSOR) 25 MG tablet Take 1 tablet (25 mg total) by mouth 2 (two) times daily.  . Misc Natural Products (OSTEO BI-FLEX TRIPLE STRENGTH PO) Take 1 tablet by mouth daily.  . montelukast (SINGULAIR) 10 MG tablet Take 10 mg by mouth at bedtime.   . sertraline (ZOLOFT) 50 MG tablet Take 50 mg by mouth daily.  . traZODone (DESYREL) 50 MG tablet Take 0.5 tablets by  mouth at bedtime as needed.  . vitamin C (ASCORBIC ACID) 500 MG tablet Take 500 mg by mouth daily.    No current facility-administered medications for this visit. (Other)      REVIEW OF SYSTEMS:    ALLERGIES Allergies  Allergen Reactions  . Demerol [Meperidine] Nausea And Vomiting and Other (See Comments)    Bad headache ,  Nausea/Vomiting.  Marland Kitchen Penicillins     Has patient had a PCN reaction causing immediate rash, facial/tongue/throat swelling, SOB or lightheadedness with hypotension: YES Has patient had a PCN reaction causing severe rash involving mucus membranes or skin necrosis: NO Has patient had a PCN reaction that required hospitalization: NO Has patient had a PCN reaction occurring within the last 10 years: NO If all of the above answers are "NO", then may proceed with Cephalosporin use.   . Codeine Other (See Comments)    Massive headache  . Sulfa Antibiotics Itching and Rash    PAST MEDICAL HISTORY Past Medical History:  Diagnosis Date  . Allergy    Pcn, Sulfa, Codeine  . Anxiety   . Arthritis   . Atrial fibrillation ([place]) 09/22/2017  . Cataract    surgey b/l  . COPD (chronic obstructive pulmonary disease) ([place])   . Cough   . Depression    hx years ago  . Hordeolum internum right  lower eyelid 05/11/2020  . Hot flashes   . Shortness of breath   . Sjogren's disease [facility])    Past Surgical History:  Procedure Laterality Date  . ABDOMINAL HYSTERECTOMY  73   partial  . BREAST LUMPECTOMY WITH NEEDLE LOCALIZATION AND AXILLARY SENTINEL LYMPH NODE BX Right 02/01/2013   Procedure: BREAST LUMPECTOMY WITH NEEDLE LOCALIZATION AND AXILLARY SENTINEL LYMPH NODE BX;  Surgeon: Odis Hollingshead, MD;  Location: [place] [facility];  Service: General;  Laterality: Right;  . BREAST SURGERY Right 1965   biopsy  . CERVICAL FUSION  2010  . CHOLECYSTECTOMY  2002  . COLONOSCOPY    . DILATION AND CURETTAGE OF UTERUS    . LEFT HEART CATH AND CORONARY ANGIOGRAPHY N/A  09/24/2017   Procedure: LEFT HEART CATH AND CORONARY ANGIOGRAPHY;  Surgeon: Leonie Man, MD;  Location: [place] [facility];  Service: Cardiovascular;  Laterality: N/A;  . ULTRASOUND GUIDANCE FOR VASCULAR ACCESS Right 09/24/2017   Procedure: Ultrasound Guidance For Vascular Access;  Surgeon: Leonie Man, MD;  Location: [place] CV LAB;  Service: Cardiovascular;  Laterality: Right;    FAMILY HISTORY Family History  Problem Relation Age of Onset  . Breast cancer Mother     SOCIAL HISTORY Social History   Tobacco Use  . Smoking status: Former Smoker    Packs/day: 0.50    Types: Cigarettes    Quit date: 10/07/2017    Years since quitting: 2.8  . Smokeless tobacco: Never Used  Vaping Use  . Vaping Use: Never used  Substance Use Topics  . Alcohol use: Yes    Comment: rare  . Drug use: No         OPHTHALMIC EXAM: Base Eye Exam    Visual Acuity (ETDRS)      Right Left   Dist [place] 20/70 +2 20/60 -1   Dist ph [place] NI 20/30 -1       Tonometry (Tonopen, 1:58 PM)      Right Left   Pressure 12 12       Pupils      Pupils Dark Light Shape React APD   Right PERRL 4 3 Round Brisk None   Left PERRL 4 3 Round Brisk None       Visual Fields (Counting fingers)      Left Right    Full Full       Extraocular Movement      Right Left    Full Full       Neuro/Psych    Oriented x3: Yes   Mood/Affect: Normal       Dilation    Right eye: 1.0% Mydriacyl, 2.5% Phenylephrine @ 1:58 PM        Slit Lamp and Fundus Exam    External Exam      Right Left   External Normal Normal       Slit Lamp Exam      Right Left   Lids/Lashes Internal hordeolum - lower lid, resolved, inspissated oral glands, meibomitis, rosacea blepharitis stable Normal   Conjunctiva/Sclera White and quiet White and quiet   Cornea Clear Clear   Anterior Chamber Deep and quiet Deep and quiet   Iris Round and reactive Round and reactive   Lens Posterior chamber intraocular lens Posterior chamber  intraocular lens   Anterior Vitreous Normal Normal       Fundus Exam      Right Left   Posterior Vitreous Posterior vitreous detachment, Central vitreous  floaters    Disc Normal    C/D Ratio 0.5    Macula Subretinal neovascular membrane improved, Retinal pigment epithelial mottling,  stable Macular thickening, RPE detachment    Vessels Normal    Periphery Normal           IMAGING AND PROCEDURES  Imaging and Procedures for 08/17/20  OCT, Retina - OU - Both Eyes       Right Eye Quality was good. Scan locations included subfoveal. Central Foveal Thickness: 256. Progression has been stable. Findings include subretinal fluid, pigment epithelial detachment.   Left Eye Quality was good. Scan locations included subfoveal. Central Foveal Thickness: 241. Progression has been stable. Findings include abnormal foveal contour, no IRF, no SRF.   Notes Persistent subretinal fluid, today at 6-week follow-up OD, will repeat injection Avastin today and shorten interval examination next to 5 weeks right eye  OS no active disease       Intravitreal Injection, Pharmacologic Agent - OD - Right Eye       Time Out 08/17/2020. 2:59 PM. Confirmed correct patient, procedure, site, and patient consented.   Anesthesia Topical anesthesia was used. Anesthetic medications included Akten 3.5%.   Procedure Preparation included 10% betadine to eyelids, 5% betadine to ocular surface, Tobramycin 0.3%. A 30 gauge needle was used.   Injection:  2.5 mg Bevacizumab (AVASTIN) 2.5mg /0.32mL SOSY   NDC: [id number], [id number]:[id number]   Route: Intravitreal, Site: Right Eye  Post-op Post injection exam found visual acuity of at least counting fingers. The patient tolerated the procedure well. There were no complications. The patient received written and verbal post procedure care education. Post injection medications were not given.                 ASSESSMENT/PLAN:  Exudative age-related macular  degeneration of right eye with active choroidal neovascularization (HCC) The nature of wet macular degeneration was discussed with the patient.  Forms of therapy reviewed include the use of Anti-VEGF medications injected painlessly into the eye, as well as other possible treatment modalities, including thermal laser therapy. Fellow eye involvement and risks were discussed with the patient. Upon the finding of wet age related macular degeneration, treatment will be offered. The treatment regimen is on a treat as needed basis with the intent to treat if necessary and extend interval of exams when possible. On average 1 out of 6 patients do not need lifetime therapy. However, the risk of recurrent disease is high for a lifetime.  Initially monthly, then periodic, examinations and evaluations will determine whether the next treatment is required on the day of the examination.  Chronic subretinal fluid, today at 6-week follow-up examination repeat [place] Mac Avastin today and examination again in 5 weeks      ICD-10-CM   1. Exudative age-related macular degeneration of right eye with active choroidal neovascularization (HCC)  H35.3211 OCT, Retina - OU - Both Eyes    Intravitreal Injection, Pharmacologic Agent - OD - Right Eye    bevacizumab (AVASTIN) SOSY 2.5 mg  2. Retinal pigment epithelial detachment, right  H35.721 Intravitreal Injection, Pharmacologic Agent - OD - Right Eye    bevacizumab (AVASTIN) SOSY 2.5 mg    1. 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 May consider application to [facility] for you or other assistance should change medication to be required in the future  3.  Ophthalmic Meds Ordered this  visit:  Meds ordered this encounter  Medications  . bevacizumab (AVASTIN) SOSY 2.5 mg       Return in about 5 weeks (around 09/21/2020) for dilate, OD, AVASTIN OCT.  There are no  Patient Instructions on file for this visit.   Explained the diagnoses, plan, and follow up with the patient and they expressed understanding.  Patient expressed understanding of the importance of proper follow up care.   Clent Demark Dyland Panuco M.D. Diseases & Surgery of the Retina and Vitreous [facility]ina [facility] Diabetic [place] 08/17/20     Abbreviations: M myopia (nearsighted); A astigmatism; H hyperopia (farsighted); P presbyopia; Mrx spectacle prescription;  CTL contact lenses; OD right eye; OS left eye; OU both eyes  XT exotropia; ET esotropia; PEK punctate epithelial keratitis; PEE punctate epithelial erosions; DES dry eye syndrome; MGD meibomian gland dysfunction; ATs artificial tears; PFAT's preservative free artificial tears; [place] nuclear sclerotic cataract; PSC posterior subcapsular cataract; ERM epi-retinal membrane; PVD posterior vitreous detachment; RD retinal detachment; DM diabetes mellitus; DR diabetic retinopathy; NPDR non-proliferative diabetic retinopathy; PDR proliferative diabetic retinopathy; CSME clinically significant macular edema; DME diabetic macular edema; dbh dot blot hemorrhages; CWS cotton wool spot; POAG primary open angle glaucoma; C/D cup-to-disc ratio; HVF humphrey visual field; GVF goldmann visual field; OCT optical coherence tomography; IOP intraocular pressure; BRVO Branch retinal vein occlusion; CRVO central retinal vein occlusion; CRAO central retinal artery occlusion; BRAO branch retinal artery occlusion; RT retinal tear; SB scleral buckle; PPV pars plana vitrectomy; VH Vitreous hemorrhage; PRP panretinal laser photocoagulation; IVK intravitreal kenalog; VMT vitreomacular traction; MH Macular hole;  NVD neovascularization of the disc; NVE neovascularization elsewhere; AREDS age related eye disease study; ARMD age related macular degeneration; POAG primary open angle glaucoma; EBMD epithelial/anterior basement membrane dystrophy; ACIOL anterior chamber intraocular lens; IOL  intraocular lens; PCIOL posterior chamber intraocular lens; Phaco/IOL phacoemulsification with intraocular lens placement; [place] photorefractive keratectomy; LASIK laser assisted in situ keratomileusis; HTN hypertension; DM diabetes mellitus; COPD chronic obstructive pulmonary disease

## 2020-08-17 NOTE — Assessment & Plan Note (Signed)
No signs of active CNVM

## 2020-09-20 NOTE — Progress Notes (Signed)
Cardiology Office Note    Date:  10/03/2020   ID:  Michelle Gonzales, DOB 03-24-1941, MRN [id number]   PCP:  [name], [place]  Cardiologist:  Mertie Moores, MD  Advanced Practice Provider:  No care team member to display Electrophysiologist:  None   [id number]   Chief Complaint  Patient presents with  . Follow-up    History of Present Illness:  Michelle Gonzales is a 80 y.o. female with history of PAF, minimal CAD on cath 2019.   Patient last saw Dr. Acie Fredrickson 04/03/2020 at which time she was having a lot of mucus production and cough she thought was related to diltiazem.  He offered to start her on metoprolol 25 mg twice daily.  If this did not work she could go back on diltiazem.  Patient comes in for f/u. Still has cough but wants to stay on metoprolol. Denies chest pain, palpitations, dyspnea, dizziness or edema. Does dancing with grand daughter 3 times a week for an hour. She decreased her Eliquis to 2.5 mg bid because it makes her cold and has become expensive.  Past Medical History:  Diagnosis Date  . Allergy    Pcn, Sulfa, Codeine  . Anxiety   . Arthritis   . Atrial fibrillation ([place]) 09/22/2017  . Cataract    surgey b/l  . COPD (chronic obstructive pulmonary disease) ([place])   . Cough   . Depression    hx years ago  . Hordeolum internum right lower eyelid 05/11/2020  . Hot flashes   . Shortness of breath   . Sjogren's disease [facility])     Past Surgical History:  Procedure Laterality Date  . ABDOMINAL HYSTERECTOMY  74   partial  . BREAST LUMPECTOMY WITH NEEDLE LOCALIZATION AND AXILLARY SENTINEL LYMPH NODE BX Right 02/01/2013   Procedure: BREAST LUMPECTOMY WITH NEEDLE LOCALIZATION AND AXILLARY SENTINEL LYMPH NODE BX;  Surgeon: Odis Hollingshead, MD;  Location: [place];  Service: General;  Laterality: Right;  . BREAST SURGERY Right 1965   biopsy  . CERVICAL FUSION  2010  . CHOLECYSTECTOMY  2002  . COLONOSCOPY     . DILATION AND CURETTAGE OF UTERUS    . LEFT HEART CATH AND CORONARY ANGIOGRAPHY N/A 09/24/2017   Procedure: LEFT HEART CATH AND CORONARY ANGIOGRAPHY;  Surgeon: Leonie Man, MD;  Location: [place] [facility] LAB;  Service: Cardiovascular;  Laterality: N/A;  . ULTRASOUND GUIDANCE FOR VASCULAR ACCESS Right 09/24/2017   Procedure: Ultrasound Guidance For Vascular Access;  Surgeon: Leonie Man, MD;  Location: [place] CV LAB;  Service: Cardiovascular;  Laterality: Right;    Current Medications: Current Meds  Medication Sig  . apixaban (ELIQUIS) 5 MG TABS tablet Take 1 tablet (5 mg total) by mouth 2 (two) times daily.  . beta carotene w/minerals (OCUVITE) tablet Take 1 tablet by mouth daily.  . Carboxymethylcellul-Glycerin (LUBRICATING EYE DROPS OP) Place 1 drop into both eyes daily as needed (Dry Eyes).  . cholecalciferol (VITAMIN D) 1000 units tablet Take 1,000 Units by mouth daily.   . metoprolol tartrate (LOPRESSOR) 25 MG tablet Take 1 tablet (25 mg total) by mouth 2 (two) times daily.  . Misc Natural Products (OSTEO BI-FLEX TRIPLE STRENGTH PO) Take 1 tablet by mouth daily.  . montelukast (SINGULAIR) 10 MG tablet Take 10 mg by mouth at bedtime.   . sertraline (ZOLOFT) 50 MG tablet Take 50 mg by mouth daily.  . traZODone (DESYREL) 50  MG tablet Take 0.5 tablets by mouth at bedtime as needed.  . varenicline (CHANTIX) 0.5 MG tablet Take 1 tablet (0.5 mg total) by mouth as directed.  . vitamin C (ASCORBIC ACID) 500 MG tablet Take 500 mg by mouth daily.      Allergies:   Demerol [meperidine], Penicillins, Codeine, and Sulfa antibiotics   Social History   Socioeconomic History  . Marital status: Married    Spouse name: Not on file  . Number of children: Not on file  . Years of education: Not on file  . Highest education level: Not on file  Occupational History  . Not on file  Tobacco Use  . Smoking status: Former Smoker    Packs/day: 0.50    Types: Cigarettes    Quit date:  10/07/2017    Years since quitting: 2.9  . Smokeless tobacco: Never Used  Vaping Use  . Vaping Use: Never used  Substance and Sexual Activity  . Alcohol use: Yes    Comment: rare  . Drug use: No  . Sexual activity: Yes  Other Topics Concern  . Not on file  Social History Narrative  . Not on file   Social Determinants of Health   Financial Resource Strain: Not on file  Food Insecurity: Not on file  Transportation Needs: Not on file  Physical Activity: Not on file  Stress: Not on file  Social Connections: Not on file     Family History:  The patient's   family history includes Breast cancer in her mother.   ROS:   Please see the history of present illness.    ROS All other systems reviewed and are negative.   PHYSICAL EXAM:   VS:  BP 132/78   Pulse 67   Ht 5' (1.524 m)   Wt 139 lb 9.6 oz (63.3 kg)   SpO2 95%   BMI 27.26 kg/m   Physical Exam  GEN: Well nourished, well developed, in no acute distress  Neck: no JVD, carotid bruits, or masses Cardiac:RRR; no murmurs, rubs, or gallops  Respiratory: Decreased breath sounds with some wheezing GI: soft, nontender, nondistended, + BS Ext: without cyanosis, clubbing, or edema, Good distal pulses bilaterally Neuro:  Alert and Oriented x 3 Psych: euthymic mood, full affect  Wt Readings from Last 3 Encounters:  10/03/20 139 lb 9.6 oz (63.3 kg)  04/03/20 143 lb (64.9 kg)  12/31/19 140 lb 6.4 oz (63.7 kg)      Studies/Labs Reviewed:   EKG:  EKG is not ordered today.    Recent Labs: 04/03/2020: BUN 11; Creatinine, Ser 0.77; Hemoglobin 13.4; Platelets 124; Potassium 4.6; Sodium 136   Lipid Panel    Component Value Date/Time   CHOL 171 09/21/2017 1001   TRIG 117 09/21/2017 1001   HDL 53 09/21/2017 1001   CHOLHDL 3.2 09/21/2017 1001   VLDL 23 09/21/2017 1001   LDLCALC 95 09/21/2017 1001    Additional studies/ records that were reviewed today include:  Echo 09/22/2017 Study Conclusions   - Left ventricle: The  cavity size was normal. There was mild    concentric hypertrophy. Systolic function was normal. The    estimated ejection fraction was in the range of 55% to 60%. Wall    motion was normal; there were no regional wall motion    abnormalities.  - Aortic valve: There was mild regurgitation.  - Mitral valve: Mild focal calcification of the anterior leaflet    (medial segment(s)). There was trivial regurgitation.  -  Pericardium, extracardiac: A trivial, free-flowing pericardial    effusion was identified along the right ventricular free wall.    The fluid had no internal echoes.   Catheterization 09/24/2017  Prox LAD to Mid LAD lesion is 20% stenosed. Prox RCA lesion is 20% stenosed. -Otherwise essentially normal coronary arteries with no culprit lesion to explain abnormal stress test.  The left ventricular systolic function is normal. The left ventricular ejection fraction is 55-65% by visual estimate.  LV end diastolic pressure is normal.  There is mild (2+) mitral regurgitation.   Angiographically minimal coronary arteries.  Normal LV function.  Normal LVEDP. Suspect that chest pain is related to atrial fibrillation as opposed to ischemic CAD related angina.   Plan: Return to nursing unit for ongoing care. We will start Eliquis 5 mg twice daily starting tonight. Continue treatment of A. Fib -consider possible cardioversion either staged as an outpatient or inpatient TEE.    10/17/2017 Study Highlights   NSR  Occasional PVCs  No significant arrhythmias      Risk Assessment/Calculations:    CHA2DS2-VASc Score = 4  This indicates a 4.8% annual risk of stroke. The patient's score is based upon: CHF History: No HTN History: Yes Diabetes History: No Stroke History: No Vascular Disease History: No Age Score: 2 Gender Score: 1        ASSESSMENT:    1. Paroxysmal atrial fibrillation (HCC)   2. Coronary artery disease involving native coronary artery of native heart  without angina pectoris   3. Tobacco abuse      PLAN:  In order of problems listed above:  Paroxysmal atrial fibrillation on Eliquis and metoprolol.  Unfortunately the patient cut back her Eliquis to 2.5 mg twice daily because of feeling cold and expense.  I spoke with the pharmacist and she should not be paying more than $47 a month.  Increase to 5 mg twice daily.  Check surveillance labs today.  Nonobstructive CAD on cath in 2019 no chest pain, 150 minutes of exercise weekly  Tobacco abuse smoking 1/2 pack daily.  She cannot use the nicotine patches or gum.  Chantix helped in the past but this was recalled and she was not able to get it.  We will give her a prescription for Chantix-she never had trouble on this in the past. On Zoloft for anxiety. No suicidal thoughts.    Shared Decision Making/Informed Consent        Medication Adjustments/Labs and Tests Ordered: Current medicines are reviewed at length with the patient today.  Concerns regarding medicines are outlined above.  Medication changes, Labs and Tests ordered today are listed in the Patient Instructions below. Patient Instructions  Medication Instructions:  1) Please resume Eliquis 5 mg twice a day   2) Start Chantix 0.5 mg, Day 1-3 take 1 tablet by mouth daily, Day 4-7 take 1 tablet by mouth twice a day, Day 8 take 2 tablets by mouth.  *If you need a refill on your cardiac medications before your next appointment, please call your pharmacy*   Lab Work: Cbc, Cmp- Today   If you have labs (blood work) drawn today and your tests are completely normal, you will receive your results only by: Marland Kitchen MyChart Message (if you have MyChart) OR . A paper copy in the mail If you have any lab test that is abnormal or we need to change your treatment, we will call you to review the results.   Testing/Procedures: None ordered    Follow-Up: At  [facility], you and your health needs are our priority.  As part of our continuing  mission to provide you with exceptional heart care, we have created designated Provider Care Teams.  These Care Teams include your primary Cardiologist (physician) and Advanced Practice Providers (APPs -  Physician Assistants and Nurse Practitioners) who all work together to provide you with the care you need, when you need it.  We recommend signing up for the patient portal called "MyChart".  Sign up information is provided on this After Visit Summary.  MyChart is used to connect with patients for Virtual Visits (Telemedicine).  Patients are able to view lab/test results, encounter notes, upcoming appointments, etc.  Non-urgent messages can be sent to your provider as well.   To learn more about what you can do with MyChart, go to [email or web address].    Your next appointment:   6 month(s)  The format for your next appointment:   In Person  Provider:   You may see Mertie Moores, MD or one of the following Advanced Practice Providers on your designated Care Team:    Richardson Dopp, PA-C  Robbie Lis, [place]    Other Instructions None      Signed, Ermalinda Barrios, Hershal Coria  10/03/2020 11:31 AM    [address], [place], [place]  [address] Phone: [phone]; Fax: [phone]

## 2020-09-21 ENCOUNTER — Encounter (INDEPENDENT_AMBULATORY_CARE_PROVIDER_SITE_OTHER): Payer: Self-pay | Admitting: Ophthalmology

## 2020-09-21 ENCOUNTER — Ambulatory Visit (INDEPENDENT_AMBULATORY_CARE_PROVIDER_SITE_OTHER): Payer: Medicare Other | Admitting: Ophthalmology

## 2020-09-21 ENCOUNTER — Other Ambulatory Visit: Payer: Self-pay

## 2020-09-21 DIAGNOSIS — H353122 Nonexudative age-related macular degeneration, left eye, intermediate dry stage: Secondary | ICD-10-CM

## 2020-09-21 DIAGNOSIS — H3570 Unspecified separation of retinal layers: Secondary | ICD-10-CM

## 2020-09-21 DIAGNOSIS — H353211 Exudative age-related macular degeneration, right eye, with active choroidal neovascularization: Secondary | ICD-10-CM

## 2020-09-21 MED ORDER — BEVACIZUMAB 2.5 MG/0.1ML IZ SOSY
2.5000 mg | PREFILLED_SYRINGE | INTRAVITREAL | Status: AC | PRN
  Administered 2020-09-21: 2.5 mg via INTRAVITREAL

## 2020-09-21 NOTE — Assessment & Plan Note (Addendum)
As a component of of CNVM, stable

## 2020-09-21 NOTE — Assessment & Plan Note (Signed)
The nature of wet macular degeneration was discussed with the patient.  Forms of therapy reviewed include the use of Anti-VEGF medications injected painlessly into the eye, as well as other possible treatment modalities, including thermal laser therapy. Fellow eye involvement and risks were discussed with the patient. Upon the finding of wet age related macular degeneration, treatment will be offered. The treatment regimen is on a treat as needed basis with the intent to treat if necessary and extend interval of exams when possible. On average 1 out of 6 patients do not need lifetime therapy. However, the risk of recurrent disease is high for a lifetime.  Initially monthly, then periodic, examinations and evaluations will determine whether the next treatment is required on the day of the examination.The nature of wet macular degeneration was discussed with the patient.  Forms of therapy reviewed include the use of Anti-VEGF medications injected painlessly into the eye, as well as other possible treatment modalities, including thermal laser therapy. Fellow eye involvement and risks were discussed with the patient. Upon the finding of wet age related macular degeneration, treatment will be offered. The treatment regimen is on a treat as needed basis with the intent to treat if necessary and extend interval of exams when possible. On average 1 out of 6 patients do not need lifetime therapy. However, the risk of recurrent disease is high for a lifetime.  Initially monthly, then periodic, examinations and evaluations will determine whether the next treatment is required on the day of the examination.The nature of wet macular degeneration was discussed with the patient.  Forms of therapy reviewed include the use of Anti-VEGF medications injected painlessly into the eye, as well as other possible treatment modalities, including thermal laser therapy. Fellow eye involvement and risks were discussed with the patient. Upon  the finding of wet age related macular degeneration, treatment will be offered. The treatment regimen is on a treat as needed basis with the intent to treat if necessary and extend interval of exams when possible. On average 1 out of 6 patients do not need lifetime therapy. However, the risk of recurrent disease is high for a lifetime.  Initially monthly, then periodic, examinations and evaluations will determine whether the next treatment is required on the day of the examination.  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  We will repeat injection today and examination again in 5 weeks

## 2020-09-21 NOTE — Assessment & Plan Note (Signed)
No signs of CNVM by OCT

## 2020-09-21 NOTE — Progress Notes (Signed)
09/21/2020     CHIEF COMPLAINT Patient presents for Retina Follow Up (5 Week Wet AMD f\u OD. Possible Avastin OD. OCT/Pt states vision is about the same. Denies new complaints.)   HISTORY OF PRESENT ILLNESS: Michelle Gonzales is a 80 y.o. female who presents to the clinic today for:   HPI    Retina Follow Up    Patient presents with  Wet AMD.  In right eye.  Severity is moderate.  Duration of 5 weeks.  Since onset it is stable.  I, the attending physician,  performed the HPI with the patient and updated documentation appropriately. Additional comments: 5 Week Wet AMD f\u OD. Possible Avastin OD. OCT Pt states vision is about the same. Denies new complaints.       Last edited by Tilda Franco on 09/21/2020  1:58 PM. (History)      Referring physician: [name], MD [place],  [place] [address]  HISTORICAL INFORMATION:   Selected notes from the MEDICAL RECORD NUMBER    Lab Results  Component Value Date   HGBA1C 5.7 (H) 09/21/2017     CURRENT MEDICATIONS: Current Outpatient Medications (Ophthalmic Drugs)  Medication Sig  . Carboxymethylcellul-Glycerin (LUBRICATING EYE DROPS OP) Place 1 drop into both eyes daily as needed (Dry Eyes).   No current facility-administered medications for this visit. (Ophthalmic Drugs)   Current Outpatient Medications (Other)  Medication Sig  . apixaban (ELIQUIS) 5 MG TABS tablet Take 1 tablet (5 mg total) by mouth 2 (two) times daily.  . beta carotene w/minerals (OCUVITE) tablet Take 1 tablet by mouth daily.  . cholecalciferol (VITAMIN D) 1000 units tablet Take 1,000 Units by mouth daily.   . metoprolol tartrate (LOPRESSOR) 25 MG tablet Take 1 tablet (25 mg total) by mouth 2 (two) times daily.  . Misc Natural Products (OSTEO BI-FLEX TRIPLE STRENGTH PO) Take 1 tablet by mouth daily.  . montelukast (SINGULAIR) 10 MG tablet Take 10 mg by mouth at bedtime.   . sertraline (ZOLOFT) 50 MG tablet Take 50 mg by mouth  daily.  . traZODone (DESYREL) 50 MG tablet Take 0.5 tablets by mouth at bedtime as needed.  . vitamin C (ASCORBIC ACID) 500 MG tablet Take 500 mg by mouth daily.    No current facility-administered medications for this visit. (Other)      REVIEW OF SYSTEMS:    ALLERGIES Allergies  Allergen Reactions  . Demerol [Meperidine] Nausea And Vomiting and Other (See Comments)    Bad headache ,  Nausea/Vomiting.  Marland Kitchen Penicillins     Has patient had a PCN reaction causing immediate rash, facial/tongue/throat swelling, SOB or lightheadedness with hypotension: YES Has patient had a PCN reaction causing severe rash involving mucus membranes or skin necrosis: NO Has patient had a PCN reaction that required hospitalization: NO Has patient had a PCN reaction occurring within the last 10 years: NO If all of the above answers are "NO", then may proceed with Cephalosporin use.   . Codeine Other (See Comments)    Massive headache  . Sulfa Antibiotics Itching and Rash    PAST MEDICAL HISTORY Past Medical History:  Diagnosis Date  . Allergy    Pcn, Sulfa, Codeine  . Anxiety   . Arthritis   . Atrial fibrillation ([place]) 09/22/2017  . Cataract    surgey b/l  . COPD (chronic obstructive pulmonary disease) ([facility])   . Cough   . Depression    hx years ago  .  Hordeolum internum right lower eyelid 05/11/2020  . Hot flashes   . Shortness of breath   . Sjogren's disease [facility])    Past Surgical History:  Procedure Laterality Date  . ABDOMINAL HYSTERECTOMY  74   partial  . BREAST LUMPECTOMY WITH NEEDLE LOCALIZATION AND AXILLARY SENTINEL LYMPH NODE BX Right 02/01/2013   Procedure: BREAST LUMPECTOMY WITH NEEDLE LOCALIZATION AND AXILLARY SENTINEL LYMPH NODE BX;  Surgeon: Odis Hollingshead, MD;  Location: [facility];  Service: General;  Laterality: Right;  . BREAST SURGERY Right 1965   biopsy  . CERVICAL FUSION  2010  . CHOLECYSTECTOMY  2002  . COLONOSCOPY    . DILATION AND CURETTAGE OF  UTERUS    . LEFT HEART CATH AND CORONARY ANGIOGRAPHY N/A 09/24/2017   Procedure: LEFT HEART CATH AND CORONARY ANGIOGRAPHY;  Surgeon: Leonie Man, MD;  Location: [facility];  Service: Cardiovascular;  Laterality: N/A;  . ULTRASOUND GUIDANCE FOR VASCULAR ACCESS Right 09/24/2017   Procedure: Ultrasound Guidance For Vascular Access;  Surgeon: Leonie Man, MD;  Location: [facility];  Service: Cardiovascular;  Laterality: Right;    FAMILY HISTORY Family History  Problem Relation Age of Onset  . Breast cancer Mother     SOCIAL HISTORY Social History   Tobacco Use  . Smoking status: Former Smoker    Packs/day: 0.50    Types: Cigarettes    Quit date: 10/07/2017    Years since quitting: 2.9  . Smokeless tobacco: Never Used  Vaping Use  . Vaping Use: Never used  Substance Use Topics  . Alcohol use: Yes    Comment: rare  . Drug use: No         OPHTHALMIC EXAM: Base Eye Exam    Visual Acuity (Snellen - Linear)      Right Left   Dist [place] 20/40 -1 20/60 -2   Dist ph [place] NI 20/40       Tonometry (Tonopen, 2:02 PM)      Right Left   Pressure 9 10       Pupils      Pupils Dark Light Shape React APD   Right PERRL 4 3 Round Slow None   Left PERRL 4 3 Round Slow None       Visual Fields (Counting fingers)      Left Right    Full Full       Neuro/Psych    Oriented x3: Yes   Mood/Affect: Normal       Dilation    Right eye: 1.0% Mydriacyl, 2.5% Phenylephrine @ 2:02 PM        Slit Lamp and Fundus Exam    External Exam      Right Left   External Normal Normal       Slit Lamp Exam      Right Left   Lids/Lashes Internal hordeolum - lower lid, resolved, inspissated oral glands, meibomitis, rosacea blepharitis stable Normal   Conjunctiva/Sclera White and quiet White and quiet   Cornea Clear Clear   Anterior Chamber Deep and quiet Deep and quiet   Iris Round and reactive Round and reactive   Lens Posterior chamber intraocular lens Posterior chamber  intraocular lens   Anterior Vitreous Normal Normal       Fundus Exam      Right Left   Posterior Vitreous Posterior vitreous detachment, Central vitreous floaters    Disc Normal    C/D Ratio 0.5    Macula  Subretinal neovascular membrane improved , Retinal pigment epithelial mottling,  stable Macular thickening, RPE detachment, no retinal hemorrhage    Vessels Normal    Periphery Normal           IMAGING AND PROCEDURES  Imaging and Procedures for 09/21/20  OCT, Retina - OU - Both Eyes       Right Eye Quality was good. Scan locations included subfoveal. Central Foveal Thickness: 250. Progression has been stable. Findings include subretinal fluid, pigment epithelial detachment.   Left Eye Quality was good. Scan locations included subfoveal. Central Foveal Thickness: 238. Progression has been stable. Findings include abnormal foveal contour, no IRF, no SRF.   Notes Persistent subretinal fluid, today at 5-week follow-up OD, will repeat injection Avastin today and maintain interval examination next to 5 weeks right eye  OS no active disease       Intravitreal Injection, Pharmacologic Agent - OD - Right Eye       Time Out 09/21/2020. 2:52 PM. Confirmed correct patient, procedure, site, and patient consented.   Anesthesia Topical anesthesia was used. Anesthetic medications included Akten 3.5%.   Procedure Preparation included Tobramycin 0.3%. A 30 gauge needle was used.   Injection:  2.5 mg Bevacizumab (AVASTIN) 2.5mg /0.45mL SOSY   NDC: 42683-419-62, Lot: [id number]   Route: Intravitreal, Site: Right Eye  Post-op Post injection exam found visual acuity of at least counting fingers. The patient tolerated the procedure well. There were no complications. The patient received written and verbal post procedure care education. Post injection medications were not given.                 ASSESSMENT/PLAN:  Exudative age-related macular degeneration of right eye with active  choroidal neovascularization (HCC) The nature of wet macular degeneration was discussed with the patient.  Forms of therapy reviewed include the use of Anti-VEGF medications injected painlessly into the eye, as well as other possible treatment modalities, including thermal laser therapy. Fellow eye involvement and risks were discussed with the patient. Upon the finding of wet age related macular degeneration, treatment will be offered. The treatment regimen is on a treat as needed basis with the intent to treat if necessary and extend interval of exams when possible. On average 1 out of 6 patients do not need lifetime therapy. However, the risk of recurrent disease is high for a lifetime.  Initially monthly, then periodic, examinations and evaluations will determine whether the next treatment is required on the day of the examination.The nature of wet macular degeneration was discussed with the patient.  Forms of therapy reviewed include the use of Anti-VEGF medications injected painlessly into the eye, as well as other possible treatment modalities, including thermal laser therapy. Fellow eye involvement and risks were discussed with the patient. Upon the finding of wet age related macular degeneration, treatment will be offered. The treatment regimen is on a treat as needed basis with the intent to treat if necessary and extend interval of exams when possible. On average 1 out of 6 patients do not need lifetime therapy. However, the risk of recurrent disease is high for a lifetime.  Initially monthly, then periodic, examinations and evaluations will determine whether the next treatment is required on the day of the examination.The nature of wet macular degeneration was discussed with the patient.  Forms of therapy reviewed include the use of Anti-VEGF medications injected painlessly into the eye, as well as other possible treatment modalities, including thermal laser therapy. Fellow eye involvement and risks were  discussed  with the patient. Upon the finding of wet age related macular degeneration, treatment will be offered. The treatment regimen is on a treat as needed basis with the intent to treat if necessary and extend interval of exams when possible. On average 1 out of 6 patients do not need lifetime therapy. However, the risk of recurrent disease is high for a lifetime.  Initially monthly, then periodic, examinations and evaluations will determine whether the next treatment is required on the day of the examination.  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  We will repeat injection today and examination again in 5 weeks  Intermediate stage nonexudative age-related macular degeneration of left eye No signs of CNVM by OCT  Retinal layer separation As a component of of CNVM, stable      ICD-10-CM   1. Exudative age-related macular degeneration of right eye with active choroidal neovascularization (HCC)  H35.3211 OCT, Retina - OU - Both Eyes    Intravitreal Injection, Pharmacologic Agent - OD - Right Eye    bevacizumab (AVASTIN) SOSY 2.5 mg  2. Intermediate stage nonexudative age-related macular degeneration of left eye  H35.3122   3. Retinal layer separation  H35.70     1.  2.  3.  Ophthalmic Meds Ordered this visit:  Meds ordered this encounter  Medications  . bevacizumab (AVASTIN) SOSY 2.5 mg       Return in about 5 weeks (around 10/26/2020) for DILATE OU, AVASTIN OCT, OD.  There are no Patient Instructions on file for this visit.   Explained the diagnoses, plan, and follow up with the patient and they expressed understanding.  Patient expressed understanding of the importance of proper follow up care.   Clent Demark [place] M.D. Diseases & Surgery of the Retina and Vitreous [facility] 09/21/20     Abbreviations: M myopia (nearsighted); A astigmatism; H hyperopia (farsighted); P presbyopia; Mrx spectacle  prescription;  CTL contact lenses; OD right eye; OS left eye; OU both eyes  XT exotropia; ET esotropia; PEK punctate epithelial keratitis; PEE punctate epithelial erosions; DES dry eye syndrome; MGD meibomian gland dysfunction; ATs artificial tears; PFAT's preservative free artificial tears; [place] nuclear sclerotic cataract; PSC posterior subcapsular cataract; ERM epi-retinal membrane; PVD posterior vitreous detachment; RD retinal detachment; DM diabetes mellitus; DR diabetic retinopathy; NPDR non-proliferative diabetic retinopathy; PDR proliferative diabetic retinopathy; CSME clinically significant macular edema; DME diabetic macular edema; dbh dot blot hemorrhages; CWS cotton wool spot; POAG primary open angle glaucoma; C/D cup-to-disc ratio; HVF humphrey visual field; GVF goldmann visual field; OCT optical coherence tomography; IOP intraocular pressure; BRVO Branch retinal vein occlusion; CRVO central retinal vein occlusion; CRAO central retinal artery occlusion; BRAO branch retinal artery occlusion; RT retinal tear; SB scleral buckle; PPV pars plana vitrectomy; VH Vitreous hemorrhage; PRP panretinal laser photocoagulation; IVK intravitreal kenalog; VMT vitreomacular traction; MH Macular hole;  NVD neovascularization of the disc; NVE neovascularization elsewhere; AREDS age related eye disease study; ARMD age related macular degeneration; POAG primary open angle glaucoma; EBMD epithelial/anterior basement membrane dystrophy; ACIOL anterior chamber intraocular lens; IOL intraocular lens; PCIOL posterior chamber intraocular lens; Phaco/IOL phacoemulsification with intraocular lens placement; PRK photorefractive keratectomy; LASIK laser assisted in situ keratomileusis; HTN hypertension; DM diabetes mellitus; COPD chronic obstructive pulmonary disease   09/21/2020     CHIEF COMPLAINT Patient presents for Retina Follow Up (5 Week Wet AMD f\u OD. Possible Avastin OD. OCT/Pt states vision is about the same. Denies  new complaints.)   HISTORY OF PRESENT  ILLNESS: Michelle Gonzales is a 80 y.o. female who presents to the clinic today for:   HPI    Retina Follow Up    Patient presents with  Wet AMD.  In right eye.  Severity is moderate.  Duration of 5 weeks.  Since onset it is stable.  I, the attending physician,  performed the HPI with the patient and updated documentation appropriately. Additional comments: 5 Week Wet AMD f\u OD. Possible Avastin OD. OCT Pt states vision is about the same. Denies new complaints.       Last edited by Tilda Franco on 09/21/2020  1:58 PM. (History)      Referring physician: [name], MD [place],  [place] [id number]  HISTORICAL INFORMATION:   Selected notes from the MEDICAL RECORD NUMBER    Lab Results  Component Value Date   HGBA1C 5.7 (H) 09/21/2017     CURRENT MEDICATIONS: Current Outpatient Medications (Ophthalmic Drugs)  Medication Sig  . Carboxymethylcellul-Glycerin (LUBRICATING EYE DROPS OP) Place 1 drop into both eyes daily as needed (Dry Eyes).   No current facility-administered medications for this visit. (Ophthalmic Drugs)   Current Outpatient Medications (Other)  Medication Sig  . apixaban (ELIQUIS) 5 MG TABS tablet Take 1 tablet (5 mg total) by mouth 2 (two) times daily.  . beta carotene w/minerals (OCUVITE) tablet Take 1 tablet by mouth daily.  . cholecalciferol (VITAMIN D) 1000 units tablet Take 1,000 Units by mouth daily.   . metoprolol tartrate (LOPRESSOR) 25 MG tablet Take 1 tablet (25 mg total) by mouth 2 (two) times daily.  . Misc Natural Products (OSTEO BI-FLEX TRIPLE STRENGTH PO) Take 1 tablet by mouth daily.  . montelukast (SINGULAIR) 10 MG tablet Take 10 mg by mouth at bedtime.   . sertraline (ZOLOFT) 50 MG tablet Take 50 mg by mouth daily.  . traZODone (DESYREL) 50 MG tablet Take 0.5 tablets by mouth at bedtime as needed.  . vitamin C (ASCORBIC ACID) 500 MG tablet Take 500 mg by mouth daily.    No  current facility-administered medications for this visit. (Other)      REVIEW OF SYSTEMS:    ALLERGIES Allergies  Allergen Reactions  . Demerol [Meperidine] Nausea And Vomiting and Other (See Comments)    Bad headache ,  Nausea/Vomiting.  Marland Kitchen Penicillins     Has patient had a PCN reaction causing immediate rash, facial/tongue/throat swelling, SOB or lightheadedness with hypotension: YES Has patient had a PCN reaction causing severe rash involving mucus membranes or skin necrosis: NO Has patient had a PCN reaction that required hospitalization: NO Has patient had a PCN reaction occurring within the last 10 years: NO If all of the above answers are "NO", then may proceed with Cephalosporin use.   . Codeine Other (See Comments)    Massive headache  . Sulfa Antibiotics Itching and Rash    PAST MEDICAL HISTORY Past Medical History:  Diagnosis Date  . Allergy    Pcn, Sulfa, Codeine  . Anxiety   . Arthritis   . Atrial fibrillation ([facility]) 09/22/2017  . Cataract    surgey b/l  . COPD (chronic obstructive pulmonary disease) ([place])   . Cough   . Depression    hx years ago  . Hordeolum internum right lower eyelid 05/11/2020  . Hot flashes   . Shortness of breath   . Sjogren's disease [facility])    Past Surgical History:  Procedure Laterality Date  . ABDOMINAL HYSTERECTOMY  74  partial  . BREAST LUMPECTOMY WITH NEEDLE LOCALIZATION AND AXILLARY SENTINEL LYMPH NODE BX Right 02/01/2013   Procedure: BREAST LUMPECTOMY WITH NEEDLE LOCALIZATION AND AXILLARY SENTINEL LYMPH NODE BX;  Surgeon: Odis Hollingshead, MD;  Location: [facility];  Service: General;  Laterality: Right;  . BREAST SURGERY Right 1965   biopsy  . CERVICAL FUSION  2010  . CHOLECYSTECTOMY  2002  . COLONOSCOPY    . DILATION AND CURETTAGE OF UTERUS    . LEFT HEART CATH AND CORONARY ANGIOGRAPHY N/A 09/24/2017   Procedure: LEFT HEART CATH AND CORONARY ANGIOGRAPHY;  Surgeon: Leonie Man, MD;  Location: [facility];  Service: Cardiovascular;  Laterality: N/A;  . ULTRASOUND GUIDANCE FOR VASCULAR ACCESS Right 09/24/2017   Procedure: Ultrasound Guidance For Vascular Access;  Surgeon: Leonie Man, MD;  Location: [facility];  Service: Cardiovascular;  Laterality: Right;    FAMILY HISTORY Family History  Problem Relation Age of Onset  . Breast cancer Mother     SOCIAL HISTORY Social History   Tobacco Use  . Smoking status: Former Smoker    Packs/day: 0.50    Types: Cigarettes    Quit date: 10/07/2017    Years since quitting: 2.9  . Smokeless tobacco: Never Used  Vaping Use  . Vaping Use: Never used  Substance Use Topics  . Alcohol use: Yes    Comment: rare  . Drug use: No         OPHTHALMIC EXAM: Base Eye Exam    Visual Acuity (Snellen - Linear)      Right Left   Dist [place] 20/40 -1 20/60 -2   Dist ph [place] NI 20/40       Tonometry (Tonopen, 2:02 PM)      Right Left   Pressure 9 10       Pupils      Pupils Dark Light Shape React APD   Right PERRL 4 3 Round Slow None   Left PERRL 4 3 Round Slow None       Visual Fields (Counting fingers)      Left Right    Full Full       Neuro/Psych    Oriented x3: Yes   Mood/Affect: Normal       Dilation    Right eye: 1.0% Mydriacyl, 2.5% Phenylephrine @ 2:02 PM        Slit Lamp and Fundus Exam    External Exam      Right Left   External Normal Normal       Slit Lamp Exam      Right Left   Lids/Lashes Internal hordeolum - lower lid, resolved, inspissated oral glands, meibomitis, rosacea blepharitis stable Normal   Conjunctiva/Sclera White and quiet White and quiet   Cornea Clear Clear   Anterior Chamber Deep and quiet Deep and quiet   Iris Round and reactive Round and reactive   Lens Posterior chamber intraocular lens Posterior chamber intraocular lens   Anterior Vitreous Normal Normal       Fundus Exam      Right Left   Posterior Vitreous Posterior vitreous detachment, Central vitreous floaters     Disc Normal    C/D Ratio 0.5    Macula Subretinal neovascular membrane improved , Retinal pigment epithelial mottling,  stable Macular thickening, RPE detachment, no retinal hemorrhage    Vessels Normal    Periphery Normal           IMAGING AND  PROCEDURES  Imaging and Procedures for 09/21/20  OCT, Retina - OU - Both Eyes       Right Eye Quality was good. Scan locations included subfoveal. Central Foveal Thickness: 250. Progression has been stable. Findings include subretinal fluid, pigment epithelial detachment.   Left Eye Quality was good. Scan locations included subfoveal. Central Foveal Thickness: 238. Progression has been stable. Findings include abnormal foveal contour, no IRF, no SRF.   Notes Persistent subretinal fluid, today at 5-week follow-up OD, will repeat injection Avastin today and maintain interval examination next to 5 weeks right eye  OS no active disease       Intravitreal Injection, Pharmacologic Agent - OD - Right Eye       Time Out 09/21/2020. 2:52 PM. Confirmed correct patient, procedure, site, and patient consented.   Anesthesia Topical anesthesia was used. Anesthetic medications included Akten 3.5%.   Procedure Preparation included Tobramycin 0.3%. A 30 gauge needle was used.   Injection:  2.5 mg Bevacizumab (AVASTIN) 2.5mg /0.44mL SOSY   NDC: 78295-621-30, Lot: [id number]   Route: Intravitreal, Site: Right Eye  Post-op Post injection exam found visual acuity of at least counting fingers. The patient tolerated the procedure well. There were no complications. The patient received written and verbal post procedure care education. Post injection medications were not given.                 ASSESSMENT/PLAN:  Exudative age-related macular degeneration of right eye with active choroidal neovascularization (HCC) The nature of wet macular degeneration was discussed with the patient.  Forms of therapy reviewed include the use of Anti-VEGF  medications injected painlessly into the eye, as well as other possible treatment modalities, including thermal laser therapy. Fellow eye involvement and risks were discussed with the patient. Upon the finding of wet age related macular degeneration, treatment will be offered. The treatment regimen is on a treat as needed basis with the intent to treat if necessary and extend interval of exams when possible. On average 1 out of 6 patients do not need lifetime therapy. However, the risk of recurrent disease is high for a lifetime.  Initially monthly, then periodic, examinations and evaluations will determine whether the next treatment is required on the day of the examination.The nature of wet macular degeneration was discussed with the patient.  Forms of therapy reviewed include the use of Anti-VEGF medications injected painlessly into the eye, as well as other possible treatment modalities, including thermal laser therapy. Fellow eye involvement and risks were discussed with the patient. Upon the finding of wet age related macular degeneration, treatment will be offered. The treatment regimen is on a treat as needed basis with the intent to treat if necessary and extend interval of exams when possible. On average 1 out of 6 patients do not need lifetime therapy. However, the risk of recurrent disease is high for a lifetime.  Initially monthly, then periodic, examinations and evaluations will determine whether the next treatment is required on the day of the examination.The nature of wet macular degeneration was discussed with the patient.  Forms of therapy reviewed include the use of Anti-VEGF medications injected painlessly into the eye, as well as other possible treatment modalities, including thermal laser therapy. Fellow eye involvement and risks were discussed with the patient. Upon the finding of wet age related macular degeneration, treatment will be offered. The treatment regimen is on a treat as needed  basis with the intent to treat if necessary and extend interval of exams when possible.  On average 1 out of 6 patients do not need lifetime therapy. However, the risk of recurrent disease is high for a lifetime.  Initially monthly, then periodic, examinations and evaluations will determine whether the next treatment is required on the day of the examination.  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  We will repeat injection today and examination again in 5 weeks  Intermediate stage nonexudative age-related macular degeneration of left eye No signs of CNVM by OCT  Retinal layer separation As a component of of CNVM, stable      ICD-10-CM   1. Exudative age-related macular degeneration of right eye with active choroidal neovascularization (HCC)  H35.3211 OCT, Retina - OU - Both Eyes    Intravitreal Injection, Pharmacologic Agent - OD - Right Eye    bevacizumab (AVASTIN) SOSY 2.5 mg  2. Intermediate stage nonexudative age-related macular degeneration of left eye  H35.3122   3. Retinal layer separation  H35.70     1.  Wet ARMD OD, subfoveal with persistent nonturbid subretinal fluid, serous elevation has component of CNVM yet improved acuity at shorter interval today of 5 weeks.  Repeat injection Avastin today and examination again in 5 weeks  2.  Dilate OU next  3.  Ophthalmic Meds Ordered this visit:  Meds ordered this encounter  Medications  . bevacizumab (AVASTIN) SOSY 2.5 mg       Return in about 5 weeks (around 10/26/2020) for DILATE OU, AVASTIN OCT, OD.  There are no Patient Instructions on file for this visit.   Explained the diagnoses, plan, and follow up with the patient and they expressed understanding.  Patient expressed understanding of the importance of proper follow up care.   Clent Demark Artasia Thang M.D. Diseases & Surgery of the Retina and Vitreous Retina & Diabetic [place] 09/21/20     Abbreviations: M myopia  (nearsighted); A astigmatism; H hyperopia (farsighted); P presbyopia; Mrx spectacle prescription;  CTL contact lenses; OD right eye; OS left eye; OU both eyes  XT exotropia; ET esotropia; PEK punctate epithelial keratitis; PEE punctate epithelial erosions; DES dry eye syndrome; MGD meibomian gland dysfunction; ATs artificial tears; PFAT's preservative free artificial tears; [place] nuclear sclerotic cataract; PSC posterior subcapsular cataract; ERM epi-retinal membrane; PVD posterior vitreous detachment; RD retinal detachment; DM diabetes mellitus; DR diabetic retinopathy; NPDR non-proliferative diabetic retinopathy; PDR proliferative diabetic retinopathy; CSME clinically significant macular edema; DME diabetic macular edema; dbh dot blot hemorrhages; CWS cotton wool spot; POAG primary open angle glaucoma; C/D cup-to-disc ratio; HVF humphrey visual field; GVF goldmann visual field; OCT optical coherence tomography; IOP intraocular pressure; BRVO Branch retinal vein occlusion; CRVO central retinal vein occlusion; CRAO central retinal artery occlusion; BRAO branch retinal artery occlusion; RT retinal tear; SB scleral buckle; PPV pars plana vitrectomy; VH Vitreous hemorrhage; PRP panretinal laser photocoagulation; IVK intravitreal kenalog; VMT vitreomacular traction; MH Macular hole;  NVD neovascularization of the disc; NVE neovascularization elsewhere; AREDS age related eye disease study; ARMD age related macular degeneration; POAG primary open angle glaucoma; EBMD epithelial/anterior basement membrane dystrophy; ACIOL anterior chamber intraocular lens; IOL intraocular lens; PCIOL posterior chamber intraocular lens; Phaco/IOL phacoemulsification with intraocular lens placement; [place] photorefractive keratectomy; LASIK laser assisted in situ keratomileusis; HTN hypertension; DM diabetes mellitus; COPD chronic obstructive pulmonary disease

## 2020-10-03 ENCOUNTER — Encounter: Payer: Self-pay | Admitting: Physician Assistant

## 2020-10-03 ENCOUNTER — Ambulatory Visit: Payer: Medicare Other | Admitting: Physician Assistant

## 2020-10-03 ENCOUNTER — Other Ambulatory Visit: Payer: Self-pay

## 2020-10-03 VITALS — BP 132/78 | HR 67 | Ht 60.0 in | Wt 139.6 lb

## 2020-10-03 DIAGNOSIS — I480 Paroxysmal atrial fibrillation: Secondary | ICD-10-CM | POA: Diagnosis not present

## 2020-10-03 DIAGNOSIS — Z720 Tobacco use: Secondary | ICD-10-CM

## 2020-10-03 DIAGNOSIS — I2510 Atherosclerotic heart disease of native coronary artery without angina pectoris: Secondary | ICD-10-CM | POA: Diagnosis not present

## 2020-10-03 LAB — COMPREHENSIVE METABOLIC PANEL WITH GFR
ALT: 11 IU/L (ref 0–32)
AST: 22 IU/L (ref 0–40)
Albumin/Globulin Ratio: 1.5 (ref 1.2–2.2)
Albumin: 4.2 g/dL (ref 3.7–4.7)
Alkaline Phosphatase: 91 IU/L (ref 44–121)
BUN/Creatinine Ratio: 14 (ref 12–28)
BUN: 10 mg/dL (ref 8–27)
Bilirubin Total: 0.5 mg/dL (ref 0.0–1.2)
CO2: 21 mmol/L (ref 20–29)
Calcium: 9.1 mg/dL (ref 8.7–10.3)
Chloride: 95 mmol/L — ABNORMAL LOW (ref 96–106)
Creatinine, Ser: 0.73 mg/dL (ref 0.57–1.00)
Globulin, Total: 2.8 g/dL (ref 1.5–4.5)
Glucose: 85 mg/dL (ref 65–99)
Potassium: 4.6 mmol/L (ref 3.5–5.2)
Sodium: 135 mmol/L (ref 134–144)
Total Protein: 7 g/dL (ref 6.0–8.5)
eGFR: 84 mL/min/1.73

## 2020-10-03 LAB — CBC
Hematocrit: 41.5 % (ref 34.0–46.6)
Hemoglobin: 13.8 g/dL (ref 11.1–15.9)
MCH: 30.2 pg (ref 26.6–33.0)
MCHC: 33.3 g/dL (ref 31.5–35.7)
MCV: 91 fL (ref 79–97)
Platelets: 169 x10E3/uL (ref 150–450)
RBC: 4.57 x10E6/uL (ref 3.77–5.28)
RDW: 12.3 % (ref 11.7–15.4)
WBC: 7.7 x10E3/uL (ref 3.4–10.8)

## 2020-10-03 MED ORDER — VARENICLINE TARTRATE 0.5 MG PO TABS: 0.5000 mg | ORAL_TABLET | ORAL | 0 refills | Status: DC

## 2020-10-03 NOTE — Patient Instructions (Signed)
Medication Instructions:  1) Please resume Eliquis 5 mg twice a day   2) Start Chantix 0.5 mg, Day 1-3 take 1 tablet by mouth daily, Day 4-7 take 1 tablet by mouth twice a day, Day 8 take 2 tablets by mouth.  *If you need a refill on your cardiac medications before your next appointment, please call your pharmacy*   Lab Work: Cbc, Cmp- Today   If you have labs (blood work) drawn today and your tests are completely normal, you will receive your results only by: Marland Kitchen MyChart Message (if you have MyChart) OR . A paper copy in the mail If you have any lab test that is abnormal or we need to change your treatment, we will call you to review the results.   Testing/Procedures: None ordered    Follow-Up: At [facility], you and your health needs are our priority.  As part of our continuing mission to provide you with exceptional heart care, we have created designated Provider Care Teams.  These Care Teams include your primary Cardiologist (physician) and Advanced Practice Providers (APPs -  Physician Assistants and Nurse Practitioners) who all work together to provide you with the care you need, when you need it.  We recommend signing up for the patient portal called "MyChart".  Sign up information is provided on this After Visit Summary.  MyChart is used to connect with patients for Virtual Visits (Telemedicine).  Patients are able to view lab/test results, encounter notes, upcoming appointments, etc.  Non-urgent messages can be sent to your provider as well.   To learn more about what you can do with MyChart, go to [email or web address].    Your next appointment:   6 month(s)  The format for your next appointment:   In Person  Provider:   You may see Mertie Moores, MD or one of the following Advanced Practice Providers on your designated Care Team:    Richardson Dopp, PA-C  Robbie Lis, [place]    Other Instructions None

## 2020-10-05 ENCOUNTER — Telehealth: Payer: Self-pay | Admitting: Cardiovascular Disease

## 2020-10-05 MED ORDER — VARENICLINE TARTRATE 0.5 MG PO TABS: 0.5000 mg | ORAL_TABLET | ORAL | 0 refills | Status: DC

## 2020-10-05 NOTE — Telephone Encounter (Signed)
Pt c/o medication issue:  1. Name of Medication: varenicline (CHANTIX) 0.5 MG tablet  2. How are you currently taking this medication (dosage and times per day)? n/a  3. Are you having a reaction (difficulty breathing--STAT)? no  4. What is your medication issue? [name] from [facility] is calling to verify the quantity. Instructions state: "Day 1-3 take 1 tablet by mouth daily, Day 4-7 take 1 tablet by mouth twice a day, Day 8 take 2 tablets by mouth" but there was only a quantity of 10 tablets submitted.

## 2020-10-05 NOTE — Telephone Encounter (Signed)
Correct quantity has been sent to the pharmacy.

## 2020-10-05 NOTE — Addendum Note (Signed)
Addended by: Mendel Ryder on: 10/05/2020 05:25 PM   Modules accepted: Orders

## 2020-10-24 ENCOUNTER — Telehealth: Payer: Self-pay | Admitting: Pharmacist

## 2020-10-24 MED ORDER — ELIQUIS 5 MG PO TABS: 5.0000 mg | ORAL_TABLET | Freq: Two times a day (BID) | ORAL | 1 refills | Status: DC

## 2020-10-24 NOTE — Telephone Encounter (Signed)
Prescription refill request for Eliquis received. Indication: a fib Last office visit: 10/03/20 Scr: 0.73 Age: 80 Weight: 63kg

## 2020-10-26 ENCOUNTER — Encounter (INDEPENDENT_AMBULATORY_CARE_PROVIDER_SITE_OTHER): Payer: Medicare Other | Admitting: Ophthalmology

## 2020-11-20 ENCOUNTER — Other Ambulatory Visit: Payer: Self-pay | Admitting: *Deleted

## 2020-11-20 MED ORDER — ELIQUIS 5 MG PO TABS: 5.0000 mg | ORAL_TABLET | Freq: Two times a day (BID) | ORAL | 0 refills | Status: DC

## 2020-11-20 MED ORDER — METOPROLOL TARTRATE 25 MG PO TABS: 25.0000 mg | ORAL_TABLET | Freq: Two times a day (BID) | ORAL | 0 refills | Status: DC

## 2020-11-20 NOTE — Telephone Encounter (Signed)
Prescription refill request for Eliquis received. Indication: afib  Last office visit: [place], 10/03/2020 Scr: 0.73, 10/03/2020 Age: 80 yo  Weight: 63.3 kg   Pt is on the correct dose of Eliquis per dosing criteria, prescription refill sent for Eliquis 5mg  BID.

## 2020-11-23 ENCOUNTER — Encounter (INDEPENDENT_AMBULATORY_CARE_PROVIDER_SITE_OTHER): Payer: Medicare Other | Admitting: Ophthalmology

## 2020-12-07 ENCOUNTER — Encounter (INDEPENDENT_AMBULATORY_CARE_PROVIDER_SITE_OTHER): Payer: Medicare Other | Admitting: Ophthalmology

## 2020-12-11 ENCOUNTER — Other Ambulatory Visit: Payer: Self-pay

## 2020-12-11 ENCOUNTER — Encounter (INDEPENDENT_AMBULATORY_CARE_PROVIDER_SITE_OTHER): Payer: Self-pay | Admitting: Ophthalmology

## 2020-12-11 ENCOUNTER — Ambulatory Visit (INDEPENDENT_AMBULATORY_CARE_PROVIDER_SITE_OTHER): Payer: Medicare Other | Admitting: Ophthalmology

## 2020-12-11 DIAGNOSIS — H353122 Nonexudative age-related macular degeneration, left eye, intermediate dry stage: Secondary | ICD-10-CM | POA: Diagnosis not present

## 2020-12-11 DIAGNOSIS — H43811 Vitreous degeneration, right eye: Secondary | ICD-10-CM

## 2020-12-11 DIAGNOSIS — H353211 Exudative age-related macular degeneration, right eye, with active choroidal neovascularization: Secondary | ICD-10-CM | POA: Diagnosis not present

## 2020-12-11 MED ORDER — BEVACIZUMAB 2.5 MG/0.1ML IZ SOSY
2.5000 mg | PREFILLED_SYRINGE | INTRAVITREAL | Status: AC | PRN
  Administered 2020-12-11: 2.5 mg via INTRAVITREAL

## 2020-12-11 NOTE — Assessment & Plan Note (Signed)
No holes or tears 

## 2020-12-11 NOTE — Progress Notes (Signed)
12/11/2020     CHIEF COMPLAINT Patient presents for Retina Follow Up (5 week fu OU and Avastin OD/Pt states VA OU stable since last visit. Pt denies FOL, floaters, or ocular pain OU. /)   HISTORY OF PRESENT ILLNESS: Michelle Gonzales is a 80 y.o. female who presents to the clinic today for:   HPI    Retina Follow Up    Diagnosis: Wet AMD   Laterality: right eye   Onset: 5 weeks ago   Severity: mild   Duration: 5 weeks   Course: stable   Comments: 5 week fu OU and Avastin OD Pt states VA OU stable since last visit. Pt denies FOL, floaters, or ocular pain OU.         Last edited by Kendra Opitz, COA on 12/11/2020 10:27 AM. (History)      Referring physician: [name], MD [place],  [place] [address]  HISTORICAL INFORMATION:   Selected notes from the MEDICAL RECORD NUMBER    Lab Results  Component Value Date   HGBA1C 5.7 (H) 09/21/2017     CURRENT MEDICATIONS: Current Outpatient Medications (Ophthalmic Drugs)  Medication Sig  . Carboxymethylcellul-Glycerin (LUBRICATING EYE DROPS OP) Place 1 drop into both eyes daily as needed (Dry Eyes).   No current facility-administered medications for this visit. (Ophthalmic Drugs)   Current Outpatient Medications (Other)  Medication Sig  . apixaban (ELIQUIS) 5 MG TABS tablet Take 1 tablet (5 mg total) by mouth 2 (two) times daily.  . beta carotene w/minerals (OCUVITE) tablet Take 1 tablet by mouth daily.  . cholecalciferol (VITAMIN D) 1000 units tablet Take 1,000 Units by mouth daily.   . metoprolol tartrate (LOPRESSOR) 25 MG tablet Take 1 tablet (25 mg total) by mouth 2 (two) times daily.  . Misc Natural Products (OSTEO BI-FLEX TRIPLE STRENGTH PO) Take 1 tablet by mouth daily.  . montelukast (SINGULAIR) 10 MG tablet Take 10 mg by mouth at bedtime.   . sertraline (ZOLOFT) 50 MG tablet Take 50 mg by mouth daily.  . traZODone (DESYREL) 50 MG tablet Take 0.5 tablets by mouth at bedtime as needed.   . varenicline (CHANTIX) 0.5 MG tablet Take 1 tablet (0.5 mg total) by mouth as directed.  . vitamin C (ASCORBIC ACID) 500 MG tablet Take 500 mg by mouth daily.    No current facility-administered medications for this visit. (Other)      REVIEW OF SYSTEMS:    ALLERGIES Allergies  Allergen Reactions  . Demerol [Meperidine] Nausea And Vomiting and Other (See Comments)    Bad headache ,  Nausea/Vomiting.  Marland Kitchen Penicillins     Has patient had a PCN reaction causing immediate rash, facial/tongue/throat swelling, SOB or lightheadedness with hypotension: YES Has patient had a PCN reaction causing severe rash involving mucus membranes or skin necrosis: NO Has patient had a PCN reaction that required hospitalization: NO Has patient had a PCN reaction occurring within the last 10 years: NO If all of the above answers are "NO", then may proceed with Cephalosporin use.   . Codeine Other (See Comments)    Massive headache  . Sulfa Antibiotics Itching and Rash    PAST MEDICAL HISTORY Past Medical History:  Diagnosis Date  . Allergy    Pcn, Sulfa, Codeine  . Anxiety   . Arthritis   . Atrial fibrillation ([place]) 09/22/2017  . Cataract    surgey b/l  . COPD (chronic obstructive pulmonary disease) ([place])   .  Cough   . Depression    hx years ago  . Hordeolum internum right lower eyelid 05/11/2020  . Hot flashes   . Shortness of breath   . Sjogren's disease [facility])    Past Surgical History:  Procedure Laterality Date  . ABDOMINAL HYSTERECTOMY  74   partial  . BREAST LUMPECTOMY WITH NEEDLE LOCALIZATION AND AXILLARY SENTINEL LYMPH NODE BX Right 02/01/2013   Procedure: BREAST LUMPECTOMY WITH NEEDLE LOCALIZATION AND AXILLARY SENTINEL LYMPH NODE BX;  Surgeon: Adolph Pollack, MD;  Location: [facility];  Service: General;  Laterality: Right;  . BREAST SURGERY Right 1965   biopsy  . CERVICAL FUSION  2010  . CHOLECYSTECTOMY  2002  . COLONOSCOPY    . DILATION AND CURETTAGE OF  UTERUS    . LEFT HEART CATH AND CORONARY ANGIOGRAPHY N/A 09/24/2017   Procedure: LEFT HEART CATH AND CORONARY ANGIOGRAPHY;  Surgeon: Marykay Lex, MD;  Location: [facility] INVASIVE CV LAB;  Service: Cardiovascular;  Laterality: N/A;  . ULTRASOUND GUIDANCE FOR VASCULAR ACCESS Right 09/24/2017   Procedure: Ultrasound Guidance For Vascular Access;  Surgeon: Marykay Lex, MD;  Location: [facility] INVASIVE CV LAB;  Service: Cardiovascular;  Laterality: Right;    FAMILY HISTORY Family History  Problem Relation Age of Onset  . Breast cancer Mother     SOCIAL HISTORY Social History   Tobacco Use  . Smoking status: Former Smoker    Packs/day: 0.50    Types: Cigarettes    Quit date: 10/07/2017    Years since quitting: 3.1  . Smokeless tobacco: Never Used  Vaping Use  . Vaping Use: Never used  Substance Use Topics  . Alcohol use: Yes    Comment: rare  . Drug use: No         OPHTHALMIC EXAM: Base Eye Exam    Visual Acuity (ETDRS)      Right Left   Dist [place] 20/40 20/50 -1   Dist ph [place] NI NI       Tonometry (Tonopen, 10:30 AM)      Right Left   Pressure 13 12       Pupils      Pupils Dark Light Shape React APD   Right PERRL 4 3 Round Slow None   Left PERRL 4 3 Round Slow None       Visual Fields (Counting fingers)      Left Right    Full Full       Extraocular Movement      Right Left    Full Full       Neuro/Psych    Oriented x3: Yes   Mood/Affect: Normal       Dilation    Both eyes: 1.0% Mydriacyl, 2.5% Phenylephrine @ 10:30 AM        Slit Lamp and Fundus Exam    External Exam      Right Left   External Normal Normal       Slit Lamp Exam      Right Left   Lids/Lashes Internal hordeolum - lower lid, resolved, inspissated oral glands, meibomitis, rosacea blepharitis stable Normal   Conjunctiva/Sclera White and quiet White and quiet   Cornea Clear Clear   Anterior Chamber Deep and quiet Deep and quiet   Iris Round and reactive Round and reactive   Lens  Posterior chamber intraocular lens Posterior chamber intraocular lens   Anterior Vitreous Normal Normal       Fundus Exam  Right Left   Posterior Vitreous Posterior vitreous detachment, Central vitreous floaters Posterior vitreous detachment, Central vitreous floaters   Disc Normal Normal   C/D Ratio 0.5 0.5   Macula Subretinal neovascular membrane , Retinal pigment epithelial mottling,  stable Macular thickening, RPE detachment, no retinal hemorrhage Hard drusen, no membrane, no hemorrhage   Vessels Normal Normal   Periphery Normal Normal          IMAGING AND PROCEDURES  Imaging and Procedures for 12/11/20  OCT, Retina - OU - Both Eyes       Right Eye Quality was good. Scan locations included subfoveal. Central Foveal Thickness: 253. Progression has been stable. Findings include subretinal fluid, pigment epithelial detachment.   Left Eye Quality was good. Scan locations included subfoveal. Central Foveal Thickness: 241. Progression has been stable. Findings include abnormal foveal contour, no IRF, no SRF.   Notes Persistent subretinal fluid, today at 11 -week follow-up OD, will repeat injection Avastin today and maintain interval examination next to 11 weeks right eye  OS no active disease         Intravitreal Injection, Pharmacologic Agent - OD - Right Eye       Time Out 12/11/2020. 11:24 AM. Confirmed correct patient, procedure, site, and patient consented.   Anesthesia Topical anesthesia was used. Anesthetic medications included Akten 3.5%.   Procedure Preparation included Tobramycin 0.3%. A 30 gauge needle was used.   Injection:  2.5 mg Bevacizumab (AVASTIN) 2.5mg /0.36mL SOSY   NDC: 35573-220-25, Lot: [id number]   Route: Intravitreal, Site: Right Eye  Post-op Post injection exam found visual acuity of at least counting fingers. The patient tolerated the procedure well. There were no complications. The patient received written and verbal post procedure  care education. Post injection medications were not given.                 ASSESSMENT/PLAN:  Exudative age-related macular degeneration of right eye with active choroidal neovascularization (HCC) Active wet ARMD, at 11-week interval with subretinal fluid from vascularized PED, CNVM.  Will need repeat injection Avastin today follow-up in 5 weeks to confirm resolution of disease prior to extension of interval examinations in the future  Intermediate stage nonexudative age-related macular degeneration of left eye No sign of active CNVM formation OS today  Posterior vitreous detachment of right eye No holes or tears      ICD-10-CM   1. Exudative age-related macular degeneration of right eye with active choroidal neovascularization (HCC)  H35.3211 OCT, Retina - OU - Both Eyes    Intravitreal Injection, Pharmacologic Agent - OD - Right Eye    bevacizumab (AVASTIN) SOSY 2.5 mg  2. Intermediate stage nonexudative age-related macular degeneration of left eye  H35.3122   3. Posterior vitreous detachment of right eye  H43.811     1.  OD, with continued active CNVM vascularized PED associated.  We will repeat injection today at 11-week interval and shorten interval examination to induce resolution of this active lesion.  Visual acuity preserved at this time.  2.  No signs of CNVM OS  3.  Ophthalmic Meds Ordered this visit:  Meds ordered this encounter  Medications  . bevacizumab (AVASTIN) SOSY 2.5 mg       Return in about 5 weeks (around 01/15/2021) for dilate, OD, AVASTIN OCT.  There are no Patient Instructions on file for this visit.   Explained the diagnoses, plan, and follow up with the patient and they expressed understanding.  Patient expressed understanding of the importance of  proper follow up care.   Clent Demark Atlee Villers M.D. Diseases & Surgery of the Retina and Vitreous Retina & Diabetic [place] 12/11/20     Abbreviations: M myopia (nearsighted); A astigmatism; H  hyperopia (farsighted); P presbyopia; Mrx spectacle prescription;  CTL contact lenses; OD right eye; OS left eye; OU both eyes  XT exotropia; ET esotropia; PEK punctate epithelial keratitis; PEE punctate epithelial erosions; DES dry eye syndrome; MGD meibomian gland dysfunction; ATs artificial tears; PFAT's preservative free artificial tears; [place] nuclear sclerotic cataract; PSC posterior subcapsular cataract; ERM epi-retinal membrane; PVD posterior vitreous detachment; RD retinal detachment; DM diabetes mellitus; DR diabetic retinopathy; NPDR non-proliferative diabetic retinopathy; PDR proliferative diabetic retinopathy; CSME clinically significant macular edema; DME diabetic macular edema; dbh dot blot hemorrhages; CWS cotton wool spot; POAG primary open angle glaucoma; C/D cup-to-disc ratio; HVF humphrey visual field; GVF goldmann visual field; OCT optical coherence tomography; IOP intraocular pressure; BRVO Branch retinal vein occlusion; CRVO central retinal vein occlusion; CRAO central retinal artery occlusion; BRAO branch retinal artery occlusion; RT retinal tear; SB scleral buckle; PPV pars plana vitrectomy; VH Vitreous hemorrhage; PRP panretinal laser photocoagulation; IVK intravitreal kenalog; VMT vitreomacular traction; MH Macular hole;  NVD neovascularization of the disc; NVE neovascularization elsewhere; AREDS age related eye disease study; ARMD age related macular degeneration; POAG primary open angle glaucoma; EBMD epithelial/anterior basement membrane dystrophy; ACIOL anterior chamber intraocular lens; IOL intraocular lens; PCIOL posterior chamber intraocular lens; Phaco/IOL phacoemulsification with intraocular lens placement; [place] photorefractive keratectomy; LASIK laser assisted in situ keratomileusis; HTN hypertension; DM diabetes mellitus; COPD chronic obstructive pulmonary disease

## 2020-12-11 NOTE — Assessment & Plan Note (Signed)
Active wet ARMD, at 11-week interval with subretinal fluid from vascularized PED, CNVM.  Will need repeat injection Avastin today follow-up in 5 weeks to confirm resolution of disease prior to extension of interval examinations in the future

## 2020-12-11 NOTE — Assessment & Plan Note (Signed)
No sign of active CNVM formation OS today

## 2020-12-14 ENCOUNTER — Other Ambulatory Visit: Payer: Self-pay

## 2021-01-15 ENCOUNTER — Ambulatory Visit (INDEPENDENT_AMBULATORY_CARE_PROVIDER_SITE_OTHER): Payer: Medicare Other | Admitting: Ophthalmology

## 2021-01-15 ENCOUNTER — Other Ambulatory Visit: Payer: Self-pay

## 2021-01-15 ENCOUNTER — Encounter (INDEPENDENT_AMBULATORY_CARE_PROVIDER_SITE_OTHER): Payer: Self-pay | Admitting: Ophthalmology

## 2021-01-15 DIAGNOSIS — H353211 Exudative age-related macular degeneration, right eye, with active choroidal neovascularization: Secondary | ICD-10-CM

## 2021-01-15 DIAGNOSIS — H43811 Vitreous degeneration, right eye: Secondary | ICD-10-CM | POA: Diagnosis not present

## 2021-01-15 MED ORDER — BEVACIZUMAB 2.5 MG/0.1ML IZ SOSY
2.5000 mg | PREFILLED_SYRINGE | INTRAVITREAL | Status: AC | PRN
  Administered 2021-01-15: 2.5 mg via INTRAVITREAL

## 2021-01-15 NOTE — Progress Notes (Signed)
01/15/2021     CHIEF COMPLAINT Patient presents for Retina Follow Up (5 week fu OD and Avastin OD/Pt states VA OU stable since last visit. Pt denies FOL, floaters, or ocular pain OU. /)   HISTORY OF PRESENT ILLNESS: Michelle Gonzales is a 80 y.o. female who presents to the clinic today for:   HPI     Retina Follow Up           Diagnosis: Wet AMD   Laterality: right eye   Onset: 5 weeks ago   Severity: mild   Duration: 5 weeks   Course: stable   Comments: 5 week fu OD and Avastin OD Pt states VA OU stable since last visit. Pt denies FOL, floaters, or ocular pain OU.         Last edited by Kendra Opitz, COA on 01/15/2021 10:27 AM.      Referring physician: [name], MD [place],  [place] [id number]  HISTORICAL INFORMATION:   Selected notes from the MEDICAL RECORD NUMBER    Lab Results  Component Value Date   HGBA1C 5.7 (H) 09/21/2017     CURRENT MEDICATIONS: Current Outpatient Medications (Ophthalmic Drugs)  Medication Sig   Carboxymethylcellul-Glycerin (LUBRICATING EYE DROPS OP) Place 1 drop into both eyes daily as needed (Dry Eyes).   No current facility-administered medications for this visit. (Ophthalmic Drugs)   Current Outpatient Medications (Other)  Medication Sig   apixaban (ELIQUIS) 5 MG TABS tablet Take 1 tablet (5 mg total) by mouth 2 (two) times daily.   beta carotene w/minerals (OCUVITE) tablet Take 1 tablet by mouth daily.   cholecalciferol (VITAMIN D) 1000 units tablet Take 1,000 Units by mouth daily.    metoprolol tartrate (LOPRESSOR) 25 MG tablet Take 1 tablet (25 mg total) by mouth 2 (two) times daily.   Misc Natural Products (OSTEO BI-FLEX TRIPLE STRENGTH PO) Take 1 tablet by mouth daily.   montelukast (SINGULAIR) 10 MG tablet Take 10 mg by mouth at bedtime.    sertraline (ZOLOFT) 50 MG tablet Take 50 mg by mouth daily.   traZODone (DESYREL) 50 MG tablet Take 0.5 tablets by mouth at bedtime as needed.    varenicline (CHANTIX) 0.5 MG tablet Take 1 tablet (0.5 mg total) by mouth as directed.   vitamin C (ASCORBIC ACID) 500 MG tablet Take 500 mg by mouth daily.    No current facility-administered medications for this visit. (Other)      REVIEW OF SYSTEMS:    ALLERGIES Allergies  Allergen Reactions   Demerol [Meperidine] Nausea And Vomiting and Other (See Comments)    Bad headache ,  Nausea/Vomiting.   Penicillins     Has patient had a PCN reaction causing immediate rash, facial/tongue/throat swelling, SOB or lightheadedness with hypotension: YES Has patient had a PCN reaction causing severe rash involving mucus membranes or skin necrosis: NO Has patient had a PCN reaction that required hospitalization: NO Has patient had a PCN reaction occurring within the last 10 years: NO If all of the above answers are "NO", then may proceed with Cephalosporin use.    Codeine Other (See Comments)    Massive headache   Sulfa Antibiotics Itching and Rash    PAST MEDICAL HISTORY Past Medical History:  Diagnosis Date   Allergy    Pcn, Sulfa, Codeine   Anxiety    Arthritis    Atrial fibrillation ([place]) 09/22/2017   Cataract    surgey b/l   COPD (chronic  obstructive pulmonary disease) (HCC)    Cough    Depression    hx years ago   Hordeolum internum right lower eyelid 05/11/2020   Hot flashes    Shortness of breath    Sjogren's disease ([place])    Past Surgical History:  Procedure Laterality Date   ABDOMINAL HYSTERECTOMY  73   partial   BREAST LUMPECTOMY WITH NEEDLE LOCALIZATION AND AXILLARY SENTINEL LYMPH NODE BX Right 02/01/2013   Procedure: BREAST LUMPECTOMY WITH NEEDLE LOCALIZATION AND AXILLARY SENTINEL LYMPH NODE BX;  Surgeon: Odis Hollingshead, MD;  Location: [facility];  Service: General;  Laterality: Right;   BREAST SURGERY Right 1965   biopsy   CERVICAL FUSION  2010   CHOLECYSTECTOMY  2002   COLONOSCOPY     DILATION AND CURETTAGE OF UTERUS     LEFT HEART CATH  AND CORONARY ANGIOGRAPHY N/A 09/24/2017   Procedure: LEFT HEART CATH AND CORONARY ANGIOGRAPHY;  Surgeon: Leonie Man, MD;  Location: [facility];  Service: Cardiovascular;  Laterality: N/A;   ULTRASOUND GUIDANCE FOR VASCULAR ACCESS Right 09/24/2017   Procedure: Ultrasound Guidance For Vascular Access;  Surgeon: Leonie Man, MD;  Location: [facility];  Service: Cardiovascular;  Laterality: Right;    FAMILY HISTORY Family History  Problem Relation Age of Onset   Breast cancer Mother     SOCIAL HISTORY Social History   Tobacco Use   Smoking status: Former    Packs/day: 0.50    Pack years: 0.00    Types: Cigarettes    Quit date: 10/07/2017    Years since quitting: 3.2   Smokeless tobacco: Never  Vaping Use   Vaping Use: Never used  Substance Use Topics   Alcohol use: Yes    Comment: rare   Drug use: No         OPHTHALMIC EXAM:  Base Eye Exam     Visual Acuity (ETDRS)       Right Left   Dist [place] 20/40 -1 20/50 +2   Dist ph [place] NI NI         Tonometry (Tonopen, 10:31 AM)       Right Left   Pressure 13 14         Pupils       Pupils Dark Light Shape React APD   Right PERRL 4 3 Round Slow None   Left PERRL 4 3 Round Slow None         Visual Fields (Counting fingers)       Left Right    Full Full         Extraocular Movement       Right Left    Full Full         Neuro/Psych     Oriented x3: Yes   Mood/Affect: Normal         Dilation     Right eye: 1.0% Mydriacyl, 2.5% Phenylephrine @ 10:31 AM           Slit Lamp and Fundus Exam     External Exam       Right Left   External Normal Normal         Slit Lamp Exam       Right Left   Lids/Lashes Internal hordeolum - lower lid, resolved, inspissated oral glands, meibomitis, rosacea blepharitis stable Normal   Conjunctiva/Sclera White and quiet White and quiet   Cornea Clear Clear   Anterior Chamber Deep and  quiet Deep and quiet   Iris Round and  reactive Round and reactive   Lens Posterior chamber intraocular lens Posterior chamber intraocular lens   Anterior Vitreous Normal Normal         Fundus Exam       Right Left   Posterior Vitreous Posterior vitreous detachment, Central vitreous floaters    Disc Normal    C/D Ratio 0.5    Macula Subretinal neovascular membrane improved , Retinal pigment epithelial mottling,  stable Macular thickening, RPE detachment, no retinal hemorrhage    Vessels Normal    Periphery Normal             IMAGING AND PROCEDURES  Imaging and Procedures for 01/15/21  OCT, Retina - OU - Both Eyes       Right Eye Quality was good. Scan locations included subfoveal. Central Foveal Thickness: 241. Progression has been stable. Findings include subretinal fluid, pigment epithelial detachment.   Left Eye Quality was good. Scan locations included subfoveal. Central Foveal Thickness: 236. Progression has been stable. Findings include abnormal foveal contour, no IRF, no SRF.   Notes Persistent subretinal fluid, today at 5-week follow-up OD, will repeat injection Avastin today and maintain interval examination next to 5 weeks right eye  OS no active disease       Intravitreal Injection, Pharmacologic Agent - OD - Right Eye       Time Out 01/15/2021. 11:09 AM. Confirmed correct patient, procedure, site, and patient consented.   Anesthesia Topical anesthesia was used. Anesthetic medications included Akten 3.5%.   Procedure Preparation included Tobramycin 0.3%, 10% betadine to eyelids, 5% betadine to ocular surface. A 30 gauge needle was used.   Injection: 2.5 mg bevacizumab 2.5 MG/0.1ML   Route: Intravitreal, Site: Right Eye   NDC: [phone], Lot: [id number]   Post-op Post injection exam found visual acuity of at least counting fingers. The patient tolerated the procedure well. There were no complications. The patient received written and verbal post procedure care education. Post  injection medications were not given.              ASSESSMENT/PLAN:  Exudative age-related macular degeneration of right eye with active choroidal neovascularization (HCC) CNVM with vascularized PED subfoveal OD, still active at 5-week interval today.  Repeat injection today and examination next 5 weeks  Posterior vitreous detachment of right eye  The nature of posterior vitreous detachment was discussed with the patient as well as its physiology, its age prevalence, and its possible implication regarding retinal breaks and detachment.  An informational brochure was offered to the patient.  All the patient's questions were answered.  The patient was asked to return if new or different flashes or floaters develops.   Patient was instructed to contact office immediately if any new changes were noticed. I explained to the patient that vitreous inside the eye is similar to jello inside a bowl. As the jello melts it can start to pull away from the bowl, similarly the vitreous throughout our lives can begin to pull away from the retina. That process is called a posterior vitreous detachment. In some cases, the vitreous can tug hard enough on the retina to form a retinal tear. I discussed with the patient the signs and symptoms of a retinal detachment.  Do not rub the eye.       ICD-10-CM   1. Exudative age-related macular degeneration of right eye with active choroidal neovascularization (HCC)  H35.3211 OCT, Retina - OU - Both Eyes  Intravitreal Injection, Pharmacologic Agent - OD - Right Eye    bevacizumab (AVASTIN) SOSY 2.5 mg    2. Posterior vitreous detachment of right eye  H43.811       1.  Chronic active and even recurrent CNVM with vascularized pigment epithelial detachment subfoveal location.  Currently at 5-week follow-up interval.  We will repeat injection OD today and examination next OD in 5 weeks  2.  3.  Ophthalmic Meds Ordered this visit:  Meds ordered this encounter   Medications   bevacizumab (AVASTIN) SOSY 2.5 mg       Return in about 5 weeks (around 02/19/2021) for dilate, OD, AVASTIN OCT.  There are no Patient Instructions on file for this visit.   Explained the diagnoses, plan, and follow up with the patient and they expressed understanding.  Patient expressed understanding of the importance of proper follow up care.   Clent Demark Adonai Selsor M.D. Diseases & Surgery of the Retina and Vitreous Retina & Diabetic [place] 01/15/21     Abbreviations: M myopia (nearsighted); A astigmatism; H hyperopia (farsighted); P presbyopia; Mrx spectacle prescription;  CTL contact lenses; OD right eye; OS left eye; OU both eyes  XT exotropia; ET esotropia; PEK punctate epithelial keratitis; PEE punctate epithelial erosions; DES dry eye syndrome; MGD meibomian gland dysfunction; ATs artificial tears; PFAT's preservative free artificial tears; [place] nuclear sclerotic cataract; PSC posterior subcapsular cataract; ERM epi-retinal membrane; PVD posterior vitreous detachment; RD retinal detachment; DM diabetes mellitus; DR diabetic retinopathy; NPDR non-proliferative diabetic retinopathy; PDR proliferative diabetic retinopathy; CSME clinically significant macular edema; DME diabetic macular edema; dbh dot blot hemorrhages; CWS cotton wool spot; POAG primary open angle glaucoma; C/D cup-to-disc ratio; HVF humphrey visual field; GVF goldmann visual field; OCT optical coherence tomography; IOP intraocular pressure; BRVO Branch retinal vein occlusion; CRVO central retinal vein occlusion; CRAO central retinal artery occlusion; BRAO branch retinal artery occlusion; RT retinal tear; SB scleral buckle; PPV pars plana vitrectomy; VH Vitreous hemorrhage; PRP panretinal laser photocoagulation; IVK intravitreal kenalog; VMT vitreomacular traction; MH Macular hole;  NVD neovascularization of the disc; NVE neovascularization elsewhere; AREDS age related eye disease study; ARMD age related macular  degeneration; POAG primary open angle glaucoma; EBMD epithelial/anterior basement membrane dystrophy; ACIOL anterior chamber intraocular lens; IOL intraocular lens; PCIOL posterior chamber intraocular lens; Phaco/IOL phacoemulsification with intraocular lens placement; [place] photorefractive keratectomy; LASIK laser assisted in situ keratomileusis; HTN hypertension; DM diabetes mellitus; COPD chronic obstructive pulmonary disease

## 2021-01-15 NOTE — Assessment & Plan Note (Signed)
CNVM with vascularized PED subfoveal OD, still active at 5-week interval today.  Repeat injection today and examination next 5 weeks

## 2021-01-15 NOTE — Assessment & Plan Note (Signed)

## 2021-02-19 ENCOUNTER — Other Ambulatory Visit: Payer: Self-pay

## 2021-02-19 ENCOUNTER — Encounter (INDEPENDENT_AMBULATORY_CARE_PROVIDER_SITE_OTHER): Payer: Self-pay | Admitting: Ophthalmology

## 2021-02-19 ENCOUNTER — Ambulatory Visit (INDEPENDENT_AMBULATORY_CARE_PROVIDER_SITE_OTHER): Payer: Medicare Other | Admitting: Ophthalmology

## 2021-02-19 DIAGNOSIS — H353211 Exudative age-related macular degeneration, right eye, with active choroidal neovascularization: Secondary | ICD-10-CM | POA: Diagnosis not present

## 2021-02-19 DIAGNOSIS — H353122 Nonexudative age-related macular degeneration, left eye, intermediate dry stage: Secondary | ICD-10-CM | POA: Diagnosis not present

## 2021-02-19 MED ORDER — BEVACIZUMAB 2.5 MG/0.1ML IZ SOSY
2.5000 mg | PREFILLED_SYRINGE | INTRAVITREAL | Status: AC | PRN
  Administered 2021-02-19: 2.5 mg via INTRAVITREAL

## 2021-02-19 NOTE — Assessment & Plan Note (Signed)
No signs of CNVM by OCT

## 2021-02-19 NOTE — Progress Notes (Signed)
02/19/2021     CHIEF COMPLAINT Patient presents for Macular Degeneration (Today follow-up in 5 weeks for history of wet AMD, CNVM OD.  No interval change in acuity per patient)   HISTORY OF PRESENT ILLNESS: Michelle Gonzales is a 80 y.o. female who presents to the clinic today for:   HPI     Macular Degeneration           Comments: Today follow-up in 5 weeks for history of wet AMD, CNVM OD.  No interval change in acuity per patient         Comments   No interval change in visual acuity right eye      Last edited by Hurman Horn, MD on 02/19/2021 10:19 AM.      Referring physician: [name], MD [place],  [place] [address]  HISTORICAL INFORMATION:   Selected notes from the MEDICAL RECORD NUMBER    Lab Results  Component Value Date   HGBA1C 5.7 (H) 09/21/2017     CURRENT MEDICATIONS: Current Outpatient Medications (Ophthalmic Drugs)  Medication Sig   Carboxymethylcellul-Glycerin (LUBRICATING EYE DROPS OP) Place 1 drop into both eyes daily as needed (Dry Eyes).   No current facility-administered medications for this visit. (Ophthalmic Drugs)   Current Outpatient Medications (Other)  Medication Sig   apixaban (ELIQUIS) 5 MG TABS tablet Take 1 tablet (5 mg total) by mouth 2 (two) times daily.   beta carotene w/minerals (OCUVITE) tablet Take 1 tablet by mouth daily.   cholecalciferol (VITAMIN D) 1000 units tablet Take 1,000 Units by mouth daily.    metoprolol tartrate (LOPRESSOR) 25 MG tablet Take 1 tablet (25 mg total) by mouth 2 (two) times daily.   Misc Natural Products (OSTEO BI-FLEX TRIPLE STRENGTH PO) Take 1 tablet by mouth daily.   montelukast (SINGULAIR) 10 MG tablet Take 10 mg by mouth at bedtime.    sertraline (ZOLOFT) 50 MG tablet Take 50 mg by mouth daily.   traZODone (DESYREL) 50 MG tablet Take 0.5 tablets by mouth at bedtime as needed.   varenicline (CHANTIX) 0.5 MG tablet Take 1 tablet (0.5 mg total) by mouth as  directed.   vitamin C (ASCORBIC ACID) 500 MG tablet Take 500 mg by mouth daily.    No current facility-administered medications for this visit. (Other)      REVIEW OF SYSTEMS: ROS   Negative for: Constitutional, Gastrointestinal, Neurological, Skin, Genitourinary, Musculoskeletal, HENT, Endocrine, Cardiovascular, Eyes, Respiratory, Psychiatric, Allergic/Imm, Heme/Lymph Last edited by Hurman Horn, MD on 02/19/2021  9:32 AM.       ALLERGIES Allergies  Allergen Reactions   Demerol [Meperidine] Nausea And Vomiting and Other (See Comments)    Bad headache ,  Nausea/Vomiting.   Penicillins     Has patient had a PCN reaction causing immediate rash, facial/tongue/throat swelling, SOB or lightheadedness with hypotension: YES Has patient had a PCN reaction causing severe rash involving mucus membranes or skin necrosis: NO Has patient had a PCN reaction that required hospitalization: NO Has patient had a PCN reaction occurring within the last 10 years: NO If all of the above answers are "NO", then may proceed with Cephalosporin use.    Codeine Other (See Comments)    Massive headache   Sulfa Antibiotics Itching and Rash    PAST MEDICAL HISTORY Past Medical History:  Diagnosis Date   Allergy    Pcn, Sulfa, Codeine   Anxiety    Arthritis    Atrial fibrillation ([place])  09/22/2017   Cataract    surgey b/l   COPD (chronic obstructive pulmonary disease) (HCC)    Cough    Depression    hx years ago   Hordeolum internum right lower eyelid 05/11/2020   Hot flashes    Shortness of breath    Sjogren's disease ([place])    Past Surgical History:  Procedure Laterality Date   ABDOMINAL HYSTERECTOMY  73   partial   BREAST LUMPECTOMY WITH NEEDLE LOCALIZATION AND AXILLARY SENTINEL LYMPH NODE BX Right 02/01/2013   Procedure: BREAST LUMPECTOMY WITH NEEDLE LOCALIZATION AND AXILLARY SENTINEL LYMPH NODE BX;  Surgeon: Odis Hollingshead, MD;  Location: [facility];  Service: General;   Laterality: Right;   BREAST SURGERY Right 1965   biopsy   CERVICAL FUSION  2010   CHOLECYSTECTOMY  2002   COLONOSCOPY     DILATION AND CURETTAGE OF UTERUS     LEFT HEART CATH AND CORONARY ANGIOGRAPHY N/A 09/24/2017   Procedure: LEFT HEART CATH AND CORONARY ANGIOGRAPHY;  Surgeon: Leonie Man, MD;  Location: [facility];  Service: Cardiovascular;  Laterality: N/A;   ULTRASOUND GUIDANCE FOR VASCULAR ACCESS Right 09/24/2017   Procedure: Ultrasound Guidance For Vascular Access;  Surgeon: Leonie Man, MD;  Location: [facility];  Service: Cardiovascular;  Laterality: Right;    FAMILY HISTORY Family History  Problem Relation Age of Onset   Breast cancer Mother     SOCIAL HISTORY Social History   Tobacco Use   Smoking status: Former    Packs/day: 0.50    Types: Cigarettes    Quit date: 10/07/2017    Years since quitting: 3.3   Smokeless tobacco: Never  Vaping Use   Vaping Use: Never used  Substance Use Topics   Alcohol use: Yes    Comment: rare   Drug use: No         OPHTHALMIC EXAM:  Base Eye Exam     Visual Acuity (ETDRS)       Right Left   Dist [place] 20/50 20/40   Dist ph  NI 20/30 +2         Tonometry (Tonopen, 9:30 AM)       Right Left   Pressure 13 14         Pupils       Pupils React APD   Right PERRL Brisk None   Left PERRL Brisk None         Visual Fields       Left Right    Full Full         Neuro/Psych     Oriented x3: Yes   Mood/Affect: Normal         Dilation     Right eye: 1.0% Mydriacyl, 2.5% Phenylephrine @ 9:31 AM           Slit Lamp and Fundus Exam     External Exam       Right Left   External Normal Normal         Slit Lamp Exam       Right Left   Lids/Lashes Internal hordeolum - lower lid, resolved, inspissated oral glands, meibomitis, rosacea blepharitis stable Normal   Conjunctiva/Sclera White and quiet White and quiet   Cornea Clear Clear   Anterior Chamber Deep and quiet  Deep and quiet   Iris Round and reactive Round and reactive   Lens Posterior chamber intraocular lens Posterior chamber intraocular lens   Anterior  Vitreous Normal Normal         Fundus Exam       Right Left   Posterior Vitreous Posterior vitreous detachment, Central vitreous floaters    Disc Normal    C/D Ratio 0.5    Macula Subretinal neovascular membrane improved , Retinal pigment epithelial mottling,  stable Macular thickening, RPE detachment, no retinal hemorrhage    Vessels Normal    Periphery Normal             IMAGING AND PROCEDURES  Imaging and Procedures for 02/19/21  OCT, Retina - OU - Both Eyes       Right Eye Quality was good. Scan locations included subfoveal. Central Foveal Thickness: 250. Progression has been stable. Findings include subretinal fluid, pigment epithelial detachment.   Left Eye Quality was good. Scan locations included subfoveal. Central Foveal Thickness: 239. Progression has been stable. Findings include abnormal foveal contour, no IRF, no SRF.   Notes Persistent subretinal fluid, today at 5-week follow-up OD, will repeat injection Avastin today and maintain interval examination next to 5 weeks right eye  OS no active disease       Intravitreal Injection, Pharmacologic Agent - OD - Right Eye       Time Out 02/19/2021. 10:18 AM. Confirmed correct patient, procedure, site, and patient consented.   Anesthesia Topical anesthesia was used. Anesthetic medications included Akten 3.5%.   Procedure Preparation included Tobramycin 0.3%, 10% betadine to eyelids, 5% betadine to ocular surface. A 30 gauge needle was used.   Injection: 2.5 mg bevacizumab 2.5 MG/0.1ML   Route: Intravitreal, Site: Right Eye   NDC: [phone], Lot: [id number]   Post-op Post injection exam found visual acuity of at least counting fingers. The patient tolerated the procedure well. There were no complications. The patient received written and verbal post  procedure care education. Post injection medications were not given.              ASSESSMENT/PLAN:  Exudative age-related macular degeneration of right eye with active choroidal neovascularization (HCC) History of active CNVM wet at 5-week interval.  Overall less fluid in the subretinal foveal location yet still extensive inferior to the fovea OD  Intermediate stage nonexudative age-related macular degeneration of left eye No signs of CNVM by OCT     ICD-10-CM   1. Exudative age-related macular degeneration of right eye with active choroidal neovascularization (HCC)  H35.3211 OCT, Retina - OU - Both Eyes    Intravitreal Injection, Pharmacologic Agent - OD - Right Eye    bevacizumab (AVASTIN) SOSY 2.5 mg    2. Intermediate stage nonexudative age-related macular degeneration of left eye  H35.3122       1.  OD with chronic active subretinal fluid inferior to the fovea but less in the foveal location.  Repeat injection today of intravitreal Avastin to control CNVM and vascularized PED.  2.  Dilate OD next possible injection  3.  Ophthalmic Meds Ordered this visit:  Meds ordered this encounter  Medications   bevacizumab (AVASTIN) SOSY 2.5 mg       Return in about 5 weeks (around 03/26/2021) for dilate, OD, AVASTIN OCT.  There are no Patient Instructions on file for this visit.   Explained the diagnoses, plan, and follow up with the patient and they expressed understanding.  Patient expressed understanding of the importance of proper follow up care.   Clent Demark Rubens Cranston M.D. Diseases & Surgery of the Retina and Vitreous Retina & Diabetic [place] 02/19/21  Abbreviations: M myopia (nearsighted); A astigmatism; H hyperopia (farsighted); P presbyopia; Mrx spectacle prescription;  CTL contact lenses; OD right eye; OS left eye; OU both eyes  XT exotropia; ET esotropia; PEK punctate epithelial keratitis; PEE punctate epithelial erosions; DES dry eye syndrome; MGD meibomian  gland dysfunction; ATs artificial tears; PFAT's preservative free artificial tears; [place] nuclear sclerotic cataract; PSC posterior subcapsular cataract; ERM epi-retinal membrane; PVD posterior vitreous detachment; RD retinal detachment; DM diabetes mellitus; DR diabetic retinopathy; NPDR non-proliferative diabetic retinopathy; PDR proliferative diabetic retinopathy; CSME clinically significant macular edema; DME diabetic macular edema; dbh dot blot hemorrhages; CWS cotton wool spot; POAG primary open angle glaucoma; C/D cup-to-disc ratio; HVF humphrey visual field; GVF goldmann visual field; OCT optical coherence tomography; IOP intraocular pressure; BRVO Branch retinal vein occlusion; CRVO central retinal vein occlusion; CRAO central retinal artery occlusion; BRAO branch retinal artery occlusion; RT retinal tear; SB scleral buckle; PPV pars plana vitrectomy; VH Vitreous hemorrhage; PRP panretinal laser photocoagulation; IVK intravitreal kenalog; VMT vitreomacular traction; MH Macular hole;  NVD neovascularization of the disc; NVE neovascularization elsewhere; AREDS age related eye disease study; ARMD age related macular degeneration; POAG primary open angle glaucoma; EBMD epithelial/anterior basement membrane dystrophy; ACIOL anterior chamber intraocular lens; IOL intraocular lens; PCIOL posterior chamber intraocular lens; Phaco/IOL phacoemulsification with intraocular lens placement; [place] photorefractive keratectomy; LASIK laser assisted in situ keratomileusis; HTN hypertension; DM diabetes mellitus; COPD chronic obstructive pulmonary disease

## 2021-02-19 NOTE — Assessment & Plan Note (Addendum)
History of active CNVM wet at 5-week interval.  Overall less fluid in the subretinal foveal location yet still extensive inferior to the fovea OD

## 2021-03-27 ENCOUNTER — Encounter (INDEPENDENT_AMBULATORY_CARE_PROVIDER_SITE_OTHER): Payer: Self-pay | Admitting: Ophthalmology

## 2021-03-27 ENCOUNTER — Ambulatory Visit (INDEPENDENT_AMBULATORY_CARE_PROVIDER_SITE_OTHER): Payer: Medicare Other | Admitting: Ophthalmology

## 2021-03-27 ENCOUNTER — Other Ambulatory Visit: Payer: Self-pay

## 2021-03-27 DIAGNOSIS — H353122 Nonexudative age-related macular degeneration, left eye, intermediate dry stage: Secondary | ICD-10-CM | POA: Diagnosis not present

## 2021-03-27 DIAGNOSIS — H353211 Exudative age-related macular degeneration, right eye, with active choroidal neovascularization: Secondary | ICD-10-CM

## 2021-03-27 MED ORDER — BEVACIZUMAB 2.5 MG/0.1ML IZ SOSY
2.5000 mg | PREFILLED_SYRINGE | INTRAVITREAL | Status: AC | PRN
  Administered 2021-03-27: 2.5 mg via INTRAVITREAL

## 2021-03-27 NOTE — Assessment & Plan Note (Signed)
No signs of CNVM OS today

## 2021-03-27 NOTE — Progress Notes (Signed)
03/27/2021     CHIEF COMPLAINT Patient presents for  Chief Complaint  Patient presents with   Macular Degeneration      HISTORY OF PRESENT ILLNESS: Michelle Gonzales is a 80 y.o. female who presents to the clinic today for:   HPI   Follow-up OD in 5 weeks post most recent injection intravitreal Avastin.  Stable acuity over that time. Last edited by Hurman Horn, MD on 03/27/2021  9:20 AM.      Referring physician: [name], MD [address] [place],  [place] [address]  HISTORICAL INFORMATION:   Selected notes from the MEDICAL RECORD NUMBER    Lab Results  Component Value Date   HGBA1C 5.7 (H) 09/21/2017     CURRENT MEDICATIONS: Current Outpatient Medications (Ophthalmic Drugs)  Medication Sig   Carboxymethylcellul-Glycerin (LUBRICATING EYE DROPS OP) Place 1 drop into both eyes daily as needed (Dry Eyes).   No current facility-administered medications for this visit. (Ophthalmic Drugs)   Current Outpatient Medications (Other)  Medication Sig   apixaban (ELIQUIS) 5 MG TABS tablet Take 1 tablet (5 mg total) by mouth 2 (two) times daily.   beta carotene w/minerals (OCUVITE) tablet Take 1 tablet by mouth daily.   cholecalciferol (VITAMIN D) 1000 units tablet Take 1,000 Units by mouth daily.    metoprolol tartrate (LOPRESSOR) 25 MG tablet Take 1 tablet (25 mg total) by mouth 2 (two) times daily.   Misc Natural Products (OSTEO BI-FLEX TRIPLE STRENGTH PO) Take 1 tablet by mouth daily.   montelukast (SINGULAIR) 10 MG tablet Take 10 mg by mouth at bedtime.    sertraline (ZOLOFT) 50 MG tablet Take 50 mg by mouth daily.   traZODone (DESYREL) 50 MG tablet Take 0.5 tablets by mouth at bedtime as needed.   varenicline (CHANTIX) 0.5 MG tablet Take 1 tablet (0.5 mg total) by mouth as directed.   vitamin C (ASCORBIC ACID) 500 MG tablet Take 500 mg by mouth daily.    No current facility-administered medications for this visit. (Other)      REVIEW OF  SYSTEMS:    ALLERGIES Allergies  Allergen Reactions   Demerol [Meperidine] Nausea And Vomiting and Other (See Comments)    Bad headache ,  Nausea/Vomiting.   Penicillins     Has patient had a PCN reaction causing immediate rash, facial/tongue/throat swelling, SOB or lightheadedness with hypotension: YES Has patient had a PCN reaction causing severe rash involving mucus membranes or skin necrosis: NO Has patient had a PCN reaction that required hospitalization: NO Has patient had a PCN reaction occurring within the last 10 years: NO If all of the above answers are "NO", then may proceed with Cephalosporin use.    Codeine Other (See Comments)    Massive headache   Sulfa Antibiotics Itching and Rash    PAST MEDICAL HISTORY Past Medical History:  Diagnosis Date   Allergy    Pcn, Sulfa, Codeine   Anxiety    Arthritis    Atrial fibrillation ([place]) 09/22/2017   Cataract    surgey b/l   COPD (chronic obstructive pulmonary disease) (HCC)    Cough    Depression    hx years ago   Hordeolum internum right lower eyelid 05/11/2020   Hot flashes    Shortness of breath    Sjogren's disease ([place])    Past Surgical History:  Procedure Laterality Date   ABDOMINAL HYSTERECTOMY  73   partial   BREAST LUMPECTOMY WITH NEEDLE LOCALIZATION AND AXILLARY  SENTINEL LYMPH NODE BX Right 02/01/2013   Procedure: BREAST LUMPECTOMY WITH NEEDLE LOCALIZATION AND AXILLARY SENTINEL LYMPH NODE BX;  Surgeon: Odis Hollingshead, MD;  Location: [facility];  Service: General;  Laterality: Right;   BREAST SURGERY Right 1965   biopsy   CERVICAL FUSION  2010   CHOLECYSTECTOMY  2002   COLONOSCOPY     DILATION AND CURETTAGE OF UTERUS     LEFT HEART CATH AND CORONARY ANGIOGRAPHY N/A 09/24/2017   Procedure: LEFT HEART CATH AND CORONARY ANGIOGRAPHY;  Surgeon: Leonie Man, MD;  Location: [facility];  Service: Cardiovascular;  Laterality: N/A;   ULTRASOUND GUIDANCE FOR VASCULAR ACCESS Right  09/24/2017   Procedure: Ultrasound Guidance For Vascular Access;  Surgeon: Leonie Man, MD;  Location: [facility];  Service: Cardiovascular;  Laterality: Right;    FAMILY HISTORY Family History  Problem Relation Age of Onset   Breast cancer Mother     SOCIAL HISTORY Social History   Tobacco Use   Smoking status: Former    Packs/day: 0.50    Types: Cigarettes    Quit date: 10/07/2017    Years since quitting: 3.4   Smokeless tobacco: Never  Vaping Use   Vaping Use: Never used  Substance Use Topics   Alcohol use: Yes    Comment: rare   Drug use: No         OPHTHALMIC EXAM:  Base Eye Exam     Visual Acuity (ETDRS)       Right Left   Dist [place] 20/40 +2 20/40 +2         Tonometry (Tonopen, 9:22 AM)       Right Left   Pressure 15 15         Pupils       Pupils React APD   Right PERRL Brisk None   Left PERRL Brisk None         Visual Fields       Left Right    Full Full         Extraocular Movement       Right Left    Full, Ortho Full, Ortho         Neuro/Psych     Oriented x3: Yes   Mood/Affect: Normal         Dilation     Both eyes: 1.0% Mydriacyl, 2.5% Phenylephrine @ 9:22 AM           Slit Lamp and Fundus Exam     External Exam       Right Left   External Normal Normal         Slit Lamp Exam       Right Left   Lids/Lashes Internal hordeolum - lower lid, resolved, inspissated oral glands, meibomitis, rosacea blepharitis stable Normal   Conjunctiva/Sclera White and quiet White and quiet   Cornea Clear Clear   Anterior Chamber Deep and quiet Deep and quiet   Iris Round and reactive Round and reactive   Lens Posterior chamber intraocular lens Posterior chamber intraocular lens   Anterior Vitreous Normal Normal         Fundus Exam       Right Left   Posterior Vitreous Posterior vitreous detachment, Central vitreous floaters Posterior vitreous detachment, Central vitreous floaters   Disc Normal  Normal   C/D Ratio 0.5 0.5   Macula Subretinal neovascular membrane improved , Retinal pigment epithelial mottling,  stable Macular thickening,  RPE detachment, no retinal hemorrhage Intermediate age related macular degeneration, Hard drusen, no macular thickening, no membrane   Vessels Normal Normal   Periphery Normal Normal            IMAGING AND PROCEDURES  Imaging and Procedures for 03/27/21  OCT, Retina - OU - Both Eyes       Right Eye Quality was good. Scan locations included subfoveal. Central Foveal Thickness: 278. Progression has been stable. Findings include subretinal fluid, pigment epithelial detachment.   Left Eye Quality was good. Scan locations included subfoveal. Central Foveal Thickness: 246. Progression has been stable. Findings include abnormal foveal contour, no IRF, no SRF.   Notes Persistent subretinal fluid, today at 5-week follow-up OD, will repeat injection Avastin today and maintain interval examination next to 5 weeks right eye  OS no active disease       Intravitreal Injection, Pharmacologic Agent - OD - Right Eye       Time Out 03/27/2021. 10:12 AM. Confirmed correct patient, procedure, site, and patient consented.   Anesthesia Topical anesthesia was used. Anesthetic medications included [place] 3.5%.   Procedure Preparation included Tobramycin 0.3%, 10% betadine to eyelids, 5% betadine to ocular surface. A 30 gauge needle was used.   Injection: 2.5 mg bevacizumab 2.5 MG/0.1ML   Route: Intravitreal, Site: Right Eye   NDC: [phone], Lot: [id number]   Post-op Post injection exam found visual acuity of at least counting fingers. The patient tolerated the procedure well. There were no complications. The patient received written and verbal post procedure care education. Post injection medications were not given.              ASSESSMENT/PLAN:  Intermediate stage nonexudative age-related macular degeneration of left eye No signs of CNVM  OS today  Exudative age-related macular degeneration of right eye with active choroidal neovascularization (HCC) Chronic active serous fluid over pigment epithelial detachment, stable retinal fluid however fibrotic PED post injection interval of Avastin.  Repeat injection today     ICD-10-CM   1. Exudative age-related macular degeneration of right eye with active choroidal neovascularization (HCC)  H35.3211 OCT, Retina - OU - Both Eyes    Intravitreal Injection, Pharmacologic Agent - OD - Right Eye    bevacizumab (AVASTIN) SOSY 2.5 mg    2. Intermediate stage nonexudative age-related macular degeneration of left eye  H35.3122 OCT, Retina - OU - Both Eyes      1.  OS stable, no signs of CNVM  2.  OD, chronic active fluid overlying vascularized PED  .  5-week interval stable with stable acuity repeat injection today  3.  Ophthalmic Meds Ordered this visit:  Meds ordered this encounter  Medications   bevacizumab (AVASTIN) SOSY 2.5 mg       Return in about 5 weeks (around 05/01/2021) for dilate, OD.  There are no Patient Instructions on file for this visit.   Explained the diagnoses, plan, and follow up with the patient and they expressed understanding.  Patient expressed understanding of the importance of proper follow up care.   Clent Demark Loula Marcella M.D. Diseases & Surgery of the Retina and Vitreous [facility]ina [facility] Diabetic [place] 03/27/21     Abbreviations: M myopia (nearsighted); A astigmatism; H hyperopia (farsighted); P presbyopia; Mrx spectacle prescription;  CTL contact lenses; OD right eye; OS left eye; OU both eyes  XT exotropia; ET esotropia; PEK punctate epithelial keratitis; PEE punctate epithelial erosions; DES dry eye syndrome; MGD meibomian gland dysfunction; ATs artificial tears; PFAT's preservative  free artificial tears; [place] nuclear sclerotic cataract; PSC posterior subcapsular cataract; ERM epi-retinal membrane; PVD posterior vitreous detachment; RD retinal  detachment; DM diabetes mellitus; DR diabetic retinopathy; NPDR non-proliferative diabetic retinopathy; PDR proliferative diabetic retinopathy; CSME clinically significant macular edema; DME diabetic macular edema; dbh dot blot hemorrhages; CWS cotton wool spot; POAG primary open angle glaucoma; C/D cup-to-disc ratio; HVF humphrey visual field; GVF goldmann visual field; OCT optical coherence tomography; IOP intraocular pressure; BRVO Branch retinal vein occlusion; CRVO central retinal vein occlusion; CRAO central retinal artery occlusion; BRAO branch retinal artery occlusion; RT retinal tear; SB scleral buckle; PPV pars plana vitrectomy; VH Vitreous hemorrhage; PRP panretinal laser photocoagulation; IVK intravitreal kenalog; VMT vitreomacular traction; MH Macular hole;  NVD neovascularization of the disc; NVE neovascularization elsewhere; AREDS age related eye disease study; ARMD age related macular degeneration; POAG primary open angle glaucoma; EBMD epithelial/anterior basement membrane dystrophy; ACIOL anterior chamber intraocular lens; IOL intraocular lens; PCIOL posterior chamber intraocular lens; Phaco/IOL phacoemulsification with intraocular lens placement; [place] photorefractive keratectomy; LASIK laser assisted in situ keratomileusis; HTN hypertension; DM diabetes mellitus; COPD chronic obstructive pulmonary disease

## 2021-03-27 NOTE — Assessment & Plan Note (Signed)
Chronic active serous fluid over pigment epithelial detachment, stable retinal fluid however fibrotic PED post injection interval of Avastin.  Repeat injection today

## 2021-03-30 ENCOUNTER — Other Ambulatory Visit: Payer: Self-pay

## 2021-03-30 ENCOUNTER — Encounter: Payer: Self-pay | Admitting: Cardiovascular Disease

## 2021-03-30 ENCOUNTER — Ambulatory Visit (INDEPENDENT_AMBULATORY_CARE_PROVIDER_SITE_OTHER): Payer: Medicare Other | Admitting: Cardiovascular Disease

## 2021-03-30 VITALS — BP 128/78 | HR 75 | Ht 60.0 in | Wt 141.8 lb

## 2021-03-30 DIAGNOSIS — I480 Paroxysmal atrial fibrillation: Secondary | ICD-10-CM | POA: Diagnosis not present

## 2021-03-30 MED ORDER — VARENICLINE TARTRATE 1 MG PO TABS: 1.0000 mg | ORAL_TABLET | Freq: Two times a day (BID) | ORAL | 3 refills | Status: DC

## 2021-03-30 MED ORDER — VARENICLINE TARTRATE 0.5 MG X 11 & 1 MG X 42 PO MISC: ORAL | 0 refills | Status: DC

## 2021-03-30 NOTE — Progress Notes (Signed)
Cardiology Office Note:    Date:  03/30/2021   ID:  Michelle Gonzales, DOB 03/04/1941, MRN [id number]  PCP:  Harris, William, MD  Cardiologist:  Philip [name], MD  Electrophysiologist:  None   Referring MD: Harris, William, MD   Problem list 1.  Atrial fibrillation -paroxysmal 2.  Breast cancer 3.  History of chest pain-heart catheterization in March, 2019 showed minimal CAD   Chief Complaint  Patient presents with   Atrial Fibrillation     Michelle Gonzales is a 79 y.o. female with a hx of atrial fibrillation.  She has been seen by Nina Hammond, nurse practitioner.  I met her in the hospital in March, 2019. Has started back smoking  ( her husband continued to smoke inside the house )   Had PAF on the event monitor She cannot tell when she is in A. fib versus sinus rhythm.  She denies any chest pain, shortness of breath, or passing out.  Has not been exercising .    Sept. 11, 2020:  Doing well.  No cp or dyspnea.    Walks outside on occasion.   cannot go to the gym  Cannot feel when she is in atrial fib In on Eliquis. CBC and  BMP have been stable  Sept. 13, 2021:  Michelle Gonzales is seen today for follow up of her paroxysmal  atrial fib. Is on eliquis  Has lots of mucus production related to the Diltizem  These may be allergies Will DC and try metoprolol    March 30, 2021: Michelle Gonzales is seen today for follow-up of her paroxysmal atrial fibrillation.  She remains on Eliquis. Has started smoking again .    No CP , no dyspnea Is tolerating the metoprolol better than the Diltiazem  Past Medical History:  Diagnosis Date   Allergy    Pcn, Sulfa, Codeine   Anxiety    Arthritis    Atrial fibrillation (HCC) 09/22/2017   Cataract    surgey b/l   COPD (chronic obstructive pulmonary disease) (HCC)    Cough    Depression    hx years ago   Hordeolum internum right lower eyelid 05/11/2020   Hot flashes    Shortness of breath    Sjogren's disease (HCC)     Past Surgical  History:  Procedure Laterality Date   ABDOMINAL HYSTERECTOMY  74   partial   BREAST LUMPECTOMY WITH NEEDLE LOCALIZATION AND AXILLARY SENTINEL LYMPH NODE BX Right 02/01/2013   Procedure: BREAST LUMPECTOMY WITH NEEDLE LOCALIZATION AND AXILLARY SENTINEL LYMPH NODE BX;  Surgeon: Todd J Rosenbower, MD;  Location: [facility];  Service: General;  Laterality: Right;   BREAST SURGERY Right 1965   biopsy   CERVICAL FUSION  2010   CHOLECYSTECTOMY  2002   COLONOSCOPY     DILATION AND CURETTAGE OF UTERUS     LEFT HEART CATH AND CORONARY ANGIOGRAPHY N/A 09/24/2017   Procedure: LEFT HEART CATH AND CORONARY ANGIOGRAPHY;  Surgeon: Harding, David W, MD;  Location[facility];  Service: Cardiovascular;  Laterality: N/A;   ULTRASOUND GUIDANCE FOR VASCULAR ACCESS Right 09/24/2017   Procedure: Ultrasound Guidance For Vascular Access;  Surgeon: Harding, David W, MD;  Location: [facility];  Service: Cardiovascular;  Laterality: Right;    Current Medications: Current Meds  Medication Sig   apixaban (ELIQUIS) 5 MG TABS tablet Take 1 tablet (5 mg total) by mouth 2 (two) times daily.   beta carotene w/minerals (OCUVITE) tablet Take 1   tablet by mouth daily.   Carboxymethylcellul-Glycerin (LUBRICATING EYE DROPS OP) Place 1 drop into both eyes daily as needed (Dry Eyes).   cholecalciferol (VITAMIN D) 1000 units tablet Take 1,000 Units by mouth daily.    metoprolol tartrate (LOPRESSOR) 25 MG tablet Take 1 tablet (25 mg total) by mouth 2 (two) times daily.   Misc Natural Products (OSTEO BI-FLEX TRIPLE STRENGTH PO) Take 1 tablet by mouth daily.   montelukast (SINGULAIR) 10 MG tablet Take 10 mg by mouth at bedtime.    sertraline (ZOLOFT) 50 MG tablet Take 50 mg by mouth daily.   traZODone (DESYREL) 50 MG tablet Take 0.5 tablets by mouth at bedtime as needed.   varenicline (CHANTIX CONTINUING MONTH PAK) 1 MG tablet Take 1 tablet (1 mg total) by mouth 2 (two) times daily.   varenicline (CHANTIX  STARTING MONTH PAK) 0.5 MG X 11 & 1 MG X 42 tablet Take one 0.5 mg tablet by mouth once daily for 3 days, then increase to one 0.5 mg tablet twice daily for 4 days, then increase to one 1 mg tablet twice daily.   vitamin C (ASCORBIC ACID) 500 MG tablet Take 500 mg by mouth daily.    [DISCONTINUED] varenicline (CHANTIX) 0.5 MG tablet Take 1 tablet (0.5 mg total) by mouth as directed.     Allergies:   Demerol [meperidine], Penicillins, Codeine, and Sulfa antibiotics   Social History   Socioeconomic History   Marital status: Married    Spouse name: Not on file   Number of children: Not on file   Years of education: Not on file   Highest education level: Not on file  Occupational History   Not on file  Tobacco Use   Smoking status: Former    Packs/day: 0.50    Types: Cigarettes    Quit date: 10/07/2017    Years since quitting: 3.4   Smokeless tobacco: Never  Vaping Use   Vaping Use: Never used  Substance and Sexual Activity   Alcohol use: Yes    Comment: rare   Drug use: No   Sexual activity: Yes  Other Topics Concern   Not on file  Social History Narrative   Not on file   Social Determinants of Health   Financial Resource Strain: Not on file  Food Insecurity: Not on file  Transportation Needs: Not on file  Physical Activity: Not on file  Stress: Not on file  Social Connections: Not on file     Family History: The patient's family history includes Breast cancer in her mother.  ROS:   Please see the history of present illness.     All other systems reviewed and are negative.  EKGs/Labs/Other Studies Reviewed:    The following studies were reviewed today:   EKG:    March 30, 2021: Normal sinus rhythm at 75.  No ST or T wave changes.   Recent Labs: 10/03/2020: ALT 11; BUN 10; Creatinine, Ser 0.73; Hemoglobin 13.8; Platelets 169; Potassium 4.6; Sodium 135  Recent Lipid Panel    Component Value Date/Time   CHOL 171 09/21/2017 1001   TRIG 117 09/21/2017 1001    HDL 53 09/21/2017 1001   CHOLHDL 3.2 09/21/2017 1001   VLDL 23 09/21/2017 1001   LDLCALC 95 09/21/2017 1001    Physical Exam: Blood pressure 128/78, pulse 75, height 5' (1.524 m), weight 141 lb 12.8 oz (64.3 kg), SpO2 97 %.  GEN:  Well nourished, well developed in no acute distress HEENT: Normal NECK: No  JVD; No carotid bruits LYMPHATICS: No lymphadenopathy CARDIAC: RRR , no murmurs, rubs, gallops RESPIRATORY:   few rales in bases.  ABDOMEN: Soft, non-tender, non-distended MUSCULOSKELETAL:  No edema; No deformity  SKIN: Warm and dry NEUROLOGIC:  Alert and oriented x 3    ECG: March 30, 2021: Normal sinus rhythm at 75.  No ST or T wave changes.    ASSESSMENT:    1. Paroxysmal atrial fibrillation (HCC)     PLAN:       Paroxsymal atrial fib :       I 2.  Mild coronary artery disease:        Medication Adjustments/Labs and Tests Ordered: Current medicines are reviewed at length with the patient today.  Concerns regarding medicines are outlined above.  Orders Placed This Encounter  Procedures   EKG 12-Lead    Meds ordered this encounter  Medications   varenicline (CHANTIX STARTING MONTH PAK) 0.5 MG X 11 & 1 MG X 42 tablet    Sig: Take one 0.5 mg tablet by mouth once daily for 3 days, then increase to one 0.5 mg tablet twice daily for 4 days, then increase to one 1 mg tablet twice daily.    Dispense:  53 tablet    Refill:  0   varenicline (CHANTIX CONTINUING MONTH PAK) 1 MG tablet    Sig: Take 1 tablet (1 mg total) by mouth 2 (two) times daily.    Dispense:  60 tablet    Refill:  3     Patient Instructions  Medication Instructions:  Your physician has recommended you make the following change in your medication:  1.) Chantix - as directed; then-- 2.) Chantix Continuing pack - one tablet twice a day  *If you need a refill on your cardiac medications before your next appointment, please call your pharmacy*   Lab  Work: none  Testing/Procedures: none   Follow-Up: At [facility], you and your health needs are our priority.  As part of our continuing mission to provide you with exceptional heart care, we have created designated Provider Care Teams.  These Care Teams include your primary Cardiologist (physician) and Advanced Practice Providers (APPs -  Physician Assistants and Nurse Practitioners) who all work together to provide you with the care you need, when you need it.  We recommend signing up for the patient portal called "MyChart".  Sign up information is provided on this After Visit Summary.  MyChart is used to connect with patients for Virtual Visits (Telemedicine).  Patients are able to view lab/test results, encounter notes, upcoming appointments, etc.  Non-urgent messages can be sent to your provider as well.   To learn more about what you can do with MyChart, go to [email or web address].    Your next appointment:   12 month(s)  The format for your next appointment:   In Person  Provider:   You will see one of the following Advanced Practice Providers on your designated Care Team:   Richardson Dopp, PA-C Robbie Lis, [place]   Signed, Mertie Moores, MD  03/30/2021 10:12 AM    [place]

## 2021-03-30 NOTE — Patient Instructions (Signed)
Medication Instructions:  Your physician has recommended you make the following change in your medication:  1.) Chantix - as directed; then-- 2.) Chantix Continuing pack - one tablet twice a day  *If you need a refill on your cardiac medications before your next appointment, please call your pharmacy*   Lab Work: none  Testing/Procedures: none   Follow-Up: At [facility], you and your health needs are our priority.  As part of our continuing mission to provide you with exceptional heart care, we have created designated Provider Care Teams.  These Care Teams include your primary Cardiologist (physician) and Advanced Practice Providers (APPs -  Physician Assistants and Nurse Practitioners) who all work together to provide you with the care you need, when you need it.  We recommend signing up for the patient portal called "MyChart".  Sign up information is provided on this After Visit Summary.  MyChart is used to connect with patients for Virtual Visits (Telemedicine).  Patients are able to view lab/test results, encounter notes, upcoming appointments, etc.  Non-urgent messages can be sent to your provider as well.   To learn more about what you can do with MyChart, go to [email or web address].    Your next appointment:   12 month(s)  The format for your next appointment:   In Person  Provider:   You will see one of the following Advanced Practice Providers on your designated Care Team:   Richardson Dopp, PA-C Vin Oppelo, [place]

## 2021-05-01 ENCOUNTER — Ambulatory Visit (INDEPENDENT_AMBULATORY_CARE_PROVIDER_SITE_OTHER): Payer: Medicare Other | Admitting: Ophthalmology

## 2021-05-01 ENCOUNTER — Encounter (INDEPENDENT_AMBULATORY_CARE_PROVIDER_SITE_OTHER): Payer: Self-pay | Admitting: Ophthalmology

## 2021-05-01 ENCOUNTER — Other Ambulatory Visit: Payer: Self-pay

## 2021-05-01 DIAGNOSIS — H353122 Nonexudative age-related macular degeneration, left eye, intermediate dry stage: Secondary | ICD-10-CM | POA: Diagnosis not present

## 2021-05-01 DIAGNOSIS — H353211 Exudative age-related macular degeneration, right eye, with active choroidal neovascularization: Secondary | ICD-10-CM

## 2021-05-01 DIAGNOSIS — R0683 Snoring: Secondary | ICD-10-CM | POA: Diagnosis not present

## 2021-05-01 MED ORDER — BEVACIZUMAB 2.5 MG/0.1ML IZ SOSY
2.5000 mg | PREFILLED_SYRINGE | INTRAVITREAL | Status: AC | PRN
  Administered 2021-05-01: 2.5 mg via INTRAVITREAL

## 2021-05-01 NOTE — Assessment & Plan Note (Signed)
Subretinal fluid in the subfoveal and inferior location over vascularized PED continues.  Nonetheless slightly improved at 5-week interval.  We will repeat injection today to maintain good acuity.  Review of systems for sleep apnea is borderline at best.  She does have nocturia.  Vascular 2 inquire from her husband whether or not she snores at night or naps during the day

## 2021-05-01 NOTE — Assessment & Plan Note (Signed)
No sign of CNVM OS by OCT 

## 2021-05-01 NOTE — Progress Notes (Signed)
05/01/2021     CHIEF COMPLAINT Patient presents for  Chief Complaint  Patient presents with   Retina Follow Up      HISTORY OF PRESENT ILLNESS: Michelle Gonzales is a 80 y.o. female who presents to the clinic today for:   HPI     Retina Follow Up   Patient presents with  Wet AMD.  In right eye.  This started 5 weeks ago.  Severity is mild.  Duration of 5 weeks.  Since onset it is stable.        Comments   5 week fu OD oct avastin OD. Patient states vision is stable and unchanged since last visit. Denies any new floaters or FOL.       Last edited by Laurin Coder on 05/01/2021  9:22 AM.      Referring physician: [name], MD [address],  [place] [address]  HISTORICAL INFORMATION:   Selected notes from the MEDICAL RECORD NUMBER    Lab Results  Component Value Date   HGBA1C 5.7 (H) 09/21/2017     CURRENT MEDICATIONS: Current Outpatient Medications (Ophthalmic Drugs)  Medication Sig   Carboxymethylcellul-Glycerin (LUBRICATING EYE DROPS OP) Place 1 drop into both eyes daily as needed (Dry Eyes).   No current facility-administered medications for this visit. (Ophthalmic Drugs)   Current Outpatient Medications (Other)  Medication Sig   apixaban (ELIQUIS) 5 MG TABS tablet Take 1 tablet (5 mg total) by mouth 2 (two) times daily.   beta carotene w/minerals (OCUVITE) tablet Take 1 tablet by mouth daily.   cholecalciferol (VITAMIN D) 1000 units tablet Take 1,000 Units by mouth daily.    metoprolol tartrate (LOPRESSOR) 25 MG tablet Take 1 tablet (25 mg total) by mouth 2 (two) times daily.   Misc Natural Products (OSTEO BI-FLEX TRIPLE STRENGTH PO) Take 1 tablet by mouth daily.   montelukast (SINGULAIR) 10 MG tablet Take 10 mg by mouth at bedtime.    sertraline (ZOLOFT) 50 MG tablet Take 50 mg by mouth daily.   traZODone (DESYREL) 50 MG tablet Take 0.5 tablets by mouth at bedtime as needed.   varenicline (CHANTIX CONTINUING MONTH PAK)  1 MG tablet Take 1 tablet (1 mg total) by mouth 2 (two) times daily.   varenicline (CHANTIX STARTING MONTH PAK) 0.5 MG X 11 & 1 MG X 42 tablet Take one 0.5 mg tablet by mouth once daily for 3 days, then increase to one 0.5 mg tablet twice daily for 4 days, then increase to one 1 mg tablet twice daily.   vitamin C (ASCORBIC ACID) 500 MG tablet Take 500 mg by mouth daily.    No current facility-administered medications for this visit. (Other)      REVIEW OF SYSTEMS:    ALLERGIES Allergies  Allergen Reactions   Demerol [Meperidine] Nausea And Vomiting and Other (See Comments)    Bad headache ,  Nausea/Vomiting.   Penicillins     Has patient had a PCN reaction causing immediate rash, facial/tongue/throat swelling, SOB or lightheadedness with hypotension: YES Has patient had a PCN reaction causing severe rash involving mucus membranes or skin necrosis: NO Has patient had a PCN reaction that required hospitalization: NO Has patient had a PCN reaction occurring within the last 10 years: NO If all of the above answers are "NO", then may proceed with Cephalosporin use.    Codeine Other (See Comments)    Massive headache   Sulfa Antibiotics Itching and Rash  PAST MEDICAL HISTORY Past Medical History:  Diagnosis Date   Allergy    Pcn, Sulfa, Codeine   Anxiety    Arthritis    Atrial fibrillation ([place]) 09/22/2017   Cataract    surgey b/l   COPD (chronic obstructive pulmonary disease) (HCC)    Cough    Depression    hx years ago   Hordeolum internum right lower eyelid 05/11/2020   Hot flashes    Shortness of breath    Sjogren's disease ([place])    Past Surgical History:  Procedure Laterality Date   ABDOMINAL HYSTERECTOMY  74   partial   BREAST LUMPECTOMY WITH NEEDLE LOCALIZATION AND AXILLARY SENTINEL LYMPH NODE BX Right 02/01/2013   Procedure: BREAST LUMPECTOMY WITH NEEDLE LOCALIZATION AND AXILLARY SENTINEL LYMPH NODE BX;  Surgeon: Odis Hollingshead, MD;  Location: [facility];  Service: General;  Laterality: Right;   BREAST SURGERY Right 1965   biopsy   CERVICAL FUSION  2010   CHOLECYSTECTOMY  2002   COLONOSCOPY     DILATION AND CURETTAGE OF UTERUS     LEFT HEART CATH AND CORONARY ANGIOGRAPHY N/A 09/24/2017   Procedure: LEFT HEART CATH AND CORONARY ANGIOGRAPHY;  Surgeon: Leonie Man, MD;  Location: [facility];  Service: Cardiovascular;  Laterality: N/A;   ULTRASOUND GUIDANCE FOR VASCULAR ACCESS Right 09/24/2017   Procedure: Ultrasound Guidance For Vascular Access;  Surgeon: Leonie Man, MD;  Location: [facility];  Service: Cardiovascular;  Laterality: Right;    FAMILY HISTORY Family History  Problem Relation Age of Onset   Breast cancer Mother     SOCIAL HISTORY Social History   Tobacco Use   Smoking status: Former    Packs/day: 0.50    Types: Cigarettes    Quit date: 10/07/2017    Years since quitting: 3.5   Smokeless tobacco: Never  Vaping Use   Vaping Use: Never used  Substance Use Topics   Alcohol use: Yes    Comment: rare   Drug use: No         OPHTHALMIC EXAM:  Base Eye Exam     Visual Acuity (Snellen - Linear)       Right Left   Dist [place] 20/40 -1 20/40   Dist ph [place] NI 20/30         Tonometry (Tonopen, 9:25 AM)       Right Left   Pressure 13 15         Pupils       Pupils Dark Light APD   Right PERRL 4 3 None   Left PERRL 4 3 None         Extraocular Movement       Right Left    Full Full         Neuro/Psych     Oriented x3: Yes   Mood/Affect: Normal         Dilation     Right eye: 1.0% Mydriacyl, 2.5% Phenylephrine @ 9:25 AM           Slit Lamp and Fundus Exam     External Exam       Right Left   External Normal Normal         Slit Lamp Exam       Right Left   Lids/Lashes Internal hordeolum - lower lid, resolved, inspissated oral glands, meibomitis, rosacea blepharitis stable Normal   Conjunctiva/Sclera White and quiet White and quiet  Cornea Clear Clear   Anterior Chamber Deep and quiet Deep and quiet   Iris Round and reactive Round and reactive   Lens Posterior chamber intraocular lens Posterior chamber intraocular lens   Anterior Vitreous Normal Normal         Fundus Exam       Right Left   Posterior Vitreous Posterior vitreous detachment, Central vitreous floaters    Disc Normal    C/D Ratio 0.5    Macula Subretinal neovascular membrane improved , Retinal pigment epithelial mottling,  stable Macular thickening, RPE detachment, no retinal hemorrhage    Vessels Normal    Periphery Normal             IMAGING AND PROCEDURES  Imaging and Procedures for 05/01/21  OCT, Retina - OU - Both Eyes       Right Eye Quality was good. Scan locations included subfoveal. Central Foveal Thickness: 264. Progression has been stable. Findings include subretinal fluid, pigment epithelial detachment, abnormal foveal contour.   Left Eye Quality was good. Scan locations included subfoveal. Central Foveal Thickness: 240. Progression has been stable. Findings include abnormal foveal contour, no IRF, no SRF.   Notes Persistent subretinal fluid, today at 5-week follow-up OD, will repeat injection Avastin today and maintain interval examination next to 5 weeks right eye  OS no active disease       Intravitreal Injection, Pharmacologic Agent - OD - Right Eye       Time Out 05/01/2021. 10:20 AM. Confirmed correct patient, procedure, site, and patient consented.   Anesthesia Topical anesthesia was used. Anesthetic medications included [place] 3.5%.   Procedure Preparation included Tobramycin 0.3%, 10% betadine to eyelids, 5% betadine to ocular surface. A 30 gauge needle was used.   Injection: 2.5 mg bevacizumab 2.5 MG/0.1ML   Route: Intravitreal, Site: Right Eye   NDC: [phone], Lot: [id number]   Post-op Post injection exam found visual acuity of at least counting fingers. The patient tolerated the procedure well.  There were no complications. The patient received written and verbal post procedure care education. Post injection medications included ocuflox.              ASSESSMENT/PLAN:  Exudative age-related macular degeneration of right eye with active choroidal neovascularization (HCC) Subretinal fluid in the subfoveal and inferior location over vascularized PED continues.  Nonetheless slightly improved at 5-week interval.  We will repeat injection today to maintain good acuity.  Review of systems for sleep apnea is borderline at best.  She does have nocturia.  Vascular 2 inquire from her husband whether or not she snores at night or naps during the day  Intermediate stage nonexudative age-related macular degeneration of left eye No sign of CNVM OS by OCT  Snores Snoring with review of systems also positive for sleep apnea of nocturia.  I do recommend patient have consideration of home sleep study for screening test for sleep apnea.  Untreated sleep apnea triggers macular hypoxia and hypertensive stress     ICD-10-CM   1. Exudative age-related macular degeneration of right eye with active choroidal neovascularization (HCC)  H35.3211 OCT, Retina - OU - Both Eyes    Intravitreal Injection, Pharmacologic Agent - OD - Right Eye    bevacizumab (AVASTIN) SOSY 2.5 mg    2. Intermediate stage nonexudative age-related macular degeneration of left eye  H35.3122     3. Snores  R06.83       1.  I have asked the patient to consider reaching out to PCP,  for screening test for sleep apnea and possibly a home sleep study.  Review of systems are moderately positive for sleep apnea with snoring and also nocturia.  I explained to the patient that nightly hypoxic and hypertensive damage from untreated OSA if it is undiagnosed, can stress the macular condition and wet AMD  2.  OD with chronic active subretinal fluid, will repeat injection today at 5-week interval Avastin to maintain an examination again  next in 5 weeks  3.  Ophthalmic Meds Ordered this visit:  Meds ordered this encounter  Medications   bevacizumab (AVASTIN) SOSY 2.5 mg       Return in about 5 weeks (around 06/05/2021) for dilate, OD, AVASTIN OCT.  There are no Patient Instructions on file for this visit.   Explained the diagnoses, plan, and follow up with the patient and they expressed understanding.  Patient expressed understanding of the importance of proper follow up care.   Clent Demark Gladyce Mcray M.D. Diseases & Surgery of the Retina and Vitreous Retina & Diabetic [place] 05/01/21     Abbreviations: M myopia (nearsighted); A astigmatism; H hyperopia (farsighted); P presbyopia; Mrx spectacle prescription;  CTL contact lenses; OD right eye; OS left eye; OU both eyes  XT exotropia; ET esotropia; PEK punctate epithelial keratitis; PEE punctate epithelial erosions; DES dry eye syndrome; MGD meibomian gland dysfunction; ATs artificial tears; PFAT's preservative free artificial tears; [place] nuclear sclerotic cataract; PSC posterior subcapsular cataract; ERM epi-retinal membrane; PVD posterior vitreous detachment; RD retinal detachment; DM diabetes mellitus; DR diabetic retinopathy; NPDR non-proliferative diabetic retinopathy; PDR proliferative diabetic retinopathy; CSME clinically significant macular edema; DME diabetic macular edema; dbh dot blot hemorrhages; CWS cotton wool spot; POAG primary open angle glaucoma; C/D cup-to-disc ratio; HVF humphrey visual field; GVF goldmann visual field; OCT optical coherence tomography; IOP intraocular pressure; BRVO Branch retinal vein occlusion; CRVO central retinal vein occlusion; CRAO central retinal artery occlusion; BRAO branch retinal artery occlusion; RT retinal tear; SB scleral buckle; PPV pars plana vitrectomy; VH Vitreous hemorrhage; PRP panretinal laser photocoagulation; IVK intravitreal kenalog; VMT vitreomacular traction; MH Macular hole;  NVD neovascularization of the disc; NVE  neovascularization elsewhere; AREDS age related eye disease study; ARMD age related macular degeneration; POAG primary open angle glaucoma; EBMD epithelial/anterior basement membrane dystrophy; ACIOL anterior chamber intraocular lens; IOL intraocular lens; PCIOL posterior chamber intraocular lens; Phaco/IOL phacoemulsification with intraocular lens placement; [place] photorefractive keratectomy; LASIK laser assisted in situ keratomileusis; HTN hypertension; DM diabetes mellitus; COPD chronic obstructive pulmonary disease

## 2021-05-01 NOTE — Assessment & Plan Note (Signed)
Snoring with review of systems also positive for sleep apnea of nocturia.  I do recommend patient have consideration of home sleep study for screening test for sleep apnea.  Untreated sleep apnea triggers macular hypoxia and hypertensive stress

## 2021-06-01 DIAGNOSIS — R7303 Prediabetes: Secondary | ICD-10-CM | POA: Diagnosis not present

## 2021-06-01 DIAGNOSIS — Z0000 Encounter for general adult medical examination without abnormal findings: Secondary | ICD-10-CM | POA: Diagnosis not present

## 2021-06-01 DIAGNOSIS — Z853 Personal history of malignant neoplasm of breast: Secondary | ICD-10-CM | POA: Diagnosis not present

## 2021-06-01 DIAGNOSIS — I480 Paroxysmal atrial fibrillation: Secondary | ICD-10-CM | POA: Diagnosis not present

## 2021-06-01 DIAGNOSIS — F17200 Nicotine dependence, unspecified, uncomplicated: Secondary | ICD-10-CM | POA: Diagnosis not present

## 2021-06-01 DIAGNOSIS — E559 Vitamin D deficiency, unspecified: Secondary | ICD-10-CM | POA: Diagnosis not present

## 2021-06-01 DIAGNOSIS — E7800 Pure hypercholesterolemia, unspecified: Secondary | ICD-10-CM | POA: Diagnosis not present

## 2021-06-01 DIAGNOSIS — F5101 Primary insomnia: Secondary | ICD-10-CM | POA: Diagnosis not present

## 2021-06-01 DIAGNOSIS — J4520 Mild intermittent asthma, uncomplicated: Secondary | ICD-10-CM | POA: Diagnosis not present

## 2021-06-01 DIAGNOSIS — Z7901 Long term (current) use of anticoagulants: Secondary | ICD-10-CM | POA: Diagnosis not present

## 2021-06-05 ENCOUNTER — Encounter (INDEPENDENT_AMBULATORY_CARE_PROVIDER_SITE_OTHER): Payer: Self-pay | Admitting: Ophthalmology

## 2021-06-05 ENCOUNTER — Other Ambulatory Visit: Payer: Self-pay

## 2021-06-05 ENCOUNTER — Ambulatory Visit (INDEPENDENT_AMBULATORY_CARE_PROVIDER_SITE_OTHER): Payer: Medicare Other | Admitting: Ophthalmology

## 2021-06-05 DIAGNOSIS — H35721 Serous detachment of retinal pigment epithelium, right eye: Secondary | ICD-10-CM

## 2021-06-05 DIAGNOSIS — H353211 Exudative age-related macular degeneration, right eye, with active choroidal neovascularization: Secondary | ICD-10-CM | POA: Diagnosis not present

## 2021-06-05 MED ORDER — BEVACIZUMAB 2.5 MG/0.1ML IZ SOSY
2.5000 mg | PREFILLED_SYRINGE | INTRAVITREAL | Status: AC | PRN
  Administered 2021-06-05: 2.5 mg via INTRAVITREAL

## 2021-06-05 NOTE — Assessment & Plan Note (Signed)
Stable acuity OD yet chronic active serous component of wet AMD OD patent slightly inferior to the fovea.  We will repeat injection today

## 2021-06-05 NOTE — Assessment & Plan Note (Signed)
Subfoveal likely accounts for the serous component of retinal detachment

## 2021-06-05 NOTE — Progress Notes (Signed)
06/05/2021     CHIEF COMPLAINT Patient presents for  Chief Complaint  Patient presents with   Retina Follow Up      HISTORY OF PRESENT ILLNESS: Michelle Gonzales is a 80 y.o. female who presents to the clinic today for:   HPI     Retina Follow Up   Patient presents with  Wet AMD.  In right eye.  This started 5 weeks ago.  Duration of 5 weeks.  Since onset it is stable.        Comments   5 week f/u OD with OCT and possible Avastin injection OD      Last edited by Reather Littler, COA on 06/05/2021  9:34 AM.      Referring physician: Monna Fam, MD [place],   [id number]  HISTORICAL INFORMATION:   Selected notes from the MEDICAL RECORD NUMBER    Lab Results  Component Value Date   HGBA1C 5.7 (H) 09/21/2017     CURRENT MEDICATIONS: Current Outpatient Medications (Ophthalmic Drugs)  Medication Sig   Carboxymethylcellul-Glycerin (LUBRICATING EYE DROPS OP) Place 1 drop into both eyes daily as needed (Dry Eyes).   No current facility-administered medications for this visit. (Ophthalmic Drugs)   Current Outpatient Medications (Other)  Medication Sig   apixaban (ELIQUIS) 5 MG TABS tablet Take 1 tablet (5 mg total) by mouth 2 (two) times daily.   beta carotene w/minerals (OCUVITE) tablet Take 1 tablet by mouth daily.   cholecalciferol (VITAMIN D) 1000 units tablet Take 1,000 Units by mouth daily.    metoprolol tartrate (LOPRESSOR) 25 MG tablet Take 1 tablet (25 mg total) by mouth 2 (two) times daily.   Misc Natural Products (OSTEO BI-FLEX TRIPLE STRENGTH PO) Take 1 tablet by mouth daily.   montelukast (SINGULAIR) 10 MG tablet Take 10 mg by mouth at bedtime.    sertraline (ZOLOFT) 50 MG tablet Take 50 mg by mouth daily.   traZODone (DESYREL) 50 MG tablet Take 0.5 tablets by mouth at bedtime as needed.   varenicline (CHANTIX CONTINUING MONTH PAK) 1 MG tablet Take 1 tablet (1 mg total) by mouth 2 (two) times daily.   varenicline (CHANTIX  STARTING MONTH PAK) 0.5 MG X 11 & 1 MG X 42 tablet Take one 0.5 mg tablet by mouth once daily for 3 days, then increase to one 0.5 mg tablet twice daily for 4 days, then increase to one 1 mg tablet twice daily.   vitamin C (ASCORBIC ACID) 500 MG tablet Take 500 mg by mouth daily.    No current facility-administered medications for this visit. (Other)      REVIEW OF SYSTEMS:    ALLERGIES Allergies  Allergen Reactions   Demerol [Meperidine] Nausea And Vomiting and Other (See Comments)    Bad headache ,  Nausea/Vomiting.   Penicillins     Has patient had a PCN reaction causing immediate rash, facial/tongue/throat swelling, SOB or lightheadedness with hypotension: YES Has patient had a PCN reaction causing severe rash involving mucus membranes or skin necrosis: NO Has patient had a PCN reaction that required hospitalization: NO Has patient had a PCN reaction occurring within the last 10 years: NO If all of the above answers are "NO", then may proceed with Cephalosporin use.    Codeine Other (See Comments)    Massive headache   Sulfa Antibiotics Itching and Rash    PAST MEDICAL HISTORY Past Medical History:  Diagnosis Date   Allergy    Pcn, Sulfa,  Codeine   Anxiety    Arthritis    Atrial fibrillation ([place]) 09/22/2017   Cataract    surgey b/l   COPD (chronic obstructive pulmonary disease) (HCC)    Cough    Depression    hx years ago   Hordeolum internum right lower eyelid 05/11/2020   Hot flashes    Shortness of breath    Sjogren's disease ([place])    Past Surgical History:  Procedure Laterality Date   ABDOMINAL HYSTERECTOMY  74   partial   BREAST LUMPECTOMY WITH NEEDLE LOCALIZATION AND AXILLARY SENTINEL LYMPH NODE BX Right 02/01/2013   Procedure: BREAST LUMPECTOMY WITH NEEDLE LOCALIZATION AND AXILLARY SENTINEL LYMPH NODE BX;  Surgeon: Odis Hollingshead, MD;  Location: [facility];  Service: General;  Laterality: Right;   BREAST SURGERY Right 1965   biopsy    CERVICAL FUSION  2010   CHOLECYSTECTOMY  2002   COLONOSCOPY     DILATION AND CURETTAGE OF UTERUS     LEFT HEART CATH AND CORONARY ANGIOGRAPHY N/A 09/24/2017   Procedure: LEFT HEART CATH AND CORONARY ANGIOGRAPHY;  Surgeon: Leonie Man, MD;  Location: [facility];  Service: Cardiovascular;  Laterality: N/A;   ULTRASOUND GUIDANCE FOR VASCULAR ACCESS Right 09/24/2017   Procedure: Ultrasound Guidance For Vascular Access;  Surgeon: Leonie Man, MD;  Location: [facility];  Service: Cardiovascular;  Laterality: Right;    FAMILY HISTORY Family History  Problem Relation Age of Onset   Breast cancer Mother     SOCIAL HISTORY Social History   Tobacco Use   Smoking status: Former    Packs/day: 0.50    Types: Cigarettes    Quit date: 10/07/2017    Years since quitting: 3.6   Smokeless tobacco: Never  Vaping Use   Vaping Use: Never used  Substance Use Topics   Alcohol use: Yes    Comment: rare   Drug use: No         OPHTHALMIC EXAM:  Base Eye Exam     Visual Acuity (ETDRS)       Right Left   Dist [place] 20/32 -2 20/50 +2   Dist ph [place] NI 20/25 -2         Tonometry (Tonopen, 9:41 AM)       Right Left   Pressure 9 10         Pupils       Pupils Dark Light Shape React APD   Right PERRL 3.5 3 Round Slow None   Left PERRL 3.5 3 Round Slow None         Visual Fields (Counting fingers)       Left Right    Full Full         Extraocular Movement       Right Left    Full, Ortho Full, Ortho         Neuro/Psych     Oriented x3: Yes   Mood/Affect: Normal         Dilation     Right eye: 1.0% Mydriacyl, 2.5% Phenylephrine @ 9:41 AM           Slit Lamp and Fundus Exam     External Exam       Right Left   External Normal Normal         Slit Lamp Exam       Right Left   Lids/Lashes Internal hordeolum - lower lid, resolved, inspissated oral glands, meibomitis,  rosacea blepharitis stable Normal   Conjunctiva/Sclera White  and quiet White and quiet   Cornea Clear Clear   Anterior Chamber Deep and quiet Deep and quiet   Iris Round and reactive Round and reactive   Lens Posterior chamber intraocular lens Posterior chamber intraocular lens   Anterior Vitreous Normal Normal         Fundus Exam       Right Left   Posterior Vitreous Posterior vitreous detachment, Central vitreous floaters    Disc Normal    C/D Ratio 0.4    Macula Subretinal neovascular membrane improved , Retinal pigment epithelial mottling,  stable Macular thickening, RPE detachment, no retinal hemorrhage    Vessels Normal    Periphery Normal             IMAGING AND PROCEDURES  Imaging and Procedures for 06/05/21  OCT, Retina - OU - Both Eyes       Right Eye Quality was good. Scan locations included subfoveal. Central Foveal Thickness: 247. Progression has been stable. Findings include subretinal fluid, pigment epithelial detachment, abnormal foveal contour.   Left Eye Quality was good. Scan locations included subfoveal. Central Foveal Thickness: 240. Progression has been stable. Findings include abnormal foveal contour, no IRF, no SRF, retinal drusen .   Notes Persistent subretinal fluid yet not in the FAZ but inferior, today at 5-week follow-up OD, will repeat injection Avastin today and maintain interval examination next to 5 weeks right eye  OS no active disease       Intravitreal Injection, Pharmacologic Agent - OD - Right Eye       Time Out 06/05/2021. 10:35 AM. Confirmed correct patient, procedure, site, and patient consented.   Anesthesia Topical anesthesia was used. Anesthetic medications included Lidocaine 4%.   Procedure Preparation included Tobramycin 0.3%, 10% betadine to eyelids, 5% betadine to ocular surface. A 30 gauge needle was used.   Injection: 2.5 mg bevacizumab 2.5 MG/0.1ML   Route: Intravitreal, Site: Right Eye   NDC: [phone], Lot: [id number]   Post-op Post injection exam found visual  acuity of at least counting fingers. The patient tolerated the procedure well. There were no complications. The patient received written and verbal post procedure care education. Post injection medications included ocuflox.              ASSESSMENT/PLAN:  Exudative age-related macular degeneration of right eye with active choroidal neovascularization (HCC) Stable acuity OD yet chronic active serous component of wet AMD OD patent slightly inferior to the fovea.  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 Exudative age-related macular degeneration of right eye with active choroidal neovascularization (HCC)  H35.3211 OCT, Retina - OU - Both Eyes    Intravitreal Injection, Pharmacologic Agent - OD - Right Eye    bevacizumab (AVASTIN) SOSY 2.5 mg    2. 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  OD with chronic active CNVM with serous retinal detachment stable acuity currently on 5-week interval.  History of recurrences and worsening at longer than 5-week interval.  We will repeat injection today to maintain  2.  3.  Ophthalmic Meds Ordered this visit:  Meds ordered this encounter  Medications   bevacizumab (AVASTIN) SOSY 2.5 mg       Return in about 5 weeks (around 07/10/2021) for DILATE OU, AVASTIN  OCT, OD.  There are no Patient Instructions on file for this visit.   Explained the diagnoses, plan, and follow up with the patient and they expressed understanding.  Patient expressed understanding of the importance of proper follow up care.   Clent Demark Maisee Vollman M.D. Diseases & Surgery of the Retina and Vitreous Retina & Diabetic [place] 06/05/21     Abbreviations: M myopia (nearsighted); A astigmatism; H hyperopia (farsighted); P presbyopia; Mrx spectacle prescription;  CTL contact lenses; OD right eye; OS left  eye; OU both eyes  XT exotropia; ET esotropia; PEK punctate epithelial keratitis; PEE punctate epithelial erosions; DES dry eye syndrome; MGD meibomian gland dysfunction; ATs artificial tears; PFAT's preservative free artificial tears; [place] nuclear sclerotic cataract; PSC posterior subcapsular cataract; ERM epi-retinal membrane; PVD posterior vitreous detachment; RD retinal detachment; DM diabetes mellitus; DR diabetic retinopathy; NPDR non-proliferative diabetic retinopathy; PDR proliferative diabetic retinopathy; CSME clinically significant macular edema; DME diabetic macular edema; dbh dot blot hemorrhages; CWS cotton wool spot; POAG primary open angle glaucoma; C/D cup-to-disc ratio; HVF humphrey visual field; GVF goldmann visual field; OCT optical coherence tomography; IOP intraocular pressure; BRVO Branch retinal vein occlusion; CRVO central retinal vein occlusion; CRAO central retinal artery occlusion; BRAO branch retinal artery occlusion; RT retinal tear; SB scleral buckle; PPV pars plana vitrectomy; VH Vitreous hemorrhage; PRP panretinal laser photocoagulation; IVK intravitreal kenalog; VMT vitreomacular traction; MH Macular hole;  NVD neovascularization of the disc; NVE neovascularization elsewhere; AREDS age related eye disease study; ARMD age related macular degeneration; POAG primary open angle glaucoma; EBMD epithelial/anterior basement membrane dystrophy; ACIOL anterior chamber intraocular lens; IOL intraocular lens; PCIOL posterior chamber intraocular lens; Phaco/IOL phacoemulsification with intraocular lens placement; [place] photorefractive keratectomy; LASIK laser assisted in situ keratomileusis; HTN hypertension; DM diabetes mellitus; COPD chronic obstructive pulmonary disease

## 2021-06-18 ENCOUNTER — Other Ambulatory Visit: Payer: Self-pay | Admitting: Cardiovascular Disease

## 2021-06-19 ENCOUNTER — Other Ambulatory Visit: Payer: Self-pay

## 2021-06-19 MED ORDER — METOPROLOL TARTRATE 25 MG PO TABS: 25.0000 mg | ORAL_TABLET | Freq: Two times a day (BID) | ORAL | 2 refills | Status: DC

## 2021-06-19 NOTE — Telephone Encounter (Signed)
Pt's medication was sent to pt's pharmacy as requested. Confirmation received.  °

## 2021-06-19 NOTE — Telephone Encounter (Signed)
Outpatient Medication Detail   Disp Refills Start End   metoprolol tartrate (LOPRESSOR) 25 MG tablet 180 tablet 2 06/19/2021    Sig - Route: Take 1 tablet (25 mg total) by mouth 2 (two) times daily. - Oral   Sent to pharmacy as: metoprolol tartrate (LOPRESSOR) 25 MG tablet   E-Prescribing Status: Receipt confirmed by pharmacy (06/19/2021 12:36 PM EST)    Pharmacy  [place], [place]

## 2021-07-10 ENCOUNTER — Ambulatory Visit (INDEPENDENT_AMBULATORY_CARE_PROVIDER_SITE_OTHER): Payer: Medicare Other | Admitting: Ophthalmology

## 2021-07-10 ENCOUNTER — Other Ambulatory Visit: Payer: Self-pay

## 2021-07-10 ENCOUNTER — Encounter (INDEPENDENT_AMBULATORY_CARE_PROVIDER_SITE_OTHER): Payer: Self-pay | Admitting: Ophthalmology

## 2021-07-10 DIAGNOSIS — H35721 Serous detachment of retinal pigment epithelium, right eye: Secondary | ICD-10-CM

## 2021-07-10 DIAGNOSIS — H353122 Nonexudative age-related macular degeneration, left eye, intermediate dry stage: Secondary | ICD-10-CM

## 2021-07-10 DIAGNOSIS — H353211 Exudative age-related macular degeneration, right eye, with active choroidal neovascularization: Secondary | ICD-10-CM

## 2021-07-10 MED ORDER — BEVACIZUMAB 2.5 MG/0.1ML IZ SOSY
2.5000 mg | PREFILLED_SYRINGE | INTRAVITREAL | Status: AC | PRN
Start: 2021-07-10 — End: 2021-07-10
  Administered 2021-07-10: 10:00:00 2.5 mg via INTRAVITREAL

## 2021-07-10 NOTE — Assessment & Plan Note (Signed)
Component of wet AMD 

## 2021-07-10 NOTE — Assessment & Plan Note (Signed)
No sign of CNVM OS 

## 2021-07-10 NOTE — Progress Notes (Signed)
07/10/2021     CHIEF COMPLAINT Patient presents for  Chief Complaint  Patient presents with   Retina Follow Up      HISTORY OF PRESENT ILLNESS: Michelle Gonzales is a 80 y.o. female who presents to the clinic today for:   HPI     Retina Follow Up           Diagnosis: Wet AMD   Laterality: right eye   Onset: 5 weeks ago   Duration: 5 weeks   Course: stable         Comments   5 week fu OU oct Avastin OD. Patient states vision is stable and unchanged since last visit. Denies any new floaters or FOL.       Last edited by Laurin Coder on 07/10/2021  9:20 AM.      Referring physician: [name], MD [address] [place],  [place] [id number]  HISTORICAL INFORMATION:   Selected notes from the MEDICAL RECORD NUMBER    Lab Results  Component Value Date   HGBA1C 5.7 (H) 09/21/2017     CURRENT MEDICATIONS: Current Outpatient Medications (Ophthalmic Drugs)  Medication Sig   Carboxymethylcellul-Glycerin (LUBRICATING EYE DROPS OP) Place 1 drop into both eyes daily as needed (Dry Eyes).   No current facility-administered medications for this visit. (Ophthalmic Drugs)   Current Outpatient Medications (Other)  Medication Sig   apixaban (ELIQUIS) 5 MG TABS tablet Take 1 tablet (5 mg total) by mouth 2 (two) times daily.   beta carotene w/minerals (OCUVITE) tablet Take 1 tablet by mouth daily.   cholecalciferol (VITAMIN D) 1000 units tablet Take 1,000 Units by mouth daily.    metoprolol tartrate (LOPRESSOR) 25 MG tablet Take 1 tablet (25 mg total) by mouth 2 (two) times daily.   Misc Natural Products (OSTEO BI-FLEX TRIPLE STRENGTH PO) Take 1 tablet by mouth daily.   montelukast (SINGULAIR) 10 MG tablet Take 10 mg by mouth at bedtime.    sertraline (ZOLOFT) 50 MG tablet Take 50 mg by mouth daily.   traZODone (DESYREL) 50 MG tablet Take 0.5 tablets by mouth at bedtime as needed.   varenicline (CHANTIX CONTINUING MONTH PAK) 1 MG tablet Take 1 tablet  (1 mg total) by mouth 2 (two) times daily.   varenicline (CHANTIX STARTING MONTH PAK) 0.5 MG X 11 & 1 MG X 42 tablet Take one 0.5 mg tablet by mouth once daily for 3 days, then increase to one 0.5 mg tablet twice daily for 4 days, then increase to one 1 mg tablet twice daily.   vitamin C (ASCORBIC ACID) 500 MG tablet Take 500 mg by mouth daily.    No current facility-administered medications for this visit. (Other)      REVIEW OF SYSTEMS:    ALLERGIES Allergies  Allergen Reactions   Demerol [Meperidine] Nausea And Vomiting and Other (See Comments)    Bad headache ,  Nausea/Vomiting.   Penicillins     Has patient had a PCN reaction causing immediate rash, facial/tongue/throat swelling, SOB or lightheadedness with hypotension: YES Has patient had a PCN reaction causing severe rash involving mucus membranes or skin necrosis: NO Has patient had a PCN reaction that required hospitalization: NO Has patient had a PCN reaction occurring within the last 10 years: NO If all of the above answers are "NO", then may proceed with Cephalosporin use.    Codeine Other (See Comments)    Massive headache   Sulfa Antibiotics Itching and Rash  PAST MEDICAL HISTORY Past Medical History:  Diagnosis Date   Allergy    Pcn, Sulfa, Codeine   Anxiety    Arthritis    Atrial fibrillation ([place]) 09/22/2017   Cataract    surgey b/l   COPD (chronic obstructive pulmonary disease) (HCC)    Cough    Depression    hx years ago   Hordeolum internum right lower eyelid 05/11/2020   Hot flashes    Shortness of breath    Sjogren's disease ([place])    Past Surgical History:  Procedure Laterality Date   ABDOMINAL HYSTERECTOMY  74   partial   BREAST LUMPECTOMY WITH NEEDLE LOCALIZATION AND AXILLARY SENTINEL LYMPH NODE BX Right 02/01/2013   Procedure: BREAST LUMPECTOMY WITH NEEDLE LOCALIZATION AND AXILLARY SENTINEL LYMPH NODE BX;  Surgeon: Odis Hollingshead, MD;  Location: [facility];  Service:  General;  Laterality: Right;   BREAST SURGERY Right 1965   biopsy   CERVICAL FUSION  2010   CHOLECYSTECTOMY  2002   COLONOSCOPY     DILATION AND CURETTAGE OF UTERUS     LEFT HEART CATH AND CORONARY ANGIOGRAPHY N/A 09/24/2017   Procedure: LEFT HEART CATH AND CORONARY ANGIOGRAPHY;  Surgeon: Leonie Man, MD;  Location: [facility];  Service: Cardiovascular;  Laterality: N/A;   ULTRASOUND GUIDANCE FOR VASCULAR ACCESS Right 09/24/2017   Procedure: Ultrasound Guidance For Vascular Access;  Surgeon: Leonie Man, MD;  Location: [facility];  Service: Cardiovascular;  Laterality: Right;    FAMILY HISTORY Family History  Problem Relation Age of Onset   Breast cancer Mother     SOCIAL HISTORY Social History   Tobacco Use   Smoking status: Former    Packs/day: 0.50    Types: Cigarettes    Quit date: 10/07/2017    Years since quitting: 3.7   Smokeless tobacco: Never  Vaping Use   Vaping Use: Never used  Substance Use Topics   Alcohol use: Yes    Comment: rare   Drug use: No         OPHTHALMIC EXAM:  Base Eye Exam     Visual Acuity (ETDRS)       Right Left   Dist [place] 20/40 -1 20/40   Dist ph [place] NI 20/25 -2         Tonometry (Tonopen, 9:24 AM)       Right Left   Pressure 13 15         Pupils       Pupils Dark Light Shape React APD   Right PERRL 3 3 Round Minimal None   Left PERRL 3 3 Round Minimal None         Extraocular Movement       Right Left    Full Full         Neuro/Psych     Oriented x3: Yes   Mood/Affect: Normal         Dilation     Both eyes: 1.0% Mydriacyl, 2.5% Phenylephrine @ 9:24 AM           Slit Lamp and Fundus Exam     External Exam       Right Left   External Normal Normal         Slit Lamp Exam       Right Left   Lids/Lashes Internal hordeolum - lower lid, resolved, inspissated oral glands, meibomitis, rosacea blepharitis stable Normal   Conjunctiva/Sclera White and quiet White  and  quiet   Cornea Clear Clear   Anterior Chamber Deep and quiet Deep and quiet   Iris Round and reactive Round and reactive   Lens Posterior chamber intraocular lens Posterior chamber intraocular lens   Anterior Vitreous Normal Normal         Fundus Exam       Right Left   Posterior Vitreous Posterior vitreous detachment, Central vitreous floaters Posterior vitreous detachment, Central vitreous floaters   Disc Normal Normal   C/D Ratio 0.4 0.5   Macula Subretinal neovascular membrane improved , Retinal pigment epithelial mottling,  stable Macular thickening, RPE detachment, no retinal hemorrhage Intermediate age related macular degeneration, Hard drusen, no macular thickening, no membrane   Vessels Normal Normal   Periphery Normal Normal            IMAGING AND PROCEDURES  Imaging and Procedures for 07/10/21  Intravitreal Injection, Pharmacologic Agent - OD - Right Eye       Time Out 07/10/2021. 9:48 AM. Confirmed correct patient, procedure, site, and patient consented.   Anesthesia Topical anesthesia was used. Anesthetic medications included Lidocaine 4%.   Procedure Preparation included Tobramycin 0.3%, 10% betadine to eyelids, 5% betadine to ocular surface. A 30 gauge needle was used.   Injection: 2.5 mg bevacizumab 2.5 MG/0.1ML   Route: Intravitreal, Site: Right Eye   NDC: [phone], Lot: [id number]   Post-op Post injection exam found visual acuity of at least counting fingers. The patient tolerated the procedure well. There were no complications. The patient received written and verbal post procedure care education. Post injection medications included ocuflox.      OCT, Retina - OU - Both Eyes       Right Eye Quality was good. Scan locations included subfoveal. Central Foveal Thickness: 306. Progression has been stable. Findings include subretinal fluid, pigment epithelial detachment, abnormal foveal contour.   Left Eye Quality was good. Scan locations  included subfoveal. Central Foveal Thickness: 240. Progression has been stable. Findings include abnormal foveal contour, no IRF, no SRF, retinal drusen .   Notes Persistent subretinal fluid yet not in the FAZ but inferior, with subfoveal PED in the [place] region, today at 5-week follow-up OD, will repeat injection Avastin today and maintain interval examination next to 5 weeks right eye  OS no active disease               ASSESSMENT/PLAN:  Intermediate stage nonexudative age-related macular degeneration of left eye No sign of CNVM OS  Exudative age-related macular degeneration of right eye with active choroidal neovascularization ([place]) Persistent subretinal fluid yet stable acuity at 5-week interval post Avastin.  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 Exudative age-related macular degeneration of right eye with active choroidal neovascularization (HCC)  H35.3211 Intravitreal Injection, Pharmacologic Agent - OD - Right Eye    OCT, Retina - OU - Both Eyes    bevacizumab (AVASTIN) SOSY 2.5 mg    2. Intermediate stage nonexudative age-related macular degeneration of left eye  H35.3122     3. 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  OD with persistent subretinal fluid, active CNVM, chronic so currently at 5-week interval.  Repeat intravitreal Avastin today to maintain acuity and prevent progression.  2.  Patient to apply for good days to allow for consideration  of Eylea injection in the future  3.  No sign of CNVM OS  Ophthalmic Meds Ordered this visit:  Meds ordered this encounter  Medications   bevacizumab (AVASTIN) SOSY 2.5 mg       Return in about 5 weeks (around 08/14/2021) for dilate, OD, AVASTIN OCT.  There are no Patient Instructions on file for this visit.   Explained the diagnoses, plan, and follow up  with the patient and they expressed understanding.  Patient expressed understanding of the importance of proper follow up care.   Clent Demark Estevon Fluke M.D. Diseases & Surgery of the Retina and Vitreous Retina & Diabetic [place] 07/10/21     Abbreviations: M myopia (nearsighted); A astigmatism; H hyperopia (farsighted); P presbyopia; Mrx spectacle prescription;  CTL contact lenses; OD right eye; OS left eye; OU both eyes  XT exotropia; ET esotropia; PEK punctate epithelial keratitis; PEE punctate epithelial erosions; DES dry eye syndrome; MGD meibomian gland dysfunction; ATs artificial tears; PFAT's preservative free artificial tears; [place] nuclear sclerotic cataract; PSC posterior subcapsular cataract; ERM epi-retinal membrane; PVD posterior vitreous detachment; RD retinal detachment; DM diabetes mellitus; DR diabetic retinopathy; NPDR non-proliferative diabetic retinopathy; PDR proliferative diabetic retinopathy; CSME clinically significant macular edema; DME diabetic macular edema; dbh dot blot hemorrhages; CWS cotton wool spot; POAG primary open angle glaucoma; C/D cup-to-disc ratio; HVF humphrey visual field; GVF goldmann visual field; OCT optical coherence tomography; IOP intraocular pressure; BRVO Branch retinal vein occlusion; CRVO central retinal vein occlusion; CRAO central retinal artery occlusion; BRAO branch retinal artery occlusion; RT retinal tear; SB scleral buckle; PPV pars plana vitrectomy; VH Vitreous hemorrhage; PRP panretinal laser photocoagulation; IVK intravitreal kenalog; VMT vitreomacular traction; MH Macular hole;  NVD neovascularization of the disc; NVE neovascularization elsewhere; AREDS age related eye disease study; ARMD age related macular degeneration; POAG primary open angle glaucoma; EBMD epithelial/anterior basement membrane dystrophy; ACIOL anterior chamber intraocular lens; IOL intraocular lens; PCIOL posterior chamber intraocular lens; Phaco/IOL phacoemulsification with  intraocular lens placement; [place] photorefractive keratectomy; LASIK laser assisted in situ keratomileusis; HTN hypertension; DM diabetes mellitus; COPD chronic obstructive pulmonary disease

## 2021-07-10 NOTE — Assessment & Plan Note (Signed)
Persistent subretinal fluid yet stable acuity at 5-week interval post Avastin.  Will repeat Avastin OD today, may consider change to [facility] in the future

## 2021-07-19 ENCOUNTER — Emergency Department (HOSPITAL_BASED_OUTPATIENT_CLINIC_OR_DEPARTMENT_OTHER): Payer: Medicare Other

## 2021-07-19 ENCOUNTER — Observation Stay (HOSPITAL_BASED_OUTPATIENT_CLINIC_OR_DEPARTMENT_OTHER)
Admission: EM | Admit: 2021-07-19 | Discharge: 2021-07-20 | Disposition: A | Discharge status: Home / Self Care | Payer: Medicare Other | Attending: Internal Medicine | Admitting: Internal Medicine

## 2021-07-19 ENCOUNTER — Other Ambulatory Visit: Payer: Self-pay

## 2021-07-19 ENCOUNTER — Encounter (HOSPITAL_BASED_OUTPATIENT_CLINIC_OR_DEPARTMENT_OTHER): Payer: Self-pay

## 2021-07-19 DIAGNOSIS — R197 Diarrhea, unspecified: Secondary | ICD-10-CM | POA: Diagnosis not present

## 2021-07-19 DIAGNOSIS — Z79899 Other long term (current) drug therapy: Secondary | ICD-10-CM | POA: Insufficient documentation

## 2021-07-19 DIAGNOSIS — Z853 Personal history of malignant neoplasm of breast: Secondary | ICD-10-CM | POA: Diagnosis not present

## 2021-07-19 DIAGNOSIS — Z20822 Contact with and (suspected) exposure to covid-19: Secondary | ICD-10-CM | POA: Diagnosis not present

## 2021-07-19 DIAGNOSIS — E876 Hypokalemia: Secondary | ICD-10-CM | POA: Diagnosis not present

## 2021-07-19 DIAGNOSIS — F17210 Nicotine dependence, cigarettes, uncomplicated: Secondary | ICD-10-CM | POA: Insufficient documentation

## 2021-07-19 DIAGNOSIS — R059 Cough, unspecified: Secondary | ICD-10-CM | POA: Diagnosis not present

## 2021-07-19 DIAGNOSIS — E871 Hypo-osmolality and hyponatremia: Secondary | ICD-10-CM | POA: Diagnosis not present

## 2021-07-19 DIAGNOSIS — Z7901 Long term (current) use of anticoagulants: Secondary | ICD-10-CM | POA: Insufficient documentation

## 2021-07-19 DIAGNOSIS — I480 Paroxysmal atrial fibrillation: Secondary | ICD-10-CM | POA: Diagnosis not present

## 2021-07-19 DIAGNOSIS — J09X2 Influenza due to identified novel influenza A virus with other respiratory manifestations: Secondary | ICD-10-CM | POA: Diagnosis not present

## 2021-07-19 DIAGNOSIS — G9341 Metabolic encephalopathy: Secondary | ICD-10-CM | POA: Diagnosis not present

## 2021-07-19 DIAGNOSIS — J441 Chronic obstructive pulmonary disease with (acute) exacerbation: Secondary | ICD-10-CM | POA: Diagnosis not present

## 2021-07-19 DIAGNOSIS — J101 Influenza due to other identified influenza virus with other respiratory manifestations: Secondary | ICD-10-CM

## 2021-07-19 DIAGNOSIS — R0602 Shortness of breath: Secondary | ICD-10-CM | POA: Diagnosis not present

## 2021-07-19 LAB — BRAIN NATRIURETIC PEPTIDE: B Natriuretic Peptide: 170.6 pg/mL — ABNORMAL HIGH (ref 0.0–100.0)

## 2021-07-19 LAB — CBC WITH DIFFERENTIAL/PLATELET
Abs Immature Granulocytes: 0.02 10*3/uL (ref 0.00–0.07)
Basophils Absolute: 0 10*3/uL (ref 0.0–0.1)
Basophils Relative: 0 %
Eosinophils Absolute: 0 10*3/uL (ref 0.0–0.5)
Eosinophils Relative: 0 %
HCT: 39.1 % (ref 36.0–46.0)
Hemoglobin: 13.6 g/dL (ref 12.0–15.0)
Immature Granulocytes: 0 %
Lymphocytes Relative: 17 %
Lymphs Abs: 1.1 10*3/uL (ref 0.7–4.0)
MCH: 30.2 pg (ref 26.0–34.0)
MCHC: 34.8 g/dL (ref 30.0–36.0)
MCV: 86.9 fL (ref 80.0–100.0)
Monocytes Absolute: 0.3 10*3/uL (ref 0.1–1.0)
Monocytes Relative: 4 %
Neutro Abs: 5 10*3/uL (ref 1.7–7.7)
Neutrophils Relative %: 79 %
Platelets: 170 10*3/uL (ref 150–400)
RBC: 4.5 MIL/uL (ref 3.87–5.11)
RDW: 12.3 % (ref 11.5–15.5)
WBC: 6.4 10*3/uL (ref 4.0–10.5)
nRBC: 0 % (ref 0.0–0.2)

## 2021-07-19 LAB — BASIC METABOLIC PANEL
Anion gap: 11 (ref 5–15)
BUN: 11 mg/dL (ref 8–23)
CO2: 24 mmol/L (ref 22–32)
Calcium: 8.4 mg/dL — ABNORMAL LOW (ref 8.9–10.3)
Chloride: 87 mmol/L — ABNORMAL LOW (ref 98–111)
Creatinine, Ser: 0.89 mg/dL (ref 0.44–1.00)
GFR, Estimated: >60 mL/min (ref >60)
Glucose, Bld: 191 mg/dL — ABNORMAL HIGH (ref 70–99)
Potassium: 3.2 mmol/L — ABNORMAL LOW (ref 3.5–5.1)
Sodium: 122 mmol/L — ABNORMAL LOW (ref 135–145)

## 2021-07-19 LAB — RESP PANEL BY RT-PCR (FLU A&B, COVID) ARPGX2
Influenza A by PCR: POSITIVE — AB
Influenza B by PCR: NEGATIVE
SARS Coronavirus 2 by RT PCR: NEGATIVE

## 2021-07-19 MED ORDER — SODIUM CHLORIDE 0.9 % IV SOLN
500.0000 mg | INTRAVENOUS | Status: DC
  Administered 2021-07-20: 500 mg via INTRAVENOUS
  Filled 2021-07-19: qty 5

## 2021-07-19 MED ORDER — APIXABAN 5 MG PO TABS
5.0000 mg | ORAL_TABLET | Freq: Two times a day (BID) | ORAL | Status: DC
  Administered 2021-07-20 (×2): 5 mg via ORAL
  Filled 2021-07-19 (×2): qty 1

## 2021-07-19 MED ORDER — ACETAMINOPHEN 650 MG RE SUPP: 650.0000 mg | Freq: Four times a day (QID) | RECTAL | Status: DC | PRN

## 2021-07-19 MED ORDER — BENZONATATE 100 MG PO CAPS: 200.0000 mg | ORAL_CAPSULE | Freq: Three times a day (TID) | ORAL | Status: DC | PRN

## 2021-07-19 MED ORDER — SODIUM CHLORIDE 0.9 % IV BOLUS
1000.0000 mL | Freq: Once | INTRAVENOUS | Status: AC
  Administered 2021-07-19: 19:00:00 1000 mL via INTRAVENOUS

## 2021-07-19 MED ORDER — IPRATROPIUM-ALBUTEROL 0.5-2.5 (3) MG/3ML IN SOLN
3.0000 mL | Freq: Four times a day (QID) | RESPIRATORY_TRACT | Status: DC
  Administered 2021-07-20: 02:00:00 3 mL via RESPIRATORY_TRACT
  Filled 2021-07-19: qty 3

## 2021-07-19 MED ORDER — METHYLPREDNISOLONE SODIUM SUCC 125 MG IJ SOLR
80.0000 mg | Freq: Two times a day (BID) | INTRAMUSCULAR | Status: DC
  Administered 2021-07-20: 08:00:00 80 mg via INTRAVENOUS
  Filled 2021-07-19: qty 2

## 2021-07-19 MED ORDER — METHYLPREDNISOLONE SODIUM SUCC 125 MG IJ SOLR
125.0000 mg | Freq: Once | INTRAMUSCULAR | Status: AC
  Administered 2021-07-19: 18:00:00 125 mg via INTRAVENOUS
  Filled 2021-07-19: qty 2

## 2021-07-19 MED ORDER — METOPROLOL TARTRATE 25 MG PO TABS
25.0000 mg | ORAL_TABLET | Freq: Two times a day (BID) | ORAL | Status: DC
  Administered 2021-07-20: 08:00:00 25 mg via ORAL
  Filled 2021-07-19: qty 1

## 2021-07-19 MED ORDER — ACETAMINOPHEN 325 MG PO TABS: 650.0000 mg | ORAL_TABLET | Freq: Four times a day (QID) | ORAL | Status: DC | PRN

## 2021-07-19 MED ORDER — ALBUTEROL SULFATE (2.5 MG/3ML) 0.083% IN NEBU: 2.5000 mg | INHALATION_SOLUTION | RESPIRATORY_TRACT | Status: DC | PRN

## 2021-07-19 MED ORDER — LACTATED RINGERS IV SOLN: INTRAVENOUS | Status: DC

## 2021-07-19 MED ORDER — IPRATROPIUM-ALBUTEROL 0.5-2.5 (3) MG/3ML IN SOLN
3.0000 mL | Freq: Once | RESPIRATORY_TRACT | Status: AC
  Administered 2021-07-19: 18:00:00 3 mL via RESPIRATORY_TRACT
  Filled 2021-07-19: qty 3

## 2021-07-19 MED ORDER — POTASSIUM CHLORIDE CRYS ER 20 MEQ PO TBCR
40.0000 meq | EXTENDED_RELEASE_TABLET | Freq: Once | ORAL | Status: AC
  Administered 2021-07-19: 19:00:00 40 meq via ORAL
  Filled 2021-07-19: qty 2

## 2021-07-19 NOTE — Plan of Care (Signed)
Initiated [facility] General Care Plan  

## 2021-07-19 NOTE — ED Provider Notes (Signed)
[facility] EMERGENCY DEPARTMENT Provider Note   CSN: [id number] Arrival date & time: 07/19/21  1706     History Chief Complaint  Patient presents with   Cough    Michelle Gonzales is a 80 y.o. female.  Patient presents ER chief complaint of 5 days of fever cough, body aches and episodes of confusion.  He was seen in an outpatient clinic and sent to the ER for further evaluation.  Otherwise denies any headache or chest pain.  Denies abdominal pain.  Patient has been trying to drink more water at home but she been having 3-5 episodes of diarrhea a day for the past 5 days.  Describes as nonbloody.  She thinks her diarrhea may be decreasing in frequency recently.  Denies any vomiting.      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 06/05/2021   Snores 05/01/2021   Exudative age-related macular degeneration of right eye with active choroidal neovascularization ([place]) 11/17/2019   Choroidal neovascularization of right eye 11/17/2019   Degenerative retinal drusen of both eyes 11/17/2019   Retinal layer separation 11/17/2019   Intermediate stage nonexudative age-related macular degeneration of right eye 11/17/2019   Intermediate stage nonexudative age-related macular degeneration of left eye 11/17/2019   Posterior vitreous detachment of right eye 11/17/2019   Abnormal cardiovascular stress test 09/23/2017   Paroxysmal atrial fibrillation ([place]) 09/22/2017   Chest pain 09/21/2017   COPD (chronic obstructive pulmonary disease) ([place]) 09/21/2017    Tobacco abuse 09/21/2017   Anxiety 09/21/2017   Depression 09/21/2017   Leukocytosis 09/21/2017   Allergy    Breast cancer of upper-outer quadrant of right female breast ([place]) 01/13/2013    Past Surgical History:  Procedure Laterality Date   ABDOMINAL HYSTERECTOMY  74   partial   BREAST LUMPECTOMY WITH NEEDLE LOCALIZATION AND AXILLARY SENTINEL LYMPH NODE BX Right 02/01/2013   Procedure: BREAST LUMPECTOMY WITH NEEDLE LOCALIZATION AND AXILLARY SENTINEL LYMPH NODE BX;  Surgeon: Odis Hollingshead, MD;  Location: [facility];  Service: General;  Laterality: Right;   BREAST SURGERY Right 1965   biopsy   CERVICAL FUSION  2010   CHOLECYSTECTOMY  2002   COLONOSCOPY     DILATION AND CURETTAGE OF UTERUS     LEFT HEART CATH AND CORONARY ANGIOGRAPHY N/A 09/24/2017   Procedure: LEFT HEART CATH AND CORONARY ANGIOGRAPHY;  Surgeon: Leonie Man, MD;  Location: [facility];  Service: Cardiovascular;  Laterality: N/A;   ULTRASOUND GUIDANCE FOR VASCULAR ACCESS Right 09/24/2017   Procedure: Ultrasound Guidance For Vascular Access;  Surgeon: Leonie Man, MD;  Location: [facility];  Service: Cardiovascular;  Laterality: Right;     OB History   No obstetric history on file.     Family History  Problem Relation Age of Onset   Breast cancer Mother     Social History   Tobacco Use   Smoking status: Every  Day    Packs/day: 0.50    Types: Cigarettes    Last attempt to quit: 10/07/2017    Years since quitting: 3.7   Smokeless tobacco: Never  Vaping Use   Vaping Use: Never used  Substance Use Topics   Alcohol use: Not Currently   Drug use: No    Home Medications Prior to Admission medications   Medication Sig Start Date End Date Taking? Authorizing Provider  apixaban (ELIQUIS) 5 MG TABS tablet Take 1 tablet (5 mg total) by mouth 2 (two) times daily. 11/20/20   [name], [name], MD  beta carotene w/minerals (OCUVITE) tablet Take 1 tablet by mouth daily.     [provider]  Carboxymethylcellul-Glycerin (LUBRICATING EYE DROPS OP) Place 1 drop into both eyes daily as needed (Dry Eyes).    [provider]  cholecalciferol (VITAMIN D) 1000 units tablet Take 1,000 Units by mouth daily.     [provider]  metoprolol tartrate (LOPRESSOR) 25 MG tablet Take 1 tablet (25 mg total) by mouth 2 (two) times daily. 06/19/21   [name], [name], MD  Misc Natural Products (OSTEO BI-FLEX TRIPLE STRENGTH PO) Take 1 tablet by mouth daily.    [provider]  montelukast (SINGULAIR) 10 MG tablet Take 10 mg by mouth at bedtime.  02/01/13   [provider]  sertraline (ZOLOFT) 50 MG tablet Take 50 mg by mouth daily. 02/21/20   [provider]  traZODone (DESYREL) 50 MG tablet Take 0.5 tablets by mouth at bedtime as needed. 08/17/18   [provider]  varenicline (CHANTIX CONTINUING MONTH PAK) 1 MG tablet Take 1 tablet (1 mg total) by mouth 2 (two) times daily. 03/30/21   [name], [name], MD  varenicline (CHANTIX STARTING MONTH PAK) 0.5 MG X 11 & 1 MG X 42 tablet Take one 0.5 mg tablet by mouth once daily for 3 days, then increase to one 0.5 mg tablet twice daily for 4 days, then increase to one 1 mg tablet twice daily. 03/30/21   [name], [name], MD  vitamin C (ASCORBIC ACID) 500 MG tablet Take 500 mg by mouth daily.     [provider]    Allergies    Demerol [meperidine], Penicillins, Codeine, and Sulfa antibiotics  Review of Systems   Review of Systems  Constitutional:  Positive for fever.  HENT:  Negative for ear pain.   Eyes:  Negative for pain.  Respiratory:  Positive for cough.   Cardiovascular:  Negative for chest pain.  Gastrointestinal:  Negative for abdominal pain.  Genitourinary:  Negative for flank pain.  Musculoskeletal:  Negative for back pain.  Skin:  Negative for rash.  Neurological:  Negative for headaches.   Physical Exam Updated Vital Signs BP 138/88    Pulse 81    Temp (!)  97.5 F (36.4 C) (Oral)    Resp 18    Ht 5\' 1"  (1.549 m)    Wt 60.8 kg    SpO2 96%    BMI 25.32 kg/m   Physical Exam Constitutional:      General: She is not in acute distress.    Appearance: Normal appearance.  HENT:     Head: Normocephalic.     Nose: Nose normal.  Eyes:     Extraocular Movements: Extraocular movements intact.  Cardiovascular:     Rate and Rhythm: Normal rate.  Pulmonary:     Comments: Moderate accessory muscle use.  Some tachypnea present.  Lung sounds clear otherwise. Abdominal:  General: Abdomen is flat.     Tenderness: There is no abdominal tenderness. There is no guarding or rebound.  Musculoskeletal:        General: Normal range of motion.     Cervical back: Normal range of motion.  Neurological:     General: No focal deficit present.     Mental Status: She is alert and oriented to person, place, and time. Mental status is at baseline.     Comments: Family feels she appears mildly confused, sometimes changing her answers to some questions when asked a second time.  They feel that she is not quite herself.  Otherwise patient is awake and alert and oriented to time person and place.  No focal neurodeficit noted in evaluation.    ED Results / Procedures / Treatments   Labs (all labs ordered are listed, but only abnormal results are displayed) Labs Reviewed  RESP PANEL BY RT-PCR (FLU A&B, COVID) ARPGX2 - Abnormal; Notable for the following components:      Result Value   Influenza A by PCR POSITIVE (*)    All other components within normal limits  BASIC METABOLIC PANEL - Abnormal; Notable for the following components:   Sodium 122 (*)    Potassium 3.2 (*)    Chloride 87 (*)    Glucose, Bld 191 (*)    Calcium 8.4 (*)    All other components within normal limits  BRAIN NATRIURETIC PEPTIDE - Abnormal; Notable for the following components:   B Natriuretic Peptide 170.6 (*)    All other components within normal limits  CBC WITH DIFFERENTIAL/PLATELET     EKG None  Radiology DG Chest Portable 1 View  Result Date: 07/19/2021 CLINICAL DATA:  Cough and shortness of breath. Patient was diagnosed with pneumonia at walk-in clinic. EXAM: PORTABLE CHEST 1 VIEW COMPARISON:  Two-view chest x-ray 05/28/2019 FINDINGS: Heart size is normal. Atherosclerotic changes are present at the aortic arch. Lungs are clear. No edema or effusion is present. No focal airspace disease present. Cervical spine fusion noted. Upper thoracic spinal augmentation noted. IMPRESSION: 1. No acute cardiopulmonary disease. 2. Atherosclerosis. Electronically Signed   By: San Morelle M.D.   On: 07/19/2021 17:59    Procedures Procedures   Medications Ordered in ED Medications  potassium chloride SA (KLOR-CON M) CR tablet 40 mEq (has no administration in time range)  ipratropium-albuterol (DUONEB) 0.5-2.5 (3) MG/3ML nebulizer solution 3 mL (3 mLs Nebulization Given 07/19/21 1805)  methylPREDNISolone sodium succinate (SOLU-MEDROL) 125 mg/2 mL injection 125 mg (125 mg Intravenous Given 07/19/21 1759)  sodium chloride 0.9 % bolus 1,000 mL (1,000 mLs Intravenous New Bag/Given 07/19/21 1836)    ED Course  I have reviewed the triage vital signs and the nursing notes.  Pertinent labs & imaging results that were available during my care of the patient were reviewed by me and considered in my medical decision making (see chart for details).    MDM Rules/Calculators/A&P                         Labs show normal white count normal hemoglobin.  BMP normal as well.  Sodium however is low at 122 potassium 3.2.  Patient is otherwise influenza A positive.  Patient given liter bolus of IV fluid resuscitation.  Given a trial of breathing treatment and Solu-Medrol initially as well with minimal change.  Given her confusion, O2 saturation ranging from 90 to 93% on room air as well as hyponatremia and  hypokalemia, will be brought into the hospitalist team.     Final Clinical  Impression(s) / ED Diagnoses Final diagnoses:  Influenza A  Diarrhea, unspecified type  Hyponatremia  Hypokalemia    Rx / DC Orders ED Discharge Orders     None        Luna Fuse, MD 07/19/21 1844

## 2021-07-19 NOTE — H&P (Signed)
History and Physical    PLEASE NOTE THAT DRAGON DICTATION SOFTWARE WAS USED IN THE CONSTRUCTION OF THIS NOTE.   Michelle Gonzales OZH:[id number] DOB: 1940/11/26 DOA: 07/19/2021  PCP: [name], MD  Patient coming from: home   I have personally briefly reviewed patient's old medical records in [place]  Chief Complaint: Shortness of breath  HPI: Michelle Gonzales is a 80 y.o. female with medical history significant for COPD, paroxysmal atrial fibrillation chronically anticoagulated on Eliquis, who is admitted to [facility] on 07/19/2021 by way of transfer from [facility] ED with acute COPD exacerbation in the setting of influenza infection after presenting from home to to the latter facility complaining of shortness of breath.   The patient reports 5 days progressive shortness of breath associated with new onset nonproductive cough.  She also notes associated rhinitis/rhinorrhea, subjective fever, generalized myalgias, in the absence of full body rigors or chills.  1-2 daily episodes of loose stool over the last 4 days in the absence of any melena or hematochezia.  Denies any associated abdominal discomfort or rash.  Patient denies any associated nausea/vomiting, trauma.  Decline in oral intake over the last associated over the timeframe.  Denies any recent dysuria or gross hematuria.  Denies any associated orthopnea, PND, or new onset peripheral edema. No recent chest pain, diaphoresis, palpitations, N/V, pre-syncope, or syncope.  Has not noted any significant wheezing.  Denies any recent hemoptysis, new lower extremity erythema, or calf tenderness.  No recent trauma or travel.   Controlled COPD setting of being a former smoker smoking approximately 3 years ago.  Denies any known baseline supplemental oxygen changes.  Medical history also notable for paroxysmal atrial fibrillation for which she is chronically anticoagulated on Eliquis.  Of note, there is also  report patient's family was suspicious that the patient is appearing mildly baseline mental status over the last few days.     [place] ED Course:  Vital signs in the ED were notable for the following:  - Tetramex 97.5; heart rate 74-83; blood pressure 131/62 -150/86; respiratory rate 17-21, oxygen saturation initially 89% on room air, which improved to 96% on 2 L nasal cannula.  Labs were notable for the following: BMP notable for the following: Sodium 122 which corrected to approximately 123.5 when taking into account, and it mild hyperglycemia, potassium 3.2, chloride 87, bicarbonate 24, creatinine 0.89 compared to most recent prior value of 0.73 emerged thousand 22, glucose 191.  BNP 170.  CBC notable for white blood cell count 6400, hemoglobin 13.6.  COVID-19 PCR negative will influenza A PCR positive.  Imaging and additional notable ED work-up: EKG shows sinus rhythm with heart rate 83, normal intervals, no evidence of T wave or ST changes.  Chest x-ray showed no evidence of acute cardiopulmonary process, including no evidence of infiltrate, edema, effusion, or pneumothorax.  While in the ED, the following were administered: Duo nebulizer x1, Solu-Medrol 125 mg IV x1, potassium chloride 40 mEq p.o. x1, normal saline x1 L bolus.  Subsequently the patient was admitted to med telemetry at [facility] for further evaluation management of presenting acute COPD exacerbation in setting of influenza A infection, complicated by acute hyponatremia and acute hypokalemia.     Review of Systems: As per HPI otherwise 10 point review of systems negative.   Past Medical History:  Diagnosis Date   Allergy    Pcn, Sulfa, Codeine   Anxiety    Arthritis  Atrial fibrillation ([place]) 09/22/2017   Cataract    surgey b/l   COPD (chronic obstructive pulmonary disease) (HCC)    Cough    Depression    hx years ago   Hordeolum internum right lower eyelid 05/11/2020   Hot flashes    Shortness  of breath    Sjogren's disease ([place])     Past Surgical History:  Procedure Laterality Date   ABDOMINAL HYSTERECTOMY  74   partial   BREAST LUMPECTOMY WITH NEEDLE LOCALIZATION AND AXILLARY SENTINEL LYMPH NODE BX Right 02/01/2013   Procedure: BREAST LUMPECTOMY WITH NEEDLE LOCALIZATION AND AXILLARY SENTINEL LYMPH NODE BX;  Surgeon: Odis Hollingshead, MD;  Location: [facility];  Service: General;  Laterality: Right;   BREAST SURGERY Right 1965   biopsy   CERVICAL FUSION  2010   CHOLECYSTECTOMY  2002   COLONOSCOPY     DILATION AND CURETTAGE OF UTERUS     LEFT HEART CATH AND CORONARY ANGIOGRAPHY N/A 09/24/2017   Procedure: LEFT HEART CATH AND CORONARY ANGIOGRAPHY;  Surgeon: Leonie Man, MD;  Location: [facility];  Service: Cardiovascular;  Laterality: N/A;   ULTRASOUND GUIDANCE FOR VASCULAR ACCESS Right 09/24/2017   Procedure: Ultrasound Guidance For Vascular Access;  Surgeon: Leonie Man, MD;  Location: [facility];  Service: Cardiovascular;  Laterality: Right;    Social History:  reports that she has been smoking cigarettes. She has been smoking an average of .5 packs per day. She has never used smokeless tobacco. She reports that she does not currently use alcohol. She reports that she does not use drugs.   Allergies  Allergen Reactions   Demerol [Meperidine] Nausea And Vomiting and Other (See Comments)    Bad headache ,  Nausea/Vomiting.   Penicillins     Has patient had a PCN reaction causing immediate rash, facial/tongue/throat swelling, SOB or lightheadedness with hypotension: YES Has patient had a PCN reaction causing severe rash involving mucus membranes or skin necrosis: NO Has patient had a PCN reaction that required hospitalization: NO Has patient had a PCN reaction occurring within the last 10 years: NO If all of the above answers are "NO", then may proceed with Cephalosporin use.    Codeine Other (See Comments)    Massive headache    Sulfa Antibiotics Itching and Rash    Family History  Problem Relation Age of Onset   Breast cancer Mother     Family history reviewed and not pertinent    Prior to Admission medications   Medication Sig Start Date End Date Taking? Authorizing Provider  apixaban (ELIQUIS) 5 MG TABS tablet Take 1 tablet (5 mg total) by mouth 2 (two) times daily. 11/20/20   [name], [name], MD  beta carotene w/minerals (OCUVITE) tablet Take 1 tablet by mouth daily.    [provider]  Carboxymethylcellul-Glycerin (LUBRICATING EYE DROPS OP) Place 1 drop into both eyes daily as needed (Dry Eyes).    [provider]  cholecalciferol (VITAMIN D) 1000 units tablet Take 1,000 Units by mouth daily.     [provider]  metoprolol tartrate (LOPRESSOR) 25 MG tablet Take 1 tablet (25 mg total) by mouth 2 (two) times daily. 06/19/21   [name], [name], MD  Misc Natural Products (OSTEO BI-FLEX TRIPLE STRENGTH PO) Take 1 tablet by mouth daily.    [provider]  montelukast (SINGULAIR) 10 MG tablet Take 10 mg by mouth at bedtime.  02/01/13   [provider]  sertraline (ZOLOFT) 50 MG tablet Take 50 mg by mouth daily. 02/21/20   [provider]  traZODone (DESYREL) 50 MG tablet Take 0.5 tablets by mouth at bedtime as needed. 08/17/18   [provider]  varenicline (CHANTIX CONTINUING MONTH PAK) 1 MG tablet Take 1 tablet (1 mg total) by mouth 2 (two) times daily. 03/30/21   [name], [name], MD  varenicline (CHANTIX STARTING MONTH PAK) 0.5 MG X 11 & 1 MG X 42 tablet Take one 0.5 mg tablet by mouth once daily for 3 days, then increase to one 0.5 mg tablet twice daily for 4 days, then increase to one 1 mg tablet twice daily. 03/30/21   [name], [name], MD  vitamin C (ASCORBIC ACID) 500 MG tablet Take 500 mg by mouth daily.     [provider]     Objective    Physical Exam: Vitals:   07/19/21 2000 07/19/21 2030 07/19/21 2045 07/19/21 2158  BP: 136/75  118/71 (!) 159/99 (!) 151/92  Pulse:  74  75  Resp: 18 (!) 21 (!) 36 16  Temp:    98 F (36.7 C)  TempSrc:    Oral  SpO2:  99%  99%  Weight:      Height:        General: appears to be stated age; alert, oriented; mildly increased work of breathing noted. Skin: warm, dry, no rash Head:  AT/[place] Mouth:  Oral mucosa membranes appear dry, normal dentition Neck: supple; trachea midline Heart:  RRR; did not appreciate any M/R/G Lungs: CTAB, did not appreciate any wheezes, rales, or rhonchi Abdomen: + BS; soft, ND, NT Vascular: 2+ pedal pulses b/l; 2+ radial pulses b/l Extremities: no peripheral edema, no muscle wasting Neuro: strength and sensation intact in upper and lower extremities b/l     Labs on Admission: I have personally reviewed following labs and imaging studies  CBC: Recent Labs  Lab 07/19/21 1755  WBC 6.4  NEUTROABS 5.0  HGB 13.6  HCT 39.1  MCV 86.9  PLT 517   Basic Metabolic Panel: Recent Labs  Lab 07/19/21 1755  NA 122*  K 3.2*  CL 87*  CO2 24  GLUCOSE 191*  BUN 11  CREATININE 0.89  CALCIUM 8.4*   GFR: Estimated Creatinine Clearance: 42.9 mL/min (by C-G formula based on SCr of 0.89 mg/dL). Liver Function Tests: No results for input(s): AST, ALT, ALKPHOS, BILITOT, PROT, ALBUMIN in the last 168 hours. No results for input(s): LIPASE, AMYLASE in the last 168 hours. No results for input(s): AMMONIA in the last 168 hours. Coagulation Profile: No results for input(s): INR, PROTIME in the last 168 hours. Cardiac Enzymes: No results for input(s): CKTOTAL, CKMB, CKMBINDEX, TROPONINI in the last 168 hours. BNP (last 3 results) No results for input(s): PROBNP in the last 8760 hours. HbA1C: No results for input(s): HGBA1C in the last 72 hours. CBG: No results for input(s): GLUCAP in the last 168 hours. Lipid Profile: No results for input(s): CHOL, HDL, LDLCALC, TRIG, CHOLHDL, LDLDIRECT in the last 72 hours. Thyroid Function Tests: No results for  input(s): TSH, T4TOTAL, FREET4, T3FREE, THYROIDAB in the last 72 hours. Anemia Panel: No results for input(s): VITAMINB12, FOLATE, FERRITIN, TIBC, IRON, RETICCTPCT in the last 72 hours. Urine analysis:    Component Value Date/Time   COLORURINE YELLOW 09/21/2017 1020   APPEARANCEUR CLEAR 09/21/2017 1020   LABSPEC >1.046 (H) 09/21/2017 1020   PHURINE 6.0 09/21/2017 1020   GLUCOSEU NEGATIVE 09/21/2017 1020   HGBUR  NEGATIVE 09/21/2017 1020   BILIRUBINUR NEGATIVE 09/21/2017 [place] 09/21/2017 1020   PROTEINUR NEGATIVE 09/21/2017 1020   NITRITE NEGATIVE 09/21/2017 1020   LEUKOCYTESUR NEGATIVE 09/21/2017 1020    Radiological Exams on Admission: DG Chest Portable 1 View  Result Date: 07/19/2021 CLINICAL DATA:  Cough and shortness of breath. Patient was diagnosed with pneumonia at walk-in clinic. EXAM: PORTABLE CHEST 1 VIEW COMPARISON:  Two-view chest x-ray 05/28/2019 FINDINGS: Heart size is normal. Atherosclerotic changes are present at the aortic arch. Lungs are clear. No edema or effusion is present. No focal airspace disease present. Cervical spine fusion noted. Upper thoracic spinal augmentation noted. IMPRESSION: 1. No acute cardiopulmonary disease. 2. Atherosclerosis. Electronically Signed   By: San Morelle M.D.   On: 07/19/2021 17:59     EKG: Independently reviewed, with result as described above.    Assessment/Plan    Principal Problem:   COPD with acute exacerbation (HCC) Active Problems:   Paroxysmal atrial fibrillation (HCC)   Influenza A   SOB (shortness of breath)   Acute hyponatremia   Acute metabolic encephalopathy   Hypokalemia    #) Acute COPD exacerbation: in the context of a documented history of COPD, diagnosis of acute exacerbation on the basis of 5 days of progressive shortness of breath, new onset nonproductive cough, increased work of breathing, new supplemental oxygen requirement relative to new baseline supplemental O2  requirement, as further detailed below, with presenting CXR showing no evidence of acute cardiopulmonary process, including no evidence of infiltrate, edema, effusion, or pneumothorax. Etiology of exac felt to be s of influenza A infection in setting of positive associated PCR result, as above, will noting negative COVID-19 PCR. no clinical or radiographic evidence to suggest acutely decompensated heart failure at this time. Additionally, ACS appears less likely in the absence of chest pain, with EKG showing no evidence of acute ischemic changes. Clinically, acute PE also appears to be less likely at this time, particular given reported good compliance on chronic anticoagulation via Eliquis. Will add-on procalcitonin to further eval for any underlying bacterial pna, in the setting of increased risk for falls radiographic negative in the context of presenting dehydration.  Reports that she is a former smoker.    Plan: monitor continuous pulse oxymetry. Monitor on telemetry. Solumedrol. Scheduled duonebs q6 hours. Prn albuterol inhaler. BMP in the morning. Repeat CBC in the morning. Check serum Mg and Phos levels. Will attempt additional chart review to evaluate most recent PFT results. Will start azithromycin for benefit of shortened duration of hospitalization associated with antibiotic initiation in the setting of acute COPD exacerbation. Check blood gas. Add-on procalcitonin.  Supportive measures for influenza A infection given the duration of respiratory symptoms exceed that associated benefit for initiation of Tamiflu.     #) Influenza A infection: In setting of acute respiratory symptoms, as well as new onset rhinitis/rhinorrhea, subjective fever, generalized myalgias, with the symptoms starting 5 days ago, and finding of flu A+ PCR in the ED today.  Outside of window for initiation of Tamiflu, as above.  Of note, chest x-ray shows notes of acute cardiopulmonary process.  Appears to be precipitating  factor leading to acute COPD exacerbation.  Plan: Refraining from Tamiflu, as above.  Supportive measures, including prn Tessalon Perles, NSAID spirometry, flutter valve.  Further evaluation management of ensuing acute COPD exacerbation, as above.  Prn acetaminophen for fever.       #) Acute encephalopathy: Documentation of concern from family over for confusion relative  to baseline assess over the last few days.  Suspect that this would be metabolic in nature as a consequence of presenting acute infection for of influenza A infxn, as above, as well as contribution from dehydration in setting of recent development of loose stool.  Of note, patient is hyponatremic, potentially also contributing to her acute encephalopathy, as further detailed below.  No evidence of additional underlying infectious process at this time, although will check urinalysis to further evaluate.  Acute ischemic CVA versus seizures.  Less likely at this time.  Plan: Further evaluation management fluids a infection, as above.  Check VBG, particularly evaluate for hypercapnic encephalopathy in setting of acute COPD exacerbation.  Check urinalysis, urine UDS.  Check TSH, MMA.  Hold home central acting medications, including Zoloft and trazodone.  Further evaluation management of acute hyponatremia, as above.  Gentle IV fluids.  Fall precautions.     #) Acute hypo-osmolar hyponatremia: Presenting serum sodium 122, creatinine of 123.5, as above, relative to most recent prior value 135 in March 2022. Suspect an element of hypovolumeia, with suspected contribution from dehydration in the setting of clinical evidence of such as well as report of recent decline in oral intake as well as recent development of loose stool, as above.  However,Differential also includes the possibility of a contribution from SIADH, particularly in setting of acute respiratory infection form of influenza A and resultant acute COPD exacerbation , ongoing  chest x-ray showing no evidence of acute cardiopulmonary process, and no clinical or radiographic evidence to suggest acute decompensated heart failure.    In general, will provide gentle IV fluids to attend to suspected contribution from dehydration, while further evaluating for any additional contributing factors, including SIADH, as further detailed below. Will also check TSH.  Potential secondary pharmacologic contributions include Zoloft and trazodone. Of note, no evidence of associated acute focal neurologic deficits and no report of recent trauma.    Plan: Repeat BMP now to evaluate interval trend in serum sodium given that she received a 1 L NS bolus prior to transfer.  Monitor strict I's and O's and daily weights.  check UA, random urine sodium, urine osmolality.  Check serum osmolality to confirm suspected hypoosmolar etiology.  Repeat BMP in the morning. Check TSH. Gentle IVF's in the form of LR at 50 cc/h. Check serum uric acid level, as SIADH can be associated with hypouricemia due to hyperuricuria.  Hold home Zoloft/trazodone for now.      #) hypokalemia: Presenting serum potassium 3.2, in the context of recent increase in GI losses, as above, as well as corresponding decline in oral intake.  Status post 40 mEq of IV potassium chloride administered prior to transfer.    Plan: Add on serum magnesium level.  Follow-up results of BMP ordered to be checked now.  CMP in the morning.  Monitor on telemetry.  General continuous LR, as above.       #) Acute hypoxic respiratory distress: In setting of no known baseline supplemental oxygen requirements, presenting O2 sats in the high 80s on room air, subsequent proving in the mid 90s on 2 L nasal cannula.  Recent basis of acute COPD exacerbation as a consequence of influenza A infection, as above.  No additional injury factors notified at this time, as further detailed above.  Plan: Further evaluation management of acute COPD asked  exacerbation as above, including scheduled DuoNebs, as needed albuterol, solumedrol.  ABG in the morning.  Monitor continuous pulse oximetry.  Flutter valve.  NSAIDs  Rocchi.         #) Paroxysmal atrial fibrillation: Documented history of such. In setting of CHA2DS2-VASc score of  4, there is an indication for chronic anticoagulation for thromboembolic prophylaxis. Consistent with this, patient is chronically anticoagulated on Eliquis. Home AV nodal blocking regimen: Lopressor.   Presenting EKG demonstrates sinus rhythm without evidence of acute ischemic changes, as further detailed above.    Plan: monitor strict I's & O's and daily weights. Repeat BMP/CBC in AM. Check serum mag level. Continue home AV nodal blocking regimen.  Continue home Eliquis.       DVT prophylaxis: SCD's plus home Eliquis Code Status: Full code Family Communication: none Disposition Plan: Per Rounding Team Consults called: none;  Admission status: obs; tele.    PLEASE NOTE THAT DRAGON DICTATION SOFTWARE WAS USED IN THE CONSTRUCTION OF THIS NOTE.   Garfield DO [facility] [place]   07/19/2021, 11:04 PM

## 2021-07-19 NOTE — ED Notes (Signed)
Report called to [facility]

## 2021-07-19 NOTE — ED Triage Notes (Addendum)
Per pt and grand daughter pt with flu like sx x 5 days-was seen at PCP walk in clinic PTA-was dx with PNA and advised to come to ED-pt to triage in w/c-DOE-grand daughter states that pt has been disoriented today-pt currently A/O at present

## 2021-07-20 DIAGNOSIS — E871 Hypo-osmolality and hyponatremia: Secondary | ICD-10-CM | POA: Diagnosis not present

## 2021-07-20 DIAGNOSIS — J101 Influenza due to other identified influenza virus with other respiratory manifestations: Secondary | ICD-10-CM | POA: Diagnosis not present

## 2021-07-20 DIAGNOSIS — J441 Chronic obstructive pulmonary disease with (acute) exacerbation: Secondary | ICD-10-CM | POA: Diagnosis not present

## 2021-07-20 DIAGNOSIS — G9341 Metabolic encephalopathy: Secondary | ICD-10-CM | POA: Diagnosis not present

## 2021-07-20 DIAGNOSIS — R0602 Shortness of breath: Secondary | ICD-10-CM | POA: Diagnosis not present

## 2021-07-20 DIAGNOSIS — I480 Paroxysmal atrial fibrillation: Secondary | ICD-10-CM | POA: Diagnosis not present

## 2021-07-20 DIAGNOSIS — E876 Hypokalemia: Secondary | ICD-10-CM | POA: Diagnosis not present

## 2021-07-20 LAB — CBC WITH DIFFERENTIAL/PLATELET
Abs Immature Granulocytes: 0.04 10*3/uL (ref 0.00–0.07)
Basophils Absolute: 0 10*3/uL (ref 0.0–0.1)
Basophils Relative: 0 %
Eosinophils Absolute: 0.2 10*3/uL (ref 0.0–0.5)
Eosinophils Relative: 3 %
HCT: 39.1 % (ref 36.0–46.0)
Hemoglobin: 13.4 g/dL (ref 12.0–15.0)
Immature Granulocytes: 1 %
Lymphocytes Relative: 11 %
Lymphs Abs: 0.6 10*3/uL — ABNORMAL LOW (ref 0.7–4.0)
MCH: 30.4 pg (ref 26.0–34.0)
MCHC: 34.3 g/dL (ref 30.0–36.0)
MCV: 88.7 fL (ref 80.0–100.0)
Monocytes Absolute: 0.1 10*3/uL (ref 0.1–1.0)
Monocytes Relative: 1 %
Neutro Abs: 4.7 10*3/uL (ref 1.7–7.7)
Neutrophils Relative %: 84 %
Platelets: 155 10*3/uL (ref 150–400)
RBC: 4.41 MIL/uL (ref 3.87–5.11)
RDW: 12.4 % (ref 11.5–15.5)
WBC: 5.6 10*3/uL (ref 4.0–10.5)
nRBC: 0 % (ref 0.0–0.2)

## 2021-07-20 LAB — URINALYSIS, COMPLETE (UACMP) WITH MICROSCOPIC
Bilirubin Urine: NEGATIVE
Glucose, UA: NEGATIVE mg/dL
Hgb urine dipstick: NEGATIVE
Ketones, ur: 5 mg/dL — AB
Leukocytes,Ua: NEGATIVE
Nitrite: NEGATIVE
Protein, ur: NEGATIVE mg/dL
Specific Gravity, Urine: 1.004 — ABNORMAL LOW (ref 1.005–1.030)
pH: 6 (ref 5.0–8.0)

## 2021-07-20 LAB — RAPID URINE DRUG SCREEN, HOSP PERFORMED
Amphetamines: NOT DETECTED
Barbiturates: NOT DETECTED
Benzodiazepines: NOT DETECTED
Cocaine: NOT DETECTED
Opiates: NOT DETECTED
Tetrahydrocannabinol: NOT DETECTED

## 2021-07-20 LAB — COMPREHENSIVE METABOLIC PANEL
ALT: 24 U/L (ref 0–44)
AST: 26 U/L (ref 15–41)
Albumin: 3.6 g/dL (ref 3.5–5.0)
Alkaline Phosphatase: 70 U/L (ref 38–126)
Anion gap: 10 (ref 5–15)
BUN: 8 mg/dL (ref 8–23)
CO2: 23 mmol/L (ref 22–32)
Calcium: 8.4 mg/dL — ABNORMAL LOW (ref 8.9–10.3)
Chloride: 97 mmol/L — ABNORMAL LOW (ref 98–111)
Creatinine, Ser: 0.61 mg/dL (ref 0.44–1.00)
GFR, Estimated: >60 mL/min (ref >60)
Glucose, Bld: 147 mg/dL — ABNORMAL HIGH (ref 70–99)
Potassium: 3.8 mmol/L (ref 3.5–5.1)
Sodium: 130 mmol/L — ABNORMAL LOW (ref 135–145)
Total Bilirubin: 0.8 mg/dL (ref 0.3–1.2)
Total Protein: 7.5 g/dL (ref 6.5–8.1)

## 2021-07-20 LAB — BLOOD GAS, VENOUS
Acid-base deficit: 0.6 mmol/L (ref 0.0–2.0)
Bicarbonate: 24.3 mmol/L (ref 20.0–28.0)
O2 Saturation: 39.5 %
Patient temperature: 98.6
pCO2, Ven: 43 mmHg — ABNORMAL LOW (ref 44.0–60.0)
pH, Ven: 7.37 (ref 7.250–7.430)
pO2, Ven: 24.9 mmHg — CL (ref 32.0–45.0)

## 2021-07-20 LAB — SODIUM, URINE, RANDOM: Sodium, Ur: 14 mmol/L

## 2021-07-20 LAB — CREATININE, URINE, RANDOM: Creatinine, Urine: 28.53 mg/dL

## 2021-07-20 LAB — PROCALCITONIN: Procalcitonin: <0.1 ng/mL

## 2021-07-20 LAB — TSH: TSH: 1.056 u[IU]/mL (ref 0.350–4.500)

## 2021-07-20 LAB — OSMOLALITY: Osmolality: 279 mOsm/kg (ref 275–295)

## 2021-07-20 LAB — PHOSPHORUS: Phosphorus: 3 mg/dL (ref 2.5–4.6)

## 2021-07-20 LAB — URIC ACID: Uric Acid, Serum: 3.7 mg/dL (ref 2.5–7.1)

## 2021-07-20 LAB — OSMOLALITY, URINE: Osmolality, Ur: 163 mOsm/kg — ABNORMAL LOW (ref 300–900)

## 2021-07-20 LAB — MAGNESIUM: Magnesium: 2 mg/dL (ref 1.7–2.4)

## 2021-07-20 MED ORDER — LIP MEDEX EX OINT
TOPICAL_OINTMENT | CUTANEOUS | Status: DC | PRN
  Administered 2021-07-20: 75 via TOPICAL
  Filled 2021-07-20: qty 7

## 2021-07-20 MED ORDER — PREDNISONE 10 MG PO TABS: 40.0000 mg | ORAL_TABLET | Freq: Every day | ORAL | 0 refills | Status: AC

## 2021-07-20 MED ORDER — ALBUTEROL SULFATE HFA 108 (90 BASE) MCG/ACT IN AERS: 2.0000 | INHALATION_SPRAY | Freq: Four times a day (QID) | RESPIRATORY_TRACT | 1 refills | Status: AC | PRN

## 2021-07-20 MED ORDER — AZITHROMYCIN 250 MG PO TABS: 250.0000 mg | ORAL_TABLET | Freq: Every day | ORAL | 0 refills | Status: AC

## 2021-07-20 MED ORDER — IPRATROPIUM-ALBUTEROL 0.5-2.5 (3) MG/3ML IN SOLN
3.0000 mL | Freq: Two times a day (BID) | RESPIRATORY_TRACT | Status: DC
  Administered 2021-07-20: 08:00:00 3 mL via RESPIRATORY_TRACT
  Filled 2021-07-20: qty 3

## 2021-07-20 MED ORDER — BENZONATATE 200 MG PO CAPS: 200.0000 mg | ORAL_CAPSULE | Freq: Three times a day (TID) | ORAL | 0 refills | Status: DC | PRN

## 2021-07-20 NOTE — Progress Notes (Signed)
Patient discharged home.  IV remoeved - WNL.  Reviewed AVS and meds, instructed to follow up with PCP in 1 week - patient verbalizes understanding.  No questions at this time.  Patient assisted off unit via [place] in NAD.

## 2021-07-20 NOTE — Discharge Summary (Signed)
Physician Discharge Summary  Michelle Gonzales MWU:[id number] DOB: 03-02-1941 DOA: 07/19/2021  PCP: [name], MD  Admit date: 07/19/2021 Discharge date: 07/20/2021  Admitted From: Home  Discharge disposition: Home  Recommendations for Outpatient Follow-Up:   Follow up with your primary care provider in one week.  Check CBC, BMP, magnesium in the next visit  Discharge Diagnosis:   Principal Problem:   COPD with acute exacerbation ([place]) Active Problems:   Paroxysmal atrial fibrillation ([facility])   Influenza A   SOB (shortness of breath)   Acute hyponatremia   Acute metabolic encephalopathy   Hypokalemia  Discharge Condition: Improved.  Diet recommendation: Regular.  Wound care: None.  Code status: Full.   History of Present Illness:   Michelle Gonzales is a 80 y.o. female with past medical history of COPD, paroxysmal atrial fibrillation on Eliquis presented to hospital with 5-day history of cough shortness of breath myalgia and chills with some diarrhea.  Patient stated that she had quit 3 years back.  In the ED patient had a stable vitals.  Sodium was 122 on presentation with potassium of 3.2.  Influenza A was positive.  EKG was unremarkable chest x-ray without any acute findings.  In the ED, patient was given duo nebs Solu-Medrol and 1 L of IV fluid bolus and was admitted hospital for further evaluation and treatment.   Hospital Course:   Following conditions were addressed during hospitalization as listed below,  Acute COPD exacerbation:  Has improved exam.  Patient received IV Solu-Medrol oxygen and nebulizer and will be discharged home on p.o. prednisone for next few days.  Patient will be prescribed albuterol inhaler as well.  Chest x-ray without acute findings.  We will add doxycycline empirically for possible underlying bronchitis.  Patient is out of the window for Tamiflu any further treatment due to symptoms for several days now.       Influenza A infection:  out of the window for treatment.     Acute metabolic encephalopathy:  Could be secondary to influenza.  At baseline at this time.  TSH was 1.0.  UDS was negative.  Patient did not have leukocytosis.  Patient received gentle IV fluids during hospitalization.    Acute hypo-osmolar hyponatremia: Presenting serum sodium 122.this improved to 130 prior to discharge.  Patient received some gentle IV fluids     Hypokalemia: Initial potassium of 3.2.  Has received  supplements.  Potassium today at 3.8     Acute hypoxic respiratory failure on presentation.  Pulse ox was in the high 80s on room air.  Needed supplemental oxygen initially.  Currently on room air.      Paroxysmal atrial fibrillation: CHA2DS2-VASc score of  4, continue Eliquis from home.  Disposition.  At this time, patient is stable for disposition home with outpatient PCP follow-up  Medical Consultants:   None.  Procedures:    None Subjective:   Today, patient was seen and examined at bedside.  Patient feels much better today.  Without oxygen, has ambulated to the bathroom.  Denies any wheezing or excessive cough.  Wishes to go home.  Discharge Exam:   Vitals:   07/20/21 0733 07/20/21 0741  BP: (!) 148/80   Pulse: 72   Resp: 18   Temp: 97.8 F (36.6 C)   SpO2: 97% 96%   Vitals:   07/20/21 0144 07/20/21 0516 07/20/21 0733 07/20/21 0741  BP:  (!) 144/77 (!) 148/80   Pulse:  78 72   Resp:  19 18  Temp:  97.6 F (36.4 C) 97.8 F (36.6 C)   TempSrc:  Oral Oral   SpO2: 100% 98% 97% 96%  Weight:      Height:       General: Alert awake, not in obvious distress HENT: pupils equally reacting to light,  No scleral pallor or icterus noted. Oral mucosa is moist.  Chest:  Clear breath sounds.  Diminished breath sounds bilaterally. No crackles or wheezes.  CVS: S1 &S2 heard. No murmur.  Regular rate and rhythm. Abdomen: Soft, nontender, nondistended.  Bowel sounds are heard.   Extremities: No cyanosis, clubbing or edema.   Peripheral pulses are palpable. Psych: Alert, awake and oriented, normal mood CNS:  No cranial nerve deficits.  Power equal in all extremities.   Skin: Warm and dry.  No rashes noted.  The results of significant diagnostics from this hospitalization (including imaging, microbiology, ancillary and laboratory) are listed below for reference.     Diagnostic Studies:   DG Chest Portable 1 View  Result Date: 07/19/2021 CLINICAL DATA:  Cough and shortness of breath. Patient was diagnosed with pneumonia at walk-in clinic. EXAM: PORTABLE CHEST 1 VIEW COMPARISON:  Two-view chest x-ray 05/28/2019 FINDINGS: Heart size is normal. Atherosclerotic changes are present at the aortic arch. Lungs are clear. No edema or effusion is present. No focal airspace disease present. Cervical spine fusion noted. Upper thoracic spinal augmentation noted. IMPRESSION: 1. No acute cardiopulmonary disease. 2. Atherosclerosis. Electronically Signed   By: San Morelle M.D.   On: 07/19/2021 17:59    Labs:   Basic Metabolic Panel: Recent Labs  Lab 07/19/21 1755 07/20/21 0053  NA 122* 130*  K 3.2* 3.8  CL 87* 97*  CO2 24 23  GLUCOSE 191* 147*  BUN 11 8  CREATININE 0.89 0.61  CALCIUM 8.4* 8.4*  MG  --  2.0  PHOS  --  3.0   GFR Estimated Creatinine Clearance: 47.7 mL/min (by C-G formula based on SCr of 0.61 mg/dL). Liver Function Tests: Recent Labs  Lab 07/20/21 0053  AST 26  ALT 24  ALKPHOS 70  BILITOT 0.8  PROT 7.5  ALBUMIN 3.6   No results for input(s): LIPASE, AMYLASE in the last 168 hours. No results for input(s): AMMONIA in the last 168 hours. Coagulation profile No results for input(s): INR, PROTIME in the last 168 hours.  CBC: Recent Labs  Lab 07/19/21 1755 07/20/21 0053  WBC 6.4 5.6  NEUTROABS 5.0 4.7  HGB 13.6 13.4  HCT 39.1 39.1  MCV 86.9 88.7  PLT 170 155   Cardiac Enzymes: No results for input(s): CKTOTAL, CKMB, CKMBINDEX, TROPONINI in the last 168 hours. BNP: Invalid  input(s): POCBNP CBG: No results for input(s): GLUCAP in the last 168 hours. D-Dimer No results for input(s): DDIMER in the last 72 hours. Hgb A1c No results for input(s): HGBA1C in the last 72 hours. Lipid Profile No results for input(s): CHOL, HDL, LDLCALC, TRIG, CHOLHDL, LDLDIRECT in the last 72 hours. Thyroid function studies Recent Labs    07/20/21 0053  TSH 1.056   Anemia work up No results for input(s): VITAMINB12, FOLATE, FERRITIN, TIBC, IRON, RETICCTPCT in the last 72 hours. Microbiology Recent Results (from the past 240 hour(s))  Resp Panel by RT-PCR (Flu A&B, Covid) Nasopharyngeal Swab     Status: Abnormal   Collection Time: 07/19/21  5:26 PM   Specimen: Nasopharyngeal Swab; Nasopharyngeal(NP) swabs in vial transport medium  Result Value Ref Range Status   SARS Coronavirus 2 by RT PCR  NEGATIVE NEGATIVE Final    Comment: (NOTE) SARS-CoV-2 target nucleic acids are NOT DETECTED.  The SARS-CoV-2 RNA is generally detectable in upper respiratory specimens during the acute phase of infection. The lowest concentration of SARS-CoV-2 viral copies this assay can detect is 138 copies/mL. A negative result does not preclude SARS-Cov-2 infection and should not be used as the sole basis for treatment or other patient management decisions. A negative result may occur with  improper specimen collection/handling, submission of specimen other than nasopharyngeal swab, presence of viral mutation(s) within the areas targeted by this assay, and inadequate number of viral copies(<138 copies/mL). A negative result must be combined with clinical observations, patient history, and epidemiological information. The expected result is Negative.  Fact Sheet for Patients:  EntrepreneurPulse.com.au  Fact Sheet for Healthcare Providers:  IncredibleEmployment.be  This test is no t yet approved or cleared by the [place] FDA and  has been authorized for  detection and/or diagnosis of SARS-CoV-2 by FDA under an Emergency Use Authorization (EUA). This EUA will remain  in effect (meaning this test can be used) for the duration of the COVID-19 declaration under Section 564(b)(1) of the Act, 21 U.S.C.section 360bbb-3(b)(1), unless the authorization is terminated  or revoked sooner.       Influenza A by PCR POSITIVE (A) NEGATIVE Final   Influenza B by PCR NEGATIVE NEGATIVE Final    Comment: (NOTE) The Xpert Xpress SARS-CoV-2/FLU/RSV plus assay is intended as an aid in the diagnosis of influenza from Nasopharyngeal swab specimens and should not be used as a sole basis for treatment. Nasal washings and aspirates are unacceptable for Xpert Xpress SARS-CoV-2/FLU/RSV testing.  Fact Sheet for Patients: EntrepreneurPulse.com.au  Fact Sheet for Healthcare Providers: IncredibleEmployment.be  This test is not yet approved or cleared by the [place] FDA and has been authorized for detection and/or diagnosis of SARS-CoV-2 by FDA under an Emergency Use Authorization (EUA). This EUA will remain in effect (meaning this test can be used) for the duration of the COVID-19 declaration under Section 564(b)(1) of the Act, 21 U.S.C. section 360bbb-3(b)(1), unless the authorization is terminated or revoked.  Performed at [facility], [address] [place], [place] [address]      Discharge Instructions:   Discharge Instructions     Diet - low sodium heart healthy   Complete by: As directed    Discharge instructions   Complete by: As directed    Follow up with your primary care physician one week. No smoking. Complete the course of tamiflu. Increase hydration. Avoid overexertion.   Increase activity slowly   Complete by: As directed       Allergies as of 07/20/2021       Reactions   Demerol [meperidine] Nausea And Vomiting, Other (See Comments)   Bad headache ,  Nausea/Vomiting.    Penicillins    Has patient had a PCN reaction causing immediate rash, facial/tongue/throat swelling, SOB or lightheadedness with hypotension: YES Has patient had a PCN reaction causing severe rash involving mucus membranes or skin necrosis: NO Has patient had a PCN reaction that required hospitalization: NO Has patient had a PCN reaction occurring within the last 10 years: NO If all of the above answers are "NO", then may proceed with Cephalosporin use.   Codeine Other (See Comments)   Massive headache   Sulfa Antibiotics Itching, Rash        Medication List     TAKE these medications    albuterol 108 (90 Base)  MCG/ACT inhaler Commonly known as: VENTOLIN HFA Inhale 2 puffs into the lungs every 6 (six) hours as needed for wheezing or shortness of breath.   azithromycin 250 MG tablet Commonly known as: Zithromax Z-Pak Take 1 tablet (250 mg total) by mouth daily for 3 days. Take as directed   benzonatate 200 MG capsule Commonly known as: TESSALON Take 1 capsule (200 mg total) by mouth 3 (three) times daily as needed for cough.   beta carotene w/minerals tablet Take 1 tablet by mouth daily.   cholecalciferol 1000 units tablet Commonly known as: VITAMIN D Take 1,000 Units by mouth daily.   Eliquis 5 MG Tabs tablet Generic drug: apixaban Take 1 tablet (5 mg total) by mouth 2 (two) times daily.   LUBRICATING EYE DROPS OP Place 1 drop into both eyes daily as needed (Dry Eyes).   metoprolol tartrate 25 MG tablet Commonly known as: LOPRESSOR Take 1 tablet (25 mg total) by mouth 2 (two) times daily.   montelukast 10 MG tablet Commonly known as: SINGULAIR Take 10 mg by mouth at bedtime.   OSTEO BI-FLEX TRIPLE STRENGTH PO Take 1 tablet by mouth daily.   predniSONE 10 MG tablet Commonly known as: DELTASONE Take 4 tablets (40 mg total) by mouth daily with breakfast for 3 days.   sertraline 50 MG tablet Commonly known as: ZOLOFT Take 50 mg by mouth daily.   traZODone 50  MG tablet Commonly known as: DESYREL Take 0.5 tablets by mouth at bedtime as needed.   varenicline 0.5 MG X 11 & 1 MG X 42 tablet Commonly known as: Chantix Starting Month Pak Take one 0.5 mg tablet by mouth once daily for 3 days, then increase to one 0.5 mg tablet twice daily for 4 days, then increase to one 1 mg tablet twice daily.   varenicline 1 MG tablet Commonly known as: Chantix Continuing Month Pak Take 1 tablet (1 mg total) by mouth 2 (two) times daily.   vitamin C 500 MG tablet Commonly known as: ASCORBIC ACID Take 500 mg by mouth daily.        Follow-up Information     [name], MD Follow up in 1 week(s).   Specialty: Family Medicine Contact information: [place] [address] [phone]         [name], [name], MD .   Specialty: Cardiology Contact information: [place] [address] [phone]                 Time coordinating discharge: 39 minutes  Signed:  Angelyn Osterberg  [facility] 07/20/2021, 9:56 AM

## 2021-07-24 ENCOUNTER — Emergency Department (HOSPITAL_COMMUNITY): Payer: Medicare PPO

## 2021-07-24 ENCOUNTER — Other Ambulatory Visit: Payer: Self-pay

## 2021-07-24 ENCOUNTER — Encounter (HOSPITAL_COMMUNITY): Payer: Self-pay | Admitting: *Deleted

## 2021-07-24 ENCOUNTER — Inpatient Hospital Stay (HOSPITAL_COMMUNITY)
Admission: EM | Admit: 2021-07-24 | Discharge: 2021-07-26 | DRG: 641 | Disposition: A | Discharge status: Home / Self Care | Payer: Medicare PPO | Attending: Internal Medicine | Admitting: Internal Medicine

## 2021-07-24 DIAGNOSIS — Z885 Allergy status to narcotic agent status: Secondary | ICD-10-CM

## 2021-07-24 DIAGNOSIS — J449 Chronic obstructive pulmonary disease, unspecified: Secondary | ICD-10-CM | POA: Diagnosis not present

## 2021-07-24 DIAGNOSIS — R3915 Urgency of urination: Secondary | ICD-10-CM | POA: Diagnosis present

## 2021-07-24 DIAGNOSIS — R319 Hematuria, unspecified: Secondary | ICD-10-CM | POA: Diagnosis present

## 2021-07-24 DIAGNOSIS — E861 Hypovolemia: Secondary | ICD-10-CM | POA: Diagnosis present

## 2021-07-24 DIAGNOSIS — I4892 Unspecified atrial flutter: Secondary | ICD-10-CM | POA: Diagnosis present

## 2021-07-24 DIAGNOSIS — N390 Urinary tract infection, site not specified: Secondary | ICD-10-CM | POA: Diagnosis not present

## 2021-07-24 DIAGNOSIS — A084 Viral intestinal infection, unspecified: Secondary | ICD-10-CM | POA: Diagnosis present

## 2021-07-24 DIAGNOSIS — I480 Paroxysmal atrial fibrillation: Secondary | ICD-10-CM | POA: Diagnosis present

## 2021-07-24 DIAGNOSIS — Z79899 Other long term (current) drug therapy: Secondary | ICD-10-CM

## 2021-07-24 DIAGNOSIS — Z803 Family history of malignant neoplasm of breast: Secondary | ICD-10-CM

## 2021-07-24 DIAGNOSIS — Z880 Allergy status to penicillin: Secondary | ICD-10-CM

## 2021-07-24 DIAGNOSIS — R339 Retention of urine, unspecified: Secondary | ICD-10-CM | POA: Diagnosis present

## 2021-07-24 DIAGNOSIS — E876 Hypokalemia: Secondary | ICD-10-CM | POA: Diagnosis present

## 2021-07-24 DIAGNOSIS — E871 Hypo-osmolality and hyponatremia: Secondary | ICD-10-CM | POA: Diagnosis not present

## 2021-07-24 DIAGNOSIS — Z20822 Contact with and (suspected) exposure to covid-19: Secondary | ICD-10-CM | POA: Diagnosis present

## 2021-07-24 DIAGNOSIS — K269 Duodenal ulcer, unspecified as acute or chronic, without hemorrhage or perforation: Secondary | ICD-10-CM | POA: Diagnosis present

## 2021-07-24 DIAGNOSIS — R1030 Lower abdominal pain, unspecified: Secondary | ICD-10-CM

## 2021-07-24 DIAGNOSIS — Z853 Personal history of malignant neoplasm of breast: Secondary | ICD-10-CM

## 2021-07-24 DIAGNOSIS — E860 Dehydration: Secondary | ICD-10-CM | POA: Diagnosis present

## 2021-07-24 DIAGNOSIS — Z882 Allergy status to sulfonamides status: Secondary | ICD-10-CM

## 2021-07-24 DIAGNOSIS — F17210 Nicotine dependence, cigarettes, uncomplicated: Secondary | ICD-10-CM | POA: Diagnosis present

## 2021-07-24 DIAGNOSIS — Z7901 Long term (current) use of anticoagulants: Secondary | ICD-10-CM

## 2021-07-24 LAB — CBC WITH DIFFERENTIAL/PLATELET
Abs Immature Granulocytes: 0.35 10*3/uL — ABNORMAL HIGH (ref 0.00–0.07)
Basophils Absolute: 0.1 10*3/uL (ref 0.0–0.1)
Basophils Relative: 1 %
Eosinophils Absolute: 0 10*3/uL (ref 0.0–0.5)
Eosinophils Relative: 0 %
HCT: 43.6 % (ref 36.0–46.0)
Hemoglobin: 15.1 g/dL — ABNORMAL HIGH (ref 12.0–15.0)
Immature Granulocytes: 3 %
Lymphocytes Relative: 16 %
Lymphs Abs: 2.1 10*3/uL (ref 0.7–4.0)
MCH: 29.8 pg (ref 26.0–34.0)
MCHC: 34.6 g/dL (ref 30.0–36.0)
MCV: 86.2 fL (ref 80.0–100.0)
Monocytes Absolute: 1.3 10*3/uL — ABNORMAL HIGH (ref 0.1–1.0)
Monocytes Relative: 10 %
Neutro Abs: 9.4 10*3/uL — ABNORMAL HIGH (ref 1.7–7.7)
Neutrophils Relative %: 70 %
Platelets: 205 10*3/uL (ref 150–400)
RBC: 5.06 MIL/uL (ref 3.87–5.11)
RDW: 12.1 % (ref 11.5–15.5)
WBC: 13.2 10*3/uL — ABNORMAL HIGH (ref 4.0–10.5)
nRBC: 0 % (ref 0.0–0.2)

## 2021-07-24 LAB — COMPREHENSIVE METABOLIC PANEL
ALT: 24 U/L (ref 0–44)
AST: 21 U/L (ref 15–41)
Albumin: 3.9 g/dL (ref 3.5–5.0)
Alkaline Phosphatase: 69 U/L (ref 38–126)
Anion gap: 10 (ref 5–15)
BUN: 13 mg/dL (ref 8–23)
CO2: 24 mmol/L (ref 22–32)
Calcium: 8.4 mg/dL — ABNORMAL LOW (ref 8.9–10.3)
Chloride: 86 mmol/L — ABNORMAL LOW (ref 98–111)
Creatinine, Ser: 0.8 mg/dL (ref 0.44–1.00)
GFR, Estimated: >60 mL/min (ref >60)
Glucose, Bld: 121 mg/dL — ABNORMAL HIGH (ref 70–99)
Potassium: 2.8 mmol/L — ABNORMAL LOW (ref 3.5–5.1)
Sodium: 120 mmol/L — ABNORMAL LOW (ref 135–145)
Total Bilirubin: 1.4 mg/dL — ABNORMAL HIGH (ref 0.3–1.2)
Total Protein: 7.5 g/dL (ref 6.5–8.1)

## 2021-07-24 LAB — URINALYSIS, ROUTINE W REFLEX MICROSCOPIC
Bacteria, UA: NONE SEEN
Bilirubin Urine: NEGATIVE
Glucose, UA: NEGATIVE mg/dL
Hgb urine dipstick: NEGATIVE
Ketones, ur: NEGATIVE mg/dL
Leukocytes,Ua: NEGATIVE
Nitrite: POSITIVE — AB
Protein, ur: NEGATIVE mg/dL
Specific Gravity, Urine: 1.001 — ABNORMAL LOW (ref 1.005–1.030)
pH: 6 (ref 5.0–8.0)

## 2021-07-24 LAB — METHYLMALONIC ACID, SERUM: Methylmalonic Acid, Quantitative: 149 nmol/L (ref 0–378)

## 2021-07-24 LAB — RESP PANEL BY RT-PCR (FLU A&B, COVID) ARPGX2
Influenza A by PCR: POSITIVE — AB
Influenza B by PCR: NEGATIVE
SARS Coronavirus 2 by RT PCR: NEGATIVE

## 2021-07-24 LAB — MAGNESIUM: Magnesium: 1.9 mg/dL (ref 1.7–2.4)

## 2021-07-24 LAB — BRAIN NATRIURETIC PEPTIDE: B Natriuretic Peptide: 343.3 pg/mL — ABNORMAL HIGH (ref 0.0–100.0)

## 2021-07-24 LAB — LACTIC ACID, PLASMA: Lactic Acid, Venous: 1.9 mmol/L (ref 0.5–1.9)

## 2021-07-24 MED ORDER — TIZANIDINE HCL 4 MG PO TABS
2.0000 mg | ORAL_TABLET | Freq: Once | ORAL | Status: AC
  Administered 2021-07-25: 2 mg via ORAL
  Filled 2021-07-24: qty 1

## 2021-07-24 MED ORDER — SODIUM CHLORIDE 0.9 % IV BOLUS
500.0000 mL | Freq: Once | INTRAVENOUS | Status: AC
  Administered 2021-07-24: 500 mL via INTRAVENOUS

## 2021-07-24 MED ORDER — HYDROMORPHONE HCL 1 MG/ML IJ SOLN
0.5000 mg | Freq: Once | INTRAMUSCULAR | Status: AC
  Administered 2021-07-24: 0.5 mg via INTRAVENOUS
  Filled 2021-07-24: qty 1

## 2021-07-24 MED ORDER — IOHEXOL 350 MG/ML SOLN
80.0000 mL | Freq: Once | INTRAVENOUS | Status: AC | PRN
  Administered 2021-07-24: 80 mL via INTRAVENOUS

## 2021-07-24 MED ORDER — LOPERAMIDE HCL 2 MG PO CAPS: 2.0000 mg | ORAL_CAPSULE | ORAL | Status: DC | PRN

## 2021-07-24 MED ORDER — CIPROFLOXACIN IN D5W 200 MG/100ML IV SOLN
200.0000 mg | Freq: Two times a day (BID) | INTRAVENOUS | Status: DC
  Administered 2021-07-24 – 2021-07-26 (×4): 200 mg via INTRAVENOUS
  Filled 2021-07-24 (×5): qty 100

## 2021-07-24 MED ORDER — POTASSIUM CHLORIDE 10 MEQ/100ML IV SOLN
10.0000 meq | INTRAVENOUS | Status: AC
  Administered 2021-07-24 (×4): 10 meq via INTRAVENOUS
  Filled 2021-07-24 (×4): qty 100

## 2021-07-24 MED ORDER — FENTANYL CITRATE PF 50 MCG/ML IJ SOSY
50.0000 ug | PREFILLED_SYRINGE | Freq: Once | INTRAMUSCULAR | Status: AC
  Administered 2021-07-24: 50 ug via INTRAVENOUS
  Filled 2021-07-24: qty 1

## 2021-07-24 MED ORDER — IPRATROPIUM-ALBUTEROL 0.5-2.5 (3) MG/3ML IN SOLN: 3.0000 mL | Freq: Once | RESPIRATORY_TRACT | Status: DC

## 2021-07-24 MED ORDER — FENTANYL CITRATE PF 50 MCG/ML IJ SOSY
25.0000 ug | PREFILLED_SYRINGE | Freq: Once | INTRAMUSCULAR | Status: AC
  Administered 2021-07-24: 25 ug via INTRAVENOUS
  Filled 2021-07-24: qty 1

## 2021-07-24 MED ORDER — FENTANYL CITRATE PF 50 MCG/ML IJ SOSY
12.5000 ug | PREFILLED_SYRINGE | Freq: Once | INTRAMUSCULAR | Status: AC
  Administered 2021-07-25: 12.5 ug via INTRAVENOUS
  Filled 2021-07-24: qty 1

## 2021-07-24 MED ORDER — SODIUM CHLORIDE 0.9 % IV SOLN: INTRAVENOUS | Status: DC

## 2021-07-24 NOTE — ED Notes (Signed)
Pt had no urinary or BM output during bathroom assistance. Pt removed from bed-pan.

## 2021-07-24 NOTE — ED Notes (Signed)
Pt placed on bedpan

## 2021-07-24 NOTE — ED Provider Notes (Signed)
Care of the patient assumed at the change of shift. Here for confusion, hematuria, lower abdominal pain. Recent admission for influenza. UA with nitrates, reflex micro not done. She is awaiting labs and CT imaging.  Physical Exam  BP (!) 154/102    Pulse (!) 106    Temp 97.7 F (36.5 C) (Oral)    Resp (!) 24    Ht 5\' 1"  (1.549 m)    Wt 65.8 kg    SpO2 96%    BMI 27.40 kg/m   Physical Exam  ED Course/Procedures   Clinical Course as of 07/24/21 1837  Tue Jul 24, 2021  1624 Urinalysis, Routine w reflex microscopic Urine, Catheterized(!) Nitrite positive. [NP]  0034 CBC shows increased WBC from recent admission when she was discharged with oral prednisone.  [CS]  1657 CMP with sodium lower than recent admission, may account for her weakness and confusion.  [CS]  1658 Lactic acid is normal.  [CS]  1718 BNP is not significantly elevated.  [CS]  9179 CT head without acute process.  [CS]  1505 CT abd/pel without acute process to explain her lower abdominal pain. Pain was not relieved with bladder decompression.  [CS]  6979 Spoke with Dr. Flossie Buffy, Hospitalist, who will evaluate for admission.  [CS]    Clinical Course User Index [CS] Truddie Hidden, MD [NP] Davonna Belling, MD    Procedures  MDM  Medical Decision Making Problems Addressed: Hypokalemia: acute illness or injury Hyponatremia: acute illness or injury Lower abdominal pain: acute illness or injury  Amount and/or Complexity of Data Reviewed Labs:  Decision-making details documented in ED Course. Radiology:  Decision-making details documented in ED Course.  Risk Decision regarding hospitalization.          Truddie Hidden, MD 07/24/21 Bosie Helper

## 2021-07-24 NOTE — ED Notes (Signed)
Lab to add-on magnesium to previous collection 

## 2021-07-24 NOTE — ED Provider Notes (Signed)
Emergency Medicine Provider Triage Evaluation Note  Michelle Gonzales , a 81 y.o. female  was evaluated in triage.  Patient was recently discharged from the hospital with diagnosis of the flu.  She apparently stayed for 1 night. Apparently since then, she has had grossly worsening dysuria.  She had 1 episode of hematuria today with a large amount of blood in urine.  She has having significant pelvic tenderness.  Of note, she also has been having low oxygen readings at home down into the 80s.  She has a history of COPD and has been prescribed albuterol inhalers at home with no relief. Endorses shortness of breath.  Physical Exam  BP (!) 146/93 (BP Location: Left Arm)    Pulse (!) 114    Temp 97.7 F (36.5 C) (Oral)    Ht 5\' 1"  (1.549 m)    Wt 65.8 kg    SpO2 94%    BMI 27.40 kg/m  Gen:   Awake, no distress   Resp:  Normal effort. Shallow breaths. Some bilateral wheezing of lungs.  MSK:   Moves extremities without difficulty  Other:  Significant abdominal tenderness to suprapubic region.   Medical Decision Making  Medically screening exam initiated at 11:43 AM.  Appropriate orders placed.  Michelle Gonzales was informed that the remainder of the evaluation will be completed by another provider, this initial triage assessment does not replace that evaluation, and the importance of remaining in the ED until their evaluation is complete.   Michelle Birchwood, PA-C 07/24/21 1146    Michelle Lemming, MD 07/24/21 1451

## 2021-07-24 NOTE — ED Triage Notes (Signed)
Granddaughter states  pt was released 2 days ago from hospital with flu, today hematuria and back pain. Weakness and shob

## 2021-07-24 NOTE — ED Notes (Signed)
Pt has returned from CT.  

## 2021-07-24 NOTE — H&P (Signed)
History and Physical    [name]:[id number] DOB: 1940-11-18 DOA: 07/24/2021  PCP: [name], MD  Patient coming from: Home  I have personally briefly reviewed patient's old medical records in [place]  Chief Complaint: abdominal pain  HPI: Michelle Gonzales is a 81 y.o. female with medical history significant for remote hx of breast cancer s/p right lumpectomy on surveillance, COPD, paroxysmal atrial fibrillation on Eliquis who presents with concerns of lower abdominal pain.  Starting yesterday, she began to have lower/suprapubic catheter pain and increase urinary urgency.  Took Azo and cranberry juice at home without relief.  Then today also noticed hematuria and had acute urinary retention. Patient was recently admitted for observation on 12/29-12/30 for acute hypoxia secondary to COPD exacerbation with influenza.  Also found to be hyponatremic at that time down to sodium of 122.  She was discharged home with prednisone and doxycycline. Since returning home, she has continued to feel poorly.  Reports persistent daily diarrhea up to 4-5 times per day that has never resolved since her last admission.  Has not really been eating and drinking Pedialyte and attempts to stay hydrated.  ED Course: She was afebrile, mildly hypertensive with BP of 150/100 intermittently likely due to pain.  Foley catheter was placed in the ED due to acute urinary retention with UA showing positive nitrite, negative leukocyte.  WBC of 13.2K, hemoglobin appears hemoconcentrated at 15.1. Sodium of 120, K of 2.8, creatinine stable at 0.8, BG of 121.  CT of the abdomen and pelvis negative for any acute process.  Review of Systems: Constitutional: No Fever ENT/Mouth: No sore throat Eyes: No Vision Changes Cardiovascular: No Chest Pain, no SOB, No Edema Respiratory: No Cough Gastrointestinal: +Nausea, No Vomiting, + Diarrhea, No Constipation, + Pain Genitourinary: no Urinary Incontinence,  +Urgency, No Flank Pain Musculoskeletal: No Arthralgias, No Myalgias Skin: No Skin Lesions, No Pruritus, Neuro: no Weakness, No Numbness Psych: + decrease appetite Heme/Lymph: No Bruising, No Bleeding  Past Medical History:  Diagnosis Date   Allergy    Pcn, Sulfa, Codeine   Anxiety    Arthritis    Atrial fibrillation (HCC) 09/22/2017   Cataract    surgey b/l   COPD (chronic obstructive pulmonary disease) (HCC)    Cough    Depression    hx years ago   Hordeolum internum right lower eyelid 05/11/2020   Hot flashes    Shortness of breath    Sjogren's disease ([place])     Past Surgical History:  Procedure Laterality Date   ABDOMINAL HYSTERECTOMY  74   partial   BREAST LUMPECTOMY WITH NEEDLE LOCALIZATION AND AXILLARY SENTINEL LYMPH NODE BX Right 02/01/2013   Procedure: BREAST LUMPECTOMY WITH NEEDLE LOCALIZATION AND AXILLARY SENTINEL LYMPH NODE BX;  Surgeon: Odis Hollingshead, MD;  Location: [facility];  Service: General;  Laterality: Right;   BREAST SURGERY Right 1965   biopsy   CERVICAL FUSION  2010   CHOLECYSTECTOMY  2002   COLONOSCOPY     DILATION AND CURETTAGE OF UTERUS     LEFT HEART CATH AND CORONARY ANGIOGRAPHY N/A 09/24/2017   Procedure: LEFT HEART CATH AND CORONARY ANGIOGRAPHY;  Surgeon: Leonie Man, MD;  Location: [facility];  Service: Cardiovascular;  Laterality: N/A;   ULTRASOUND GUIDANCE FOR VASCULAR ACCESS Right 09/24/2017   Procedure: Ultrasound Guidance For Vascular Access;  Surgeon: Leonie Man, MD;  Location: [facility];  Service: Cardiovascular;  Laterality: Right;  reports that she has been smoking cigarettes. She has been smoking an average of .5 packs per day. She has never used smokeless tobacco. She reports that she does not currently use alcohol. She reports that she does not use drugs. Social History  Allergies  Allergen Reactions   Demerol [Meperidine] Nausea And Vomiting and Other (See Comments)    Bad  headache ,  Nausea/Vomiting.   Penicillins     Has patient had a PCN reaction causing immediate rash, facial/tongue/throat swelling, SOB or lightheadedness with hypotension: YES Has patient had a PCN reaction causing severe rash involving mucus membranes or skin necrosis: NO Has patient had a PCN reaction that required hospitalization: NO Has patient had a PCN reaction occurring within the last 10 years: NO If all of the above answers are "NO", then may proceed with Cephalosporin use.    Codeine Other (See Comments)    Massive headache   Sulfa Antibiotics Itching and Rash    Family History  Problem Relation Age of Onset   Breast cancer Mother      Prior to Admission medications   Medication Sig Start Date End Date Taking? Authorizing Provider  albuterol (VENTOLIN HFA) 108 (90 Base) MCG/ACT inhaler Inhale 2 puffs into the lungs every 6 (six) hours as needed for wheezing or shortness of breath. 07/20/21   Pokhrel, Corrie Mckusick, MD  apixaban (ELIQUIS) 5 MG TABS tablet Take 1 tablet (5 mg total) by mouth 2 (two) times daily. 11/20/20   [name], [name], MD  benzonatate (TESSALON) 200 MG capsule Take 1 capsule (200 mg total) by mouth 3 (three) times daily as needed for cough. 07/20/21   Pokhrel, Corrie Mckusick, MD  Carboxymethylcellul-Glycerin (LUBRICATING EYE DROPS OP) Place 1 drop into both eyes daily as needed (Dry Eyes).    [provider]  cholecalciferol (VITAMIN D) 1000 units tablet Take 1,000 Units by mouth daily.     [provider]  metoprolol tartrate (LOPRESSOR) 25 MG tablet Take 1 tablet (25 mg total) by mouth 2 (two) times daily. 06/19/21   [name], [name], MD  Misc Natural Products (OSTEO BI-FLEX TRIPLE STRENGTH PO) Take 1 tablet by mouth daily.    [provider]  montelukast (SINGULAIR) 10 MG tablet Take 10 mg by mouth at bedtime.  02/01/13   [provider]  sertraline (ZOLOFT) 50 MG tablet Take 50 mg by mouth daily. 02/21/20   [provider]   traZODone (DESYREL) 50 MG tablet Take 25 tablets by mouth at bedtime as needed for sleep. 08/17/18   [provider]  varenicline (CHANTIX CONTINUING MONTH PAK) 1 MG tablet Take 1 tablet (1 mg total) by mouth 2 (two) times daily. Patient not taking: Reported on 07/20/2021 03/30/21   [name], [name], MD  varenicline (CHANTIX STARTING MONTH PAK) 0.5 MG X 11 & 1 MG X 42 tablet Take one 0.5 mg tablet by mouth once daily for 3 days, then increase to one 0.5 mg tablet twice daily for 4 days, then increase to one 1 mg tablet twice daily. Patient not taking: Reported on 07/20/2021 03/30/21   [name], [name], MD  vitamin C (ASCORBIC ACID) 500 MG tablet Take 500 mg by mouth daily.     [provider]    Physical Exam: Vitals:   07/24/21 1654 07/24/21 1700 07/24/21 1800 07/24/21 1830  BP: 121/84 133/76 (!) 142/99 125/87  Pulse: 93 90 78 66  Resp: (!) 23 (!) 21 (!) 22 (!) 24  Temp:  TempSrc:      SpO2: 96% 91% 93% 92%  Weight:      Height:        Constitutional: NAD, non-toxic appearing elderly female laying in bed in discomfort due to LE cramping  Vitals:   07/24/21 1654 07/24/21 1700 07/24/21 1800 07/24/21 1830  BP: 121/84 133/76 (!) 142/99 125/87  Pulse: 93 90 78 66  Resp: (!) 23 (!) 21 (!) 22 (!) 24  Temp:      TempSrc:      SpO2: 96% 91% 93% 92%  Weight:      Height:       Eyes: lids and conjunctivae normal ENMT: Mucous membranes are moist.  Neck: normal, supple Respiratory: clear to auscultation bilaterally, no wheezing, no crackles. Normal respiratory effort on room air Cardiovascular: Regular rate and rhythm, no murmurs / rubs / gallops. No extremity edema.  Abdomen: Soft, nondistended, mild lower quadrant pain worse in the left lower quadrant, no rebound tenderness or guarding or rigidity. bowel sounds positive.  Musculoskeletal: no clubbing / cyanosis. No joint deformity upper and lower extremities. Good ROM, no contractures. Normal muscle tone.  Skin: no  rashes, lesions, ulcers.  Neurologic: CN 2-12 grossly intact.  Strength 5/5 in all 4.  Psychiatric: Normal judgment and insight. Alert and oriented x 3. Normal mood.     Labs on Admission: I have personally reviewed following labs and imaging studies  CBC: Recent Labs  Lab 07/19/21 1755 07/20/21 0053 07/24/21 1328  WBC 6.4 5.6 13.2*  NEUTROABS 5.0 4.7 9.4*  HGB 13.6 13.4 15.1*  HCT 39.1 39.1 43.6  MCV 86.9 88.7 86.2  PLT 170 155 790   Basic Metabolic Panel: Recent Labs  Lab 07/19/21 1755 07/20/21 0053 07/24/21 1328  NA 122* 130* 120*  K 3.2* 3.8 2.8*  CL 87* 97* 86*  CO2 24 23 24   GLUCOSE 191* 147* 121*  BUN 11 8 13   CREATININE 0.89 0.61 0.80  CALCIUM 8.4* 8.4* 8.4*  MG  --  2.0  --   PHOS  --  3.0  --    GFR: Estimated Creatinine Clearance: 49.5 mL/min (by C-G formula based on SCr of 0.8 mg/dL). Liver Function Tests: Recent Labs  Lab 07/20/21 0053 07/24/21 1328  AST 26 21  ALT 24 24  ALKPHOS 70 69  BILITOT 0.8 1.4*  PROT 7.5 7.5  ALBUMIN 3.6 3.9   No results for input(s): LIPASE, AMYLASE in the last 168 hours. No results for input(s): AMMONIA in the last 168 hours. Coagulation Profile: No results for input(s): INR, PROTIME in the last 168 hours. Cardiac Enzymes: No results for input(s): CKTOTAL, CKMB, CKMBINDEX, TROPONINI in the last 168 hours. BNP (last 3 results) No results for input(s): PROBNP in the last 8760 hours. HbA1C: No results for input(s): HGBA1C in the last 72 hours. CBG: No results for input(s): GLUCAP in the last 168 hours. Lipid Profile: No results for input(s): CHOL, HDL, LDLCALC, TRIG, CHOLHDL, LDLDIRECT in the last 72 hours. Thyroid Function Tests: No results for input(s): TSH, T4TOTAL, FREET4, T3FREE, THYROIDAB in the last 72 hours. Anemia Panel: No results for input(s): VITAMINB12, FOLATE, FERRITIN, TIBC, IRON, RETICCTPCT in the last 72 hours. Urine analysis:    Component Value Date/Time   COLORURINE AMBER (A) 07/24/2021  1328   APPEARANCEUR CLEAR 07/24/2021 1328   LABSPEC 1.001 (L) 07/24/2021 1328   PHURINE 6.0 07/24/2021 1328   GLUCOSEU NEGATIVE 07/24/2021 1328   HGBUR NEGATIVE 07/24/2021 [place] 07/24/2021  [place] 07/24/2021 [place] 07/24/2021 1328   NITRITE POSITIVE (A) 07/24/2021 1328   LEUKOCYTESUR NEGATIVE 07/24/2021 1328    Radiological Exams on Admission: DG Chest 2 View  Result Date: 07/24/2021 CLINICAL DATA:  Short of breath EXAM: CHEST - 2 VIEW COMPARISON:  Prior chest x-ray 11/17/2020 FINDINGS: Stable cardiac mediastinal contours. Atherosclerotic calcifications present in the transverse aorta. No pulmonary edema, pleural effusion, pneumothorax or focal airspace infiltrate. Mild bronchitic changes are stable compared to prior. No acute osseous abnormality. IMPRESSION: Stable chest x-ray without evidence of acute cardiopulmonary process. Electronically Signed   By: Jacqulynn Cadet M.D.   On: 07/24/2021 13:04   CT HEAD WO CONTRAST (5MM)  Result Date: 07/24/2021 CLINICAL DATA:  Mental status change. EXAM: CT HEAD WITHOUT CONTRAST TECHNIQUE: Contiguous axial images were obtained from the base of the skull through the vertex without intravenous contrast. COMPARISON:  None. FINDINGS: Brain: Ventricle size and cerebral volume normal. Extensive hypodensity throughout the cerebral white matter bilaterally, symmetric. Negative for acute infarct, hemorrhage, mass. Vascular: Negative for hyperdense vessel Skull: Negative Sinuses/Orbits: Mild mucosal edema paranasal sinuses. Bilateral cataract extraction Other: None IMPRESSION: No acute abnormality. Extensive white matter hypodensity likely due to chronic microvascular ischemia. Correlate with risk factors. Electronically Signed   By: Franchot Gallo M.D.   On: 07/24/2021 17:38   CT ABDOMEN PELVIS W CONTRAST  Result Date: 07/24/2021 CLINICAL DATA:  Left lower quadrant abdominal pain. Recent diagnosis of  influenza and hospitalization. EXAM: CT ABDOMEN AND PELVIS WITH CONTRAST TECHNIQUE: Multidetector CT imaging of the abdomen and pelvis was performed using the standard protocol following bolus administration of intravenous contrast. CONTRAST:  40mL OMNIPAQUE IOHEXOL 350 MG/ML SOLN COMPARISON:  CT of the abdomen pelvis 05/28/2019 FINDINGS: Lower chest: Mild dependent atelectasis or scarring is present at the left base. Lungs are otherwise clear. Heart size is normal. No significant pleural or pericardial effusion is present. Hepatobiliary: No focal liver abnormality is seen. Status post cholecystectomy. No biliary dilatation. Pancreas: Unremarkable. No pancreatic ductal dilatation or surrounding inflammatory changes. Spleen: Normal in size without focal abnormality. Adrenals/Urinary Tract: Adrenal glands are within normal limits bilaterally. Bilateral renal cysts are present. The largest is a central sinus cyst of the left kidney similar the prior exam. The cyst measures 4 cm. Additional bilateral cysts are present. No definite stone or solid mass lesion scratched at no definite stone is present. No solid mass lesion is present. No obstruction is present. Ureters within normal limits bilaterally. A Foley catheter is present within the urinary bladder. The bladder is collapsed. Stomach/Bowel: A small hiatal hernia is again seen. Previously noted gastric wall thickening is not present on today's study. A duodenal ulcer present without inflammation. Duodenum is otherwise unremarkable. Small bowel is within normal limits. The stomach and duodenum are within normal limits. Small bowel is unremarkable. The terminal ileum is within normal limits. The appendix is visualized and normal. The ascending and transverse colon are within normal limits. The distal transverse colon, descending colon, and sigmoid colon are collapsed. No inflammatory changes are present. Vascular/Lymphatic: Atherosclerotic changes are present in the  aorta and branch vessels. No aneurysm is present. No significant adenopathy is present. Reproductive: Status post hysterectomy. No adnexal masses. Other: No abdominal wall hernia or abnormality. No abdominopelvic ascites. Musculoskeletal: Multilevel degenerative facet changes are noted in the lower lumbar spine. No focal osseous lesions are present. Posterior facets are fused at L5-S1. Bony pelvis is within normal limits. The hips are located  and within normal limits. IMPRESSION: 1. No acute or focal lesion to explain the patient's left lower quadrant abdominal pain. The descending and sigmoid colon are collapsed without evidence for inflammation. 2. Bilateral renal cysts. 3. Foley catheter within the urinary bladder. 4. Cholecystectomy and hysterectomy. 5. Aortic Atherosclerosis (ICD10-I70.0). Electronically Signed   By: San Morelle M.D.   On: 07/24/2021 17:43      Assessment/Plan  Acute hypovolemic hyponatremia Pt has decrease intake and persistent diarrhea Na of 120. Give 500cc bolus and continuous IV fluids at 50cc/hr NS  Follow Na q4hr   Hypokalemia Replete with IV K 10 meqx4   Hypocalcemia  Replete with IV calcium gluconate  UTI/Acute urinary retention  indwelling catheter placed in ED  urine culture pending start IV Cipro due to PCN allergy   Diarrhea check GI stool panel/C.diff test  Imodium PRN   Paroxysmal atrial fibrillation Continue Eliquis  COPD Not in acute exacerbation  Recent Flu A  Positive on 12/29. Not treated with Tamiflu at last admission since she was outside window. Symptoms are improving  DVT prophylaxis:Eliquis Code Status: Full Family Communication: Plan discussed with patient and daughter in-law at bedside  disposition Plan: Home with observation Consults called:  Admission status: Observation  Level of care: Telemetry  Status is: Observation  The patient remains OBS appropriate and will d/c before 2 midnights.        Orene Desanctis  DO [facility]   If 7PM-7AM, please contact night-coverage [email or web address]   07/24/2021, 6:48 PM

## 2021-07-24 NOTE — ED Notes (Signed)
Patient transported to CT 

## 2021-07-24 NOTE — ED Provider Notes (Signed)
[facility] DEPT Provider Note   CSN: [id number] Arrival date & time: 07/24/21  1119     History  Chief Complaint  Patient presents with   Weakness   Hematuria    Michelle Gonzales is a 81 y.o. female.   Weakness Hematuria Patient presents for hematuria lower abdominal pain.  Discharged from the hospital around 4 days ago after a flu admission.  Also had COPD exacerbation at that time.  Had been discharged home.  Had reportedly been doing better but then began to feel dysuria blood in the urine and states she has a kidney infection.  Pain is in the lower abdomen.  States that with passing some blood.  Here unable to urinate.  Also reportedly had some oxygen saturations down to the 80s at home.  States may have started having some fevers again.  No nausea or vomiting.  No diarrhea. History of A. fib.  On anticoagulation for.  Reportedly not take her Eliquis this morning however.  Does appear to have some confusion.  Patient presents with her daughter.     Home Medications Prior to Admission medications   Medication Sig Start Date End Date Taking? Authorizing Provider  albuterol (VENTOLIN HFA) 108 (90 Base) MCG/ACT inhaler Inhale 2 puffs into the lungs every 6 (six) hours as needed for wheezing or shortness of breath. 07/20/21   Pokhrel, Corrie Mckusick, MD  apixaban (ELIQUIS) 5 MG TABS tablet Take 1 tablet (5 mg total) by mouth 2 (two) times daily. 11/20/20   [name], [name], MD  benzonatate (TESSALON) 200 MG capsule Take 1 capsule (200 mg total) by mouth 3 (three) times daily as needed for cough. 07/20/21   Pokhrel, Corrie Mckusick, MD  Carboxymethylcellul-Glycerin (LUBRICATING EYE DROPS OP) Place 1 drop into both eyes daily as needed (Dry Eyes).    [provider]  cholecalciferol (VITAMIN D) 1000 units tablet Take 1,000 Units by mouth daily.     [provider]  metoprolol tartrate (LOPRESSOR) 25 MG tablet Take 1 tablet (25 mg total) by mouth 2 (two) times  daily. 06/19/21   [name], [name], MD  Misc Natural Products (OSTEO BI-FLEX TRIPLE STRENGTH PO) Take 1 tablet by mouth daily.    [provider]  montelukast (SINGULAIR) 10 MG tablet Take 10 mg by mouth at bedtime.  02/01/13   [provider]  sertraline (ZOLOFT) 50 MG tablet Take 50 mg by mouth daily. 02/21/20   [provider]  traZODone (DESYREL) 50 MG tablet Take 25 tablets by mouth at bedtime as needed for sleep. 08/17/18   [provider]  varenicline (CHANTIX CONTINUING MONTH PAK) 1 MG tablet Take 1 tablet (1 mg total) by mouth 2 (two) times daily. Patient not taking: Reported on 07/20/2021 03/30/21   [name], [name], MD  varenicline (CHANTIX STARTING MONTH PAK) 0.5 MG X 11 & 1 MG X 42 tablet Take one 0.5 mg tablet by mouth once daily for 3 days, then increase to one 0.5 mg tablet twice daily for 4 days, then increase to one 1 mg tablet twice daily. Patient not taking: Reported on 07/20/2021 03/30/21   [name], [name], MD  vitamin C (ASCORBIC ACID) 500 MG tablet Take 500 mg by mouth daily.     [provider]      Allergies    Demerol [meperidine], Penicillins, Codeine, and Sulfa antibiotics    Review of Systems   Review of Systems  Genitourinary:  Positive for hematuria.  Neurological:  Positive  for weakness.   Physical Exam Updated Vital Signs BP (!) 154/102    Pulse (!) 106    Temp 97.7 F (36.5 C) (Oral)    Resp (!) 24    Ht 5\' 1"  (1.549 m)    Wt 65.8 kg    SpO2 96%    BMI 27.40 kg/m  Physical Exam Vitals and nursing note reviewed.  HENT:     Head: Normocephalic.  Eyes:     Pupils: Pupils are equal, round, and reactive to light.  Cardiovascular:     Rate and Rhythm: Tachycardia present. Rhythm irregular.  Pulmonary:     Comments: Harsh breath sounds with few wheezes. Abdominal:     Tenderness: There is abdominal tenderness.     Comments: Lower abdominal tenderness with suprapubic fullness.  Musculoskeletal:        General: No  tenderness.  Skin:    General: Skin is warm.     Capillary Refill: Capillary refill takes less than 2 seconds.  Neurological:     Mental Status: She is alert.     Comments: Awake and pleasant but appears to have some confusion.    ED Results / Procedures / Treatments   Labs (all labs ordered are listed, but only abnormal results are displayed) Labs Reviewed  URINALYSIS, ROUTINE W REFLEX MICROSCOPIC - Abnormal; Notable for the following components:      Result Value   Color, Urine AMBER (*)    Specific Gravity, Urine 1.001 (*)    Nitrite POSITIVE (*)    All other components within normal limits  URINE CULTURE  COMPREHENSIVE METABOLIC PANEL  CBC WITH DIFFERENTIAL/PLATELET  LACTIC ACID, PLASMA  LACTIC ACID, PLASMA  BRAIN NATRIURETIC PEPTIDE    EKG EKG Interpretation  Date/Time:  Tuesday July 24 2021 11:44:36 EST Ventricular Rate:  115 PR Interval:    QRS Duration: 82 QT Interval:  321 QTC Calculation: 444 R Axis:   28 Text Interpretation: Atrial fibrillation Anterior infarct, old Borderline ST depression, diffuse leads Confirmed by Davonna Belling [phone]) on 07/24/2021 1:33:26 PM  Radiology DG Chest 2 View  Result Date: 07/24/2021 CLINICAL DATA:  Short of breath EXAM: CHEST - 2 VIEW COMPARISON:  Prior chest x-ray 11/17/2020 FINDINGS: Stable cardiac mediastinal contours. Atherosclerotic calcifications present in the transverse aorta. No pulmonary edema, pleural effusion, pneumothorax or focal airspace infiltrate. Mild bronchitic changes are stable compared to prior. No acute osseous abnormality. IMPRESSION: Stable chest x-ray without evidence of acute cardiopulmonary process. Electronically Signed   By: Jacqulynn Cadet M.D.   On: 07/24/2021 13:04    Procedures Procedures    Medications Ordered in ED Medications  ipratropium-albuterol (DUONEB) 0.5-2.5 (3) MG/3ML nebulizer solution 3 mL (3 mLs Nebulization Patient Refused/Not Given 07/24/21 1507)  fentaNYL (SUBLIMAZE)  injection 50 mcg (50 mcg Intravenous Given 07/24/21 1359)    ED Course/ Medical Decision Making/ A&P Clinical Course as of 07/24/21 1627  Tue Jul 24, 2021  1624 Urinalysis, Routine w reflex microscopic Urine, Catheterized(!) Nitrite positive. [NP]    Clinical Course User Index [NP] Davonna Belling, MD                           Medical Decision Making Patient with abdominal pain and some confusion.  Recent admission the hospital for pneumonia.  Also has had hematuria.  Urinary retention found and Foley catheter placed.  Urine however does not show clear infection.  Cultures been sent.  Reportedly had sats of 80% at  home.  Chest x-ray reassuring.  Head CT and abdomen pelvis CT ordered due to abdominal tenderness.   Amount and/or Complexity of Data Reviewed Independent Historian:     Details: daughter Labs:  Decision-making details documented in ED Course. Radiology:  Decision-making details documented in ED Course.   Presented for mental status change.  Long differential diagnosis.  Abdominal tenderness with urinary retention.  Lab work pending.  Will get CT scan and care turned over to Dr. Karle Starch        Final Clinical Impression(s) / ED Diagnoses Final diagnoses:  None    Rx / DC Orders ED Discharge Orders     None         Davonna Belling, MD 07/24/21 1627

## 2021-07-25 DIAGNOSIS — F17210 Nicotine dependence, cigarettes, uncomplicated: Secondary | ICD-10-CM | POA: Diagnosis present

## 2021-07-25 DIAGNOSIS — E876 Hypokalemia: Secondary | ICD-10-CM | POA: Diagnosis present

## 2021-07-25 DIAGNOSIS — Z885 Allergy status to narcotic agent status: Secondary | ICD-10-CM | POA: Diagnosis not present

## 2021-07-25 DIAGNOSIS — Z882 Allergy status to sulfonamides status: Secondary | ICD-10-CM | POA: Diagnosis not present

## 2021-07-25 DIAGNOSIS — R1030 Lower abdominal pain, unspecified: Secondary | ICD-10-CM

## 2021-07-25 DIAGNOSIS — Z20822 Contact with and (suspected) exposure to covid-19: Secondary | ICD-10-CM | POA: Diagnosis present

## 2021-07-25 DIAGNOSIS — K269 Duodenal ulcer, unspecified as acute or chronic, without hemorrhage or perforation: Secondary | ICD-10-CM | POA: Diagnosis present

## 2021-07-25 DIAGNOSIS — Z7901 Long term (current) use of anticoagulants: Secondary | ICD-10-CM | POA: Diagnosis not present

## 2021-07-25 DIAGNOSIS — Z853 Personal history of malignant neoplasm of breast: Secondary | ICD-10-CM | POA: Diagnosis not present

## 2021-07-25 DIAGNOSIS — I4892 Unspecified atrial flutter: Secondary | ICD-10-CM | POA: Diagnosis present

## 2021-07-25 DIAGNOSIS — R319 Hematuria, unspecified: Secondary | ICD-10-CM | POA: Diagnosis present

## 2021-07-25 DIAGNOSIS — R339 Retention of urine, unspecified: Secondary | ICD-10-CM | POA: Diagnosis present

## 2021-07-25 DIAGNOSIS — E860 Dehydration: Secondary | ICD-10-CM | POA: Diagnosis present

## 2021-07-25 DIAGNOSIS — N390 Urinary tract infection, site not specified: Secondary | ICD-10-CM | POA: Diagnosis not present

## 2021-07-25 DIAGNOSIS — R3915 Urgency of urination: Secondary | ICD-10-CM | POA: Diagnosis present

## 2021-07-25 DIAGNOSIS — J449 Chronic obstructive pulmonary disease, unspecified: Secondary | ICD-10-CM | POA: Diagnosis present

## 2021-07-25 DIAGNOSIS — E861 Hypovolemia: Secondary | ICD-10-CM | POA: Diagnosis present

## 2021-07-25 DIAGNOSIS — E871 Hypo-osmolality and hyponatremia: Secondary | ICD-10-CM | POA: Diagnosis present

## 2021-07-25 DIAGNOSIS — Z880 Allergy status to penicillin: Secondary | ICD-10-CM | POA: Diagnosis not present

## 2021-07-25 DIAGNOSIS — I480 Paroxysmal atrial fibrillation: Secondary | ICD-10-CM

## 2021-07-25 DIAGNOSIS — A084 Viral intestinal infection, unspecified: Secondary | ICD-10-CM | POA: Diagnosis present

## 2021-07-25 DIAGNOSIS — Z79899 Other long term (current) drug therapy: Secondary | ICD-10-CM | POA: Diagnosis not present

## 2021-07-25 DIAGNOSIS — Z803 Family history of malignant neoplasm of breast: Secondary | ICD-10-CM | POA: Diagnosis not present

## 2021-07-25 LAB — BASIC METABOLIC PANEL
Anion gap: 8 (ref 5–15)
Anion gap: 8 (ref 5–15)
Anion gap: 8 (ref 5–15)
Anion gap: 9 (ref 5–15)
BUN: 10 mg/dL (ref 8–23)
BUN: 7 mg/dL — ABNORMAL LOW (ref 8–23)
BUN: 8 mg/dL (ref 8–23)
BUN: 9 mg/dL (ref 8–23)
CO2: 22 mmol/L (ref 22–32)
CO2: 24 mmol/L (ref 22–32)
CO2: 25 mmol/L (ref 22–32)
CO2: 25 mmol/L (ref 22–32)
Calcium: 7.9 mg/dL — ABNORMAL LOW (ref 8.9–10.3)
Calcium: 8.2 mg/dL — ABNORMAL LOW (ref 8.9–10.3)
Calcium: 8.4 mg/dL — ABNORMAL LOW (ref 8.9–10.3)
Calcium: 8.8 mg/dL — ABNORMAL LOW (ref 8.9–10.3)
Chloride: 100 mmol/L (ref 98–111)
Chloride: 89 mmol/L — ABNORMAL LOW (ref 98–111)
Chloride: 91 mmol/L — ABNORMAL LOW (ref 98–111)
Chloride: 95 mmol/L — ABNORMAL LOW (ref 98–111)
Creatinine, Ser: 0.57 mg/dL (ref 0.44–1.00)
Creatinine, Ser: 0.57 mg/dL (ref 0.44–1.00)
Creatinine, Ser: 0.65 mg/dL (ref 0.44–1.00)
Creatinine, Ser: 0.66 mg/dL (ref 0.44–1.00)
GFR, Estimated: >60 mL/min (ref >60)
GFR, Estimated: >60 mL/min (ref >60)
GFR, Estimated: >60 mL/min (ref >60)
GFR, Estimated: >60 mL/min (ref >60)
Glucose, Bld: 107 mg/dL — ABNORMAL HIGH (ref 70–99)
Glucose, Bld: 111 mg/dL — ABNORMAL HIGH (ref 70–99)
Glucose, Bld: 114 mg/dL — ABNORMAL HIGH (ref 70–99)
Glucose, Bld: 99 mg/dL (ref 70–99)
Potassium: 3.3 mmol/L — ABNORMAL LOW (ref 3.5–5.1)
Potassium: 3.3 mmol/L — ABNORMAL LOW (ref 3.5–5.1)
Potassium: 3.6 mmol/L (ref 3.5–5.1)
Potassium: 3.8 mmol/L (ref 3.5–5.1)
Sodium: 121 mmol/L — ABNORMAL LOW (ref 135–145)
Sodium: 125 mmol/L — ABNORMAL LOW (ref 135–145)
Sodium: 128 mmol/L — ABNORMAL LOW (ref 135–145)
Sodium: 130 mmol/L — ABNORMAL LOW (ref 135–145)

## 2021-07-25 LAB — SODIUM, URINE, RANDOM: Sodium, Ur: <10 mmol/L

## 2021-07-25 LAB — OSMOLALITY, URINE: Osmolality, Ur: 78 mOsm/kg — ABNORMAL LOW (ref 300–900)

## 2021-07-25 MED ORDER — CALCIUM GLUCONATE-NACL 1-0.675 GM/50ML-% IV SOLN
1.0000 g | Freq: Once | INTRAVENOUS | Status: AC
  Administered 2021-07-25: 1000 mg via INTRAVENOUS

## 2021-07-25 MED ORDER — ACETAMINOPHEN 325 MG PO TABS: 650.0000 mg | ORAL_TABLET | Freq: Four times a day (QID) | ORAL | Status: DC | PRN

## 2021-07-25 MED ORDER — SODIUM CHLORIDE 0.9 % IV SOLN: INTRAVENOUS | Status: DC

## 2021-07-25 MED ORDER — ALBUTEROL SULFATE (2.5 MG/3ML) 0.083% IN NEBU: 2.5000 mg | INHALATION_SOLUTION | Freq: Four times a day (QID) | RESPIRATORY_TRACT | Status: DC | PRN

## 2021-07-25 MED ORDER — FENTANYL CITRATE PF 50 MCG/ML IJ SOSY
12.5000 ug | PREFILLED_SYRINGE | INTRAMUSCULAR | Status: DC | PRN
Start: 2021-07-25 — End: 2021-07-26
  Administered 2021-07-25 (×6): 12.5 ug via INTRAVENOUS
  Filled 2021-07-25 (×5): qty 1

## 2021-07-25 MED ORDER — CALCIUM GLUCONATE-NACL 1-0.675 GM/50ML-% IV SOLN
INTRAVENOUS | Status: AC
  Administered 2021-07-25: 1000 mg via INTRAVENOUS
  Filled 2021-07-25: qty 50

## 2021-07-25 MED ORDER — FENTANYL CITRATE PF 50 MCG/ML IJ SOSY
PREFILLED_SYRINGE | INTRAMUSCULAR | Status: AC
  Filled 2021-07-25: qty 1

## 2021-07-25 MED ORDER — METOPROLOL TARTRATE 25 MG PO TABS
25.0000 mg | ORAL_TABLET | Freq: Two times a day (BID) | ORAL | Status: DC
  Administered 2021-07-25 – 2021-07-26 (×2): 25 mg via ORAL
  Filled 2021-07-25 (×2): qty 1

## 2021-07-25 MED ORDER — MONTELUKAST SODIUM 10 MG PO TABS
10.0000 mg | ORAL_TABLET | Freq: Every day | ORAL | Status: DC
  Administered 2021-07-25: 10 mg via ORAL
  Filled 2021-07-25: qty 1

## 2021-07-25 MED ORDER — PHENAZOPYRIDINE HCL 100 MG PO TABS
100.0000 mg | ORAL_TABLET | Freq: Three times a day (TID) | ORAL | Status: DC
  Administered 2021-07-25 – 2021-07-26 (×4): 100 mg via ORAL
  Filled 2021-07-25 (×5): qty 1

## 2021-07-25 MED ORDER — POTASSIUM CHLORIDE CRYS ER 20 MEQ PO TBCR
40.0000 meq | EXTENDED_RELEASE_TABLET | Freq: Once | ORAL | Status: AC
Start: 2021-07-25 — End: 2021-07-25
  Administered 2021-07-25: 40 meq via ORAL
  Filled 2021-07-25: qty 2

## 2021-07-25 MED ORDER — CHLORHEXIDINE GLUCONATE CLOTH 2 % EX PADS
6.0000 | MEDICATED_PAD | Freq: Every day | CUTANEOUS | Status: DC
  Administered 2021-07-25 – 2021-07-26 (×2): 6 via TOPICAL

## 2021-07-25 MED ORDER — MAGNESIUM SULFATE 2 GM/50ML IV SOLN
2.0000 g | Freq: Once | INTRAVENOUS | Status: AC
  Administered 2021-07-25: 2 g via INTRAVENOUS

## 2021-07-25 MED ORDER — ALBUTEROL SULFATE HFA 108 (90 BASE) MCG/ACT IN AERS: 2.0000 | INHALATION_SPRAY | Freq: Four times a day (QID) | RESPIRATORY_TRACT | Status: DC | PRN

## 2021-07-25 MED ORDER — APIXABAN 5 MG PO TABS
5.0000 mg | ORAL_TABLET | Freq: Two times a day (BID) | ORAL | Status: DC
  Administered 2021-07-25 – 2021-07-26 (×3): 5 mg via ORAL
  Filled 2021-07-25 (×3): qty 1

## 2021-07-25 MED ORDER — MAGNESIUM SULFATE 2 GM/50ML IV SOLN
INTRAVENOUS | Status: AC
  Administered 2021-07-25: 2 g via INTRAVENOUS
  Filled 2021-07-25: qty 50

## 2021-07-25 MED ORDER — HYOSCYAMINE SULFATE 0.125 MG SL SUBL
0.1250 mg | SUBLINGUAL_TABLET | Freq: Four times a day (QID) | SUBLINGUAL | Status: DC | PRN
  Filled 2021-07-25: qty 1

## 2021-07-25 MED ORDER — SODIUM CHLORIDE 0.45 % IV SOLN: INTRAVENOUS | Status: AC

## 2021-07-25 NOTE — ED Notes (Signed)
Pt reporting leg cramping and abd pain - Dr. Flossie Buffy informed.

## 2021-07-25 NOTE — Progress Notes (Addendum)
PROGRESS NOTE   Michelle Gonzales  YTK:[id number]    DOB: [birth year]    DOA: 07/24/2021  PCP: [name], MD   I have briefly reviewed patients previous medical records in [facility].  Chief Complaint  Patient presents with   Weakness   Hematuria    Brief Narrative:  [age] year old married female, independent, medical history significant for remote history of breast cancer s/p right lumpectomy on surveillance, COPD, paroxysmal A. fib on Eliquis, recent hospitalization 12/29-2012/30 for acute hypoxia secondary to COPD exacerbation with influenza and also found to be hyponatremic at that time with sodium of 122, discharged home on prednisone and doxycycline, presented to the ED with complaints of overall feeling poorly since discharge, lower abdominal/suprapubic pain, urinary urgency, hematuria, acute urinary retention, diarrhea with poor oral intake.  Admitted for dehydration with hyponatremia, electrolyte abnormalities, acute urinary retention with suspected acute cystitis, diarrhea and abdominal pain.  Assessment & Plan:  Principal Problem:   Hyponatremia Active Problems:   COPD (chronic obstructive pulmonary disease) (HCC)   Paroxysmal atrial fibrillation (HCC)   Hypokalemia   Acute lower UTI   Hypocalcemia   Dehydration with hyponatremia   Dehydration with hyponatremia: Due to decreased oral intake and GI losses/diarrhea Admission serum sodium 120.  This was 130 upon recent discharge 12/30. Lab work on recent admission: TSH normal at 1.056.  Urine osmolarity: 163, serum osmolarity 279 and urine sodium 14.  Urine sodium <10 on 1/3. Treated with IV saline bolus followed by IV saline 50 mL/h.  Serum sodium up from 120 on admission to 128.  Aim to correct by no more than 8 to 10 mEq in a 24-hour period.  We will cut down IV saline to 30 mL/h.  Encourage oral intake.  Follow BMP later this afternoon. Holding Zoloft temporarily.  Addendum Follow-up serum sodium 130.  Has  corrected by about 10 mEq in 24 hours.  To avoid further rapid correction, change IV fluids to half-normal saline at 50 mL/h follow BMP in AM.  Hypokalemia: Replaced.  Hypocalcemia Replaced and improved.  Acute urinary retention/possible acute cystitis Had UTI symptoms PTA. However urine microscopy only positive for nitrites but no bacteria. Foley catheter inserted on ED arrival but unclear how much urine was drained.  Reportedly bladder scan was not done prior. Continue IV Cipro pending cultures.  Reported penicillin allergy. Added Pyridium due to bladder pain.  Diarrhea/abdominal pain Unclear if infectious versus noninfectious. CT abdomen with contrast: No acute or focal lesion to explain patient's pain. Checking GI and C. difficile panel. No BM since admission.  Could be improving.  Paroxysmal A. fib Telemetry initially on arrival had some atrial flutter with controlled ventricular rate but since then sinus rhythm. Continue apixaban and metoprolol.  COPD without exacerbation  Recent influenza A Positive on 12/29, not treated with Tamiflu last admission since she was outside the window.  Currently without symptoms.  Body mass index is 27.4 kg/m.     DVT prophylaxis:   On apixaban   Code Status: Full Code Family Communication: Discussed with daughter via phone this afternoon. Disposition:  Status is: Inpatient  Remains inpatient appropriate because: Severity of illness        Consultants:   None  Procedures:   None  Antimicrobials:   IV Cipro   Subjective:  Seen this morning in the ED.  Patient's RN at bedside.  Feels somewhat better.  Nausea without vomiting.  Tolerated sandwich for dinner last night.  No BM since hospital  arrival.  Reports persisting significant pain across lower abdomen, rates it as "20/30", constant and unable to say if she has dysuria.  Objective:   Vitals:   07/25/21 0530 07/25/21 0545 07/25/21 0700 07/25/21 0800  BP: (!)  144/90 140/75 133/79 (!) 168/85  Pulse: 80 83 74 86  Resp:  18  18  Temp:      TempSrc:      SpO2: 92% 92% 94% 95%  Weight:      Height:        General exam: Elderly female, moderately built and nourished lying comfortably propped up in bed without distress.  Oral mucosa dry. Respiratory system: Clear to auscultation. Respiratory effort normal. Cardiovascular system: S1 & S2 heard, RRR. No JVD, murmurs, rubs, gallops or clicks. No pedal edema.  Telemetry: Initially briefly, had a flutter with controlled ventricular rate but now in sinus rhythm.  Discontinue telemetry. Gastrointestinal system: Abdomen is nondistended, soft and tenderness in lower quadrants without rigidity, guarding or rebound. No organomegaly or masses felt. Normal bowel sounds heard. GU: Has indwelling Foley catheter with slightly concentrated but normal colored urine without gross hematuria. Central nervous system: Alert and oriented. No focal neurological deficits. Extremities: Symmetric 5 x 5 power. Skin: No rashes, lesions or ulcers Psychiatry: Judgement and insight appear normal. Mood & affect appropriate.     Data Reviewed:   I have personally reviewed following labs and imaging studies   CBC: Recent Labs  Lab 07/19/21 1755 07/20/21 0053 07/24/21 1328  WBC 6.4 5.6 13.2*  NEUTROABS 5.0 4.7 9.4*  HGB 13.6 13.4 15.1*  HCT 39.1 39.1 43.6  MCV 86.9 88.7 86.2  PLT 170 155 638    Basic Metabolic Panel: Recent Labs  Lab 07/20/21 0053 07/24/21 1328 07/25/21 0026 07/25/21 0430 07/25/21 0800  NA 130* 120* 121* 125* 128*  K 3.8 2.8* 3.6 3.3* 3.3*  CL 97* 86* 89* 91* 95*  CO2 23 24 24 25 25   GLUCOSE 147* 121* 114* 111* 107*  BUN 8 13 10 9  7*  CREATININE 0.61 0.80 0.65 0.57 0.66  CALCIUM 8.4* 8.4* 7.9* 8.8* 8.4*  MG 2.0 1.9  --   --   --   PHOS 3.0  --   --   --   --     Liver Function Tests: Recent Labs  Lab 07/20/21 0053 07/24/21 1328  AST 26 21  ALT 24 24  ALKPHOS 70 69  BILITOT 0.8  1.4*  PROT 7.5 7.5  ALBUMIN 3.6 3.9    CBG: No results for input(s): GLUCAP in the last 168 hours.  Microbiology Studies:   Recent Results (from the past 240 hour(s))  Resp Panel by RT-PCR (Flu A&B, Covid) Nasopharyngeal Swab     Status: Abnormal   Collection Time: 07/19/21  5:26 PM   Specimen: Nasopharyngeal Swab; Nasopharyngeal(NP) swabs in vial transport medium  Result Value Ref Range Status   SARS Coronavirus 2 by RT PCR NEGATIVE NEGATIVE Final    Comment: (NOTE) SARS-CoV-2 target nucleic acids are NOT DETECTED.  The SARS-CoV-2 RNA is generally detectable in upper respiratory specimens during the acute phase of infection. The lowest concentration of SARS-CoV-2 viral copies this assay can detect is 138 copies/mL. A negative result does not preclude SARS-Cov-2 infection and should not be used as the sole basis for treatment or other patient management decisions. A negative result may occur with  improper specimen collection/handling, submission of specimen other than nasopharyngeal swab, presence of viral mutation(s) within the areas targeted  by this assay, and inadequate number of viral copies(<138 copies/mL). A negative result must be combined with clinical observations, patient history, and epidemiological information. The expected result is Negative.  Fact Sheet for Patients:  EntrepreneurPulse.com.au  Fact Sheet for Healthcare Providers:  IncredibleEmployment.be  This test is no t yet approved or cleared by the [place] FDA and  has been authorized for detection and/or diagnosis of SARS-CoV-2 by FDA under an Emergency Use Authorization (EUA). This EUA will remain  in effect (meaning this test can be used) for the duration of the COVID-19 declaration under Section 564(b)(1) of the Act, 21 U.S.C.section 360bbb-3(b)(1), unless the authorization is terminated  or revoked sooner.       Influenza A by PCR POSITIVE (A) NEGATIVE  Final   Influenza B by PCR NEGATIVE NEGATIVE Final    Comment: (NOTE) The Xpert Xpress SARS-CoV-2/FLU/RSV plus assay is intended as an aid in the diagnosis of influenza from Nasopharyngeal swab specimens and should not be used as a sole basis for treatment. Nasal washings and aspirates are unacceptable for Xpert Xpress SARS-CoV-2/FLU/RSV testing.  Fact Sheet for Patients: EntrepreneurPulse.com.au  Fact Sheet for Healthcare Providers: IncredibleEmployment.be  This test is not yet approved or cleared by the [place] FDA and has been authorized for detection and/or diagnosis of SARS-CoV-2 by FDA under an Emergency Use Authorization (EUA). This EUA will remain in effect (meaning this test can be used) for the duration of the COVID-19 declaration under Section 564(b)(1) of the Act, 21 U.S.C. section 360bbb-3(b)(1), unless the authorization is terminated or revoked.  Performed at [facility], [address] [place], [place] [address]   Resp Panel by RT-PCR (Flu A&B, Covid) Nasopharyngeal Swab     Status: Abnormal   Collection Time: 07/24/21  6:37 PM   Specimen: Nasopharyngeal Swab; Nasopharyngeal(NP) swabs in vial transport medium  Result Value Ref Range Status   SARS Coronavirus 2 by RT PCR NEGATIVE NEGATIVE Final    Comment: (NOTE) SARS-CoV-2 target nucleic acids are NOT DETECTED.  The SARS-CoV-2 RNA is generally detectable in upper respiratory specimens during the acute phase of infection. The lowest concentration of SARS-CoV-2 viral copies this assay can detect is 138 copies/mL. A negative result does not preclude SARS-Cov-2 infection and should not be used as the sole basis for treatment or other patient management decisions. A negative result may occur with  improper specimen collection/handling, submission of specimen other than nasopharyngeal swab, presence of viral mutation(s) within the areas targeted by this assay,  and inadequate number of viral copies(<138 copies/mL). A negative result must be combined with clinical observations, patient history, and epidemiological information. The expected result is Negative.  Fact Sheet for Patients:  EntrepreneurPulse.com.au  Fact Sheet for Healthcare Providers:  IncredibleEmployment.be  This test is no t yet approved or cleared by the [place] FDA and  has been authorized for detection and/or diagnosis of SARS-CoV-2 by FDA under an Emergency Use Authorization (EUA). This EUA will remain  in effect (meaning this test can be used) for the duration of the COVID-19 declaration under Section 564(b)(1) of the Act, 21 U.S.C.section 360bbb-3(b)(1), unless the authorization is terminated  or revoked sooner.       Influenza A by PCR POSITIVE (A) NEGATIVE Final   Influenza B by PCR NEGATIVE NEGATIVE Final    Comment: (NOTE) The Xpert Xpress SARS-CoV-2/FLU/RSV plus assay is intended as an aid in the diagnosis of influenza from Nasopharyngeal swab specimens and should not be used as a  sole basis for treatment. Nasal washings and aspirates are unacceptable for Xpert Xpress SARS-CoV-2/FLU/RSV testing.  Fact Sheet for Patients: EntrepreneurPulse.com.au  Fact Sheet for Healthcare Providers: IncredibleEmployment.be  This test is not yet approved or cleared by the [place] FDA and has been authorized for detection and/or diagnosis of SARS-CoV-2 by FDA under an Emergency Use Authorization (EUA). This EUA will remain in effect (meaning this test can be used) for the duration of the COVID-19 declaration under Section 564(b)(1) of the Act, 21 U.S.C. section 360bbb-3(b)(1), unless the authorization is terminated or revoked.  Performed at [facility], [place] [address], [place], [place] [address]     Radiology Studies:  DG Chest 2 View  Result Date:  07/24/2021 CLINICAL DATA:  Short of breath EXAM: CHEST - 2 VIEW COMPARISON:  Prior chest x-ray 11/17/2020 FINDINGS: Stable cardiac mediastinal contours. Atherosclerotic calcifications present in the transverse aorta. No pulmonary edema, pleural effusion, pneumothorax or focal airspace infiltrate. Mild bronchitic changes are stable compared to prior. No acute osseous abnormality. IMPRESSION: Stable chest x-ray without evidence of acute cardiopulmonary process. Electronically Signed   By: Jacqulynn Cadet M.D.   On: 07/24/2021 13:04   CT HEAD WO CONTRAST (5MM)  Result Date: 07/24/2021 CLINICAL DATA:  Mental status change. EXAM: CT HEAD WITHOUT CONTRAST TECHNIQUE: Contiguous axial images were obtained from the base of the skull through the vertex without intravenous contrast. COMPARISON:  None. FINDINGS: Brain: Ventricle size and cerebral volume normal. Extensive hypodensity throughout the cerebral white matter bilaterally, symmetric. Negative for acute infarct, hemorrhage, mass. Vascular: Negative for hyperdense vessel Skull: Negative Sinuses/Orbits: Mild mucosal edema paranasal sinuses. Bilateral cataract extraction Other: None IMPRESSION: No acute abnormality. Extensive white matter hypodensity likely due to chronic microvascular ischemia. Correlate with risk factors. Electronically Signed   By: Franchot Gallo M.D.   On: 07/24/2021 17:38   CT ABDOMEN PELVIS W CONTRAST  Result Date: 07/24/2021 CLINICAL DATA:  Left lower quadrant abdominal pain. Recent diagnosis of influenza and hospitalization. EXAM: CT ABDOMEN AND PELVIS WITH CONTRAST TECHNIQUE: Multidetector CT imaging of the abdomen and pelvis was performed using the standard protocol following bolus administration of intravenous contrast. CONTRAST:  55mL OMNIPAQUE IOHEXOL 350 MG/ML SOLN COMPARISON:  CT of the abdomen pelvis 05/28/2019 FINDINGS: Lower chest: Mild dependent atelectasis or scarring is present at the left base. Lungs are otherwise clear. Heart  size is normal. No significant pleural or pericardial effusion is present. Hepatobiliary: No focal liver abnormality is seen. Status post cholecystectomy. No biliary dilatation. Pancreas: Unremarkable. No pancreatic ductal dilatation or surrounding inflammatory changes. Spleen: Normal in size without focal abnormality. Adrenals/Urinary Tract: Adrenal glands are within normal limits bilaterally. Bilateral renal cysts are present. The largest is a central sinus cyst of the left kidney similar the prior exam. The cyst measures 4 cm. Additional bilateral cysts are present. No definite stone or solid mass lesion scratched at no definite stone is present. No solid mass lesion is present. No obstruction is present. Ureters within normal limits bilaterally. A Foley catheter is present within the urinary bladder. The bladder is collapsed. Stomach/Bowel: A small hiatal hernia is again seen. Previously noted gastric wall thickening is not present on today's study. A duodenal ulcer present without inflammation. Duodenum is otherwise unremarkable. Small bowel is within normal limits. The stomach and duodenum are within normal limits. Small bowel is unremarkable. The terminal ileum is within normal limits. The appendix is visualized and normal. The ascending and transverse colon are within normal limits. The distal transverse  colon, descending colon, and sigmoid colon are collapsed. No inflammatory changes are present. Vascular/Lymphatic: Atherosclerotic changes are present in the aorta and branch vessels. No aneurysm is present. No significant adenopathy is present. Reproductive: Status post hysterectomy. No adnexal masses. Other: No abdominal wall hernia or abnormality. No abdominopelvic ascites. Musculoskeletal: Multilevel degenerative facet changes are noted in the lower lumbar spine. No focal osseous lesions are present. Posterior facets are fused at L5-S1. Bony pelvis is within normal limits. The hips are located and within  normal limits. IMPRESSION: 1. No acute or focal lesion to explain the patient's left lower quadrant abdominal pain. The descending and sigmoid colon are collapsed without evidence for inflammation. 2. Bilateral renal cysts. 3. Foley catheter within the urinary bladder. 4. Cholecystectomy and hysterectomy. 5. Aortic Atherosclerosis (ICD10-I70.0). Electronically Signed   By: San Morelle M.D.   On: 07/24/2021 17:43    Scheduled Meds:    apixaban  5 mg Oral BID   ipratropium-albuterol  3 mL Nebulization Once   phenazopyridine  100 mg Oral TID WC    Continuous Infusions:    sodium chloride 50 mL/hr at 07/25/21 0559   ciprofloxacin 200 mg (07/25/21 0833)     LOS: 0 days     Vernell Leep, MD,  FACP, [facility], [facility], [facility] (Care Management Physician Certified) [place]  To contact the attending provider between 7A-7P or the covering provider during after hours 7P-7A, please log into the web site [email or web address] and access using universal [place] password for that web site. If you do not have the password, please call the hospital operator.  07/25/2021, 8:58 AM

## 2021-07-25 NOTE — ED Notes (Signed)
This RN opened vial of Fentanyl but the vial broke and all of the medication spilled out. 50 mcg (75ml) of Fentanyl wasted with Junior, RN

## 2021-07-25 NOTE — ED Notes (Signed)
Transport at bedside  

## 2021-07-25 NOTE — ED Notes (Signed)
37.5 mcg (0.45ml) of Fentanyl wasted with Bobbye Charleston, RN. Pyxis not functioning properly to waste in pyxis.

## 2021-07-26 LAB — C DIFFICILE QUICK SCREEN W PCR REFLEX
C Diff antigen: NEGATIVE
C Diff interpretation: NOT DETECTED
C Diff toxin: NEGATIVE

## 2021-07-26 LAB — CBC
HCT: 37.8 % (ref 36.0–46.0)
Hemoglobin: 13 g/dL (ref 12.0–15.0)
MCH: 30.4 pg (ref 26.0–34.0)
MCHC: 34.4 g/dL (ref 30.0–36.0)
MCV: 88.5 fL (ref 80.0–100.0)
Platelets: 236 10*3/uL (ref 150–400)
RBC: 4.27 MIL/uL (ref 3.87–5.11)
RDW: 12.5 % (ref 11.5–15.5)
WBC: 8.1 10*3/uL (ref 4.0–10.5)
nRBC: 0 % (ref 0.0–0.2)

## 2021-07-26 LAB — BASIC METABOLIC PANEL
Anion gap: 7 (ref 5–15)
BUN: 9 mg/dL (ref 8–23)
CO2: 27 mmol/L (ref 22–32)
Calcium: 8.1 mg/dL — ABNORMAL LOW (ref 8.9–10.3)
Chloride: 99 mmol/L (ref 98–111)
Creatinine, Ser: 0.8 mg/dL (ref 0.44–1.00)
GFR, Estimated: >60 mL/min (ref >60)
Glucose, Bld: 102 mg/dL — ABNORMAL HIGH (ref 70–99)
Potassium: 3.7 mmol/L (ref 3.5–5.1)
Sodium: 133 mmol/L — ABNORMAL LOW (ref 135–145)

## 2021-07-26 LAB — URINE CULTURE: Culture: NO GROWTH

## 2021-07-26 MED ORDER — TRAZODONE HCL 50 MG PO TABS: 25.0000 mg | ORAL_TABLET | Freq: Every evening | ORAL | Status: AC | PRN

## 2021-07-26 NOTE — Progress Notes (Signed)
MD Hongalgi notified for foley orders.

## 2021-07-26 NOTE — Progress Notes (Signed)
Foley removed. Pt tolerated well. Pericare done Pt is aware she is due to void. BSC in reach. Pt states she will call for assistance. Will monitor.

## 2021-07-26 NOTE — Discharge Summary (Signed)
Physician Discharge Summary  NADIE FIUMARA PPI:[id number] DOB: 15-Mar-1941  PCP: [name], MD  Admitted from: Home Discharged to: Home  Admit date: 07/24/2021 Discharge date: 07/26/2021  Recommendations for Outpatient Follow-up:    Follow-up Information     [name], MD. Schedule an appointment as soon as possible for a visit.   Specialty: Family Medicine Why: To be seen in 5 to 7 days with repeat labs (CBC & BMP). Contact information: [place] [address] [phone]         [name], [name], MD .   Specialty: Cardiology Contact information: [address] [place] [address] [phone]                  Home Health: None    Equipment/Devices: None    Discharge Condition: Improved and stable.   Code Status: Full Code Diet recommendation:  Discharge Diet Orders (From admission, onward)     Start     Ordered   07/26/21 0000  Diet general        07/26/21 1415             Discharge Diagnoses:  Principal Problem:   Hyponatremia Active Problems:   COPD (chronic obstructive pulmonary disease) (HCC)   Paroxysmal atrial fibrillation (HCC)   Hypokalemia   Acute lower UTI   Hypocalcemia   Dehydration with hyponatremia   Brief Summary: 81 year old married female, independent, medical history significant for remote history of breast cancer s/p right lumpectomy on surveillance, COPD, paroxysmal A. fib on Eliquis, recent hospitalization 12/29-12/30 for acute hypoxia secondary to COPD exacerbation with influenza and also found to be hyponatremic at that time with sodium of 122, discharged home on prednisone and doxycycline, presented to the ED with complaints of overall feeling poorly since discharge, lower abdominal/suprapubic pain, urinary urgency, hematuria, acute urinary retention, diarrhea with poor oral intake.  Admitted for dehydration with hyponatremia, electrolyte abnormalities, acute urinary  retention with suspected acute cystitis, diarrhea and abdominal pain.   Assessment & Plan:   Dehydration with hyponatremia: Due to decreased oral intake and GI losses/diarrhea Admission serum sodium 120.  This was 130 upon recent discharge 12/30. Lab work on recent admission: TSH normal at 1.056.  Urine osmolarity: 163, serum osmolarity 279 and urine sodium 14.  Urine sodium <10 on 1/3. Urine osmolarity: 78, urine sodium <10. Treated initially with IV saline bolus followed by gentle IV saline infusion with very close monitoring of frequent BMPs to avoid rapid correction of hyponatremia.  Over the next approximately 24 hours, serum sodium was corrected from 120 on admission to 130.  Fluids were changed to half-normal saline.  Follow-up serum sodium this morning is 133.  Dehydration has resolved.  Encouraged patient regarding ad lib. oral hydration and liberal salt intake for couple days.  Follow-up repeat BMP next week as outpatient.    Hypokalemia: Replaced.  Magnesium 1.9.  Hypocalcemia Replaced and improved.  Asymptomatic.  Acute urinary retention Had UTI symptoms PTA. However urine microscopy only positive for nitrites but no bacteria. Foley catheter inserted on ED arrival but unclear how much urine was drained.  As per patient's daughter who is an RN's report, did have a bladder scan prior to Foley catheter placement in ED and reportedly 1.4 L was drained.   Was empirically on IV Cipro pending cultures.  Reported penicillin allergy.  Urine culture today returned negative.  Cipro discontinued. No bladder pains reported today. Foley catheter discontinued this morning  and patient has voided without difficulty.  Has had no further gross hematuria. Discussed with patient and daughter at bedside in detail that if she has recurrent urinary symptoms including urgency, dysuria or retention, recommend outpatient urology consultation.  They verbalized understanding.  Diarrhea/abdominal  pain Unclear if infectious versus noninfectious.  Possibly acute viral GE. CT abdomen with contrast: No acute or focal lesion to explain patient's pain. C. difficile testing negative.  GI panel PCR is pending and can be followed as outpatient. Resolved.  Having normal BM and tolerating diet without abdominal pain. Patient reports that she had a colonoscopy about 2 years ago and apart from a polyp was said to be unremarkable.  Paroxysmal A. fib Telemetry initially on arrival had some atrial flutter with controlled ventricular rate but since then sinus rhythm. Continue apixaban and metoprolol.  COPD without exacerbation  Recent influenza A Positive on 12/29, not treated with Tamiflu last admission since she was outside the window.  Currently without symptoms.   Body mass index is 27.4 kg/m.       Consultants:   None   Procedures:   None   Discharge Instructions  Discharge Instructions     Call MD for:   Complete by: As directed    Recurrent confusion or mental status changes.  Recurrent difficulty urinating.   Call MD for:  difficulty breathing, headache or visual disturbances   Complete by: As directed    Call MD for:  extreme fatigue   Complete by: As directed    Call MD for:  persistant dizziness or light-headedness   Complete by: As directed    Call MD for:  persistant nausea and vomiting   Complete by: As directed    Call MD for:  severe uncontrolled pain   Complete by: As directed    Call MD for:  temperature >100.4   Complete by: As directed    Diet general   Complete by: As directed    Increase activity slowly   Complete by: As directed         Medication List     TAKE these medications    albuterol 108 (90 Base) MCG/ACT inhaler Commonly known as: VENTOLIN HFA Inhale 2 puffs into the lungs every 6 (six) hours as needed for wheezing or shortness of breath.   cholecalciferol 1000 units tablet Commonly known as: VITAMIN D Take 1,000 Units by mouth  daily.   Eliquis 5 MG Tabs tablet Generic drug: apixaban Take 1 tablet (5 mg total) by mouth 2 (two) times daily.   LUBRICATING EYE DROPS OP Place 1 drop into both eyes daily as needed (Dry Eyes).   metoprolol tartrate 25 MG tablet Commonly known as: LOPRESSOR Take 1 tablet (25 mg total) by mouth 2 (two) times daily.   montelukast 10 MG tablet Commonly known as: SINGULAIR Take 10 mg by mouth at bedtime.   OSTEO BI-FLEX TRIPLE STRENGTH PO Take 1 tablet by mouth daily.   sertraline 50 MG tablet Commonly known as: ZOLOFT Take 50 mg by mouth daily.   traZODone 50 MG tablet Commonly known as: DESYREL Take 0.5 tablets (25 mg total) by mouth at bedtime as needed for sleep. What changed: how much to take   vitamin C 500 MG tablet Commonly known as: ASCORBIC ACID Take 500 mg by mouth daily.       Allergies  Allergen Reactions   Demerol [Meperidine] Nausea And Vomiting and Other (See Comments)    Bad headache ,  Nausea/Vomiting.  Penicillins     Has patient had a PCN reaction causing immediate rash, facial/tongue/throat swelling, SOB or lightheadedness with hypotension: YES Has patient had a PCN reaction causing severe rash involving mucus membranes or skin necrosis: NO Has patient had a PCN reaction that required hospitalization: NO Has patient had a PCN reaction occurring within the last 10 years: NO If all of the above answers are "NO", then may proceed with Cephalosporin use.    Codeine Other (See Comments)    Massive headache   Sulfa Antibiotics Itching and Rash      Procedures/Studies: DG Chest 2 View  Result Date: 07/24/2021 CLINICAL DATA:  Short of breath EXAM: CHEST - 2 VIEW COMPARISON:  Prior chest x-ray 11/17/2020 FINDINGS: Stable cardiac mediastinal contours. Atherosclerotic calcifications present in the transverse aorta. No pulmonary edema, pleural effusion, pneumothorax or focal airspace infiltrate. Mild bronchitic changes are stable compared to prior. No  acute osseous abnormality. IMPRESSION: Stable chest x-ray without evidence of acute cardiopulmonary process. Electronically Signed   By: Jacqulynn Cadet M.D.   On: 07/24/2021 13:04   CT HEAD WO CONTRAST (5MM)  Result Date: 07/24/2021 CLINICAL DATA:  Mental status change. EXAM: CT HEAD WITHOUT CONTRAST TECHNIQUE: Contiguous axial images were obtained from the base of the skull through the vertex without intravenous contrast. COMPARISON:  None. FINDINGS: Brain: Ventricle size and cerebral volume normal. Extensive hypodensity throughout the cerebral white matter bilaterally, symmetric. Negative for acute infarct, hemorrhage, mass. Vascular: Negative for hyperdense vessel Skull: Negative Sinuses/Orbits: Mild mucosal edema paranasal sinuses. Bilateral cataract extraction Other: None IMPRESSION: No acute abnormality. Extensive white matter hypodensity likely due to chronic microvascular ischemia. Correlate with risk factors. Electronically Signed   By: Franchot Gallo M.D.   On: 07/24/2021 17:38   CT ABDOMEN PELVIS W CONTRAST  Result Date: 07/24/2021 CLINICAL DATA:  Left lower quadrant abdominal pain. Recent diagnosis of influenza and hospitalization. EXAM: CT ABDOMEN AND PELVIS WITH CONTRAST TECHNIQUE: Multidetector CT imaging of the abdomen and pelvis was performed using the standard protocol following bolus administration of intravenous contrast. CONTRAST:  3mL OMNIPAQUE IOHEXOL 350 MG/ML SOLN COMPARISON:  CT of the abdomen pelvis 05/28/2019 FINDINGS: Lower chest: Mild dependent atelectasis or scarring is present at the left base. Lungs are otherwise clear. Heart size is normal. No significant pleural or pericardial effusion is present. Hepatobiliary: No focal liver abnormality is seen. Status post cholecystectomy. No biliary dilatation. Pancreas: Unremarkable. No pancreatic ductal dilatation or surrounding inflammatory changes. Spleen: Normal in size without focal abnormality. Adrenals/Urinary Tract: Adrenal  glands are within normal limits bilaterally. Bilateral renal cysts are present. The largest is a central sinus cyst of the left kidney similar the prior exam. The cyst measures 4 cm. Additional bilateral cysts are present. No definite stone or solid mass lesion scratched at no definite stone is present. No solid mass lesion is present. No obstruction is present. Ureters within normal limits bilaterally. A Foley catheter is present within the urinary bladder. The bladder is collapsed. Stomach/Bowel: A small hiatal hernia is again seen. Previously noted gastric wall thickening is not present on today's study. A duodenal ulcer present without inflammation. Duodenum is otherwise unremarkable. Small bowel is within normal limits. The stomach and duodenum are within normal limits. Small bowel is unremarkable. The terminal ileum is within normal limits. The appendix is visualized and normal. The ascending and transverse colon are within normal limits. The distal transverse colon, descending colon, and sigmoid colon are collapsed. No inflammatory changes are present. Vascular/Lymphatic: Atherosclerotic changes are  present in the aorta and branch vessels. No aneurysm is present. No significant adenopathy is present. Reproductive: Status post hysterectomy. No adnexal masses. Other: No abdominal wall hernia or abnormality. No abdominopelvic ascites. Musculoskeletal: Multilevel degenerative facet changes are noted in the lower lumbar spine. No focal osseous lesions are present. Posterior facets are fused at L5-S1. Bony pelvis is within normal limits. The hips are located and within normal limits. IMPRESSION: 1. No acute or focal lesion to explain the patient's left lower quadrant abdominal pain. The descending and sigmoid colon are collapsed without evidence for inflammation. 2. Bilateral renal cysts. 3. Foley catheter within the urinary bladder. 4. Cholecystectomy and hysterectomy. 5. Aortic Atherosclerosis (ICD10-I70.0).  Electronically Signed   By: San Morelle M.D.   On: 07/24/2021 17:43     Subjective: Patient interviewed and examined along with her daughter at bedside.  Reports feeling much better.  No longer having abdominal pain.  Feels that her abdominal pain resolved after having a BM early this morning.  Stool was formed/firm without diarrhea.  Tolerated diet without nausea, vomiting or abdominal pain.  Some intermittent mild urinary urgency but nowhere as severe as on initial presentation.  This was prior to removal of Foley catheter.  Since this morning, Foley catheter has been discontinued and she has voided without difficulty  Discharge Exam:  Vitals:   07/25/21 1543 07/25/21 2013 07/26/21 0029 07/26/21 0324  BP: (!) 146/77 132/84 (!) 144/87 (!) 147/86  Pulse: 82 98 74 77  Resp:  16 16 16   Temp: (!) 97.3 F (36.3 C) 97.8 F (36.6 C) 98 F (36.7 C) 98 F (36.7 C)  TempSrc: Oral Oral Oral Oral  SpO2: 96% 97% 94% 95%  Weight: 65.6 kg     Height: 5\' 1"  (1.549 m)       General exam: Elderly female, moderately built and nourished lying comfortably propped up in bed without distress.  Oral mucosa moist. Respiratory system: Clear to auscultation. Respiratory effort normal. Cardiovascular system: S1 & S2 heard, RRR. No JVD, murmurs, rubs, gallops or clicks. No pedal edema.   Gastrointestinal system: Abdomen is nondistended, soft and nontender.  No organomegaly or masses felt. Normal bowel sounds heard. GU: Had indwelling Foley catheter without gross hematuria. Central nervous system: Alert and oriented. No focal neurological deficits. Extremities: Symmetric 5 x 5 power. Skin: No rashes, lesions or ulcers Psychiatry: Judgement and insight appear normal. Mood & affect appropriate.     The results of significant diagnostics from this hospitalization (including imaging, microbiology, ancillary and laboratory) are listed below for reference.     Microbiology: Recent Results (from the  past 240 hour(s))  Resp Panel by RT-PCR (Flu A&B, Covid) Nasopharyngeal Swab     Status: Abnormal   Collection Time: 07/19/21  5:26 PM   Specimen: Nasopharyngeal Swab; Nasopharyngeal(NP) swabs in vial transport medium  Result Value Ref Range Status   SARS Coronavirus 2 by RT PCR NEGATIVE NEGATIVE Final    Comment: (NOTE) SARS-CoV-2 target nucleic acids are NOT DETECTED.  The SARS-CoV-2 RNA is generally detectable in upper respiratory specimens during the acute phase of infection. The lowest concentration of SARS-CoV-2 viral copies this assay can detect is 138 copies/mL. A negative result does not preclude SARS-Cov-2 infection and should not be used as the sole basis for treatment or other patient management decisions. A negative result may occur with  improper specimen collection/handling, submission of specimen other than nasopharyngeal swab, presence of viral mutation(s) within the areas targeted by this assay, and  inadequate number of viral copies(<138 copies/mL). A negative result must be combined with clinical observations, patient history, and epidemiological information. The expected result is Negative.  Fact Sheet for Patients:  EntrepreneurPulse.com.au  Fact Sheet for Healthcare Providers:  IncredibleEmployment.be  This test is no t yet approved or cleared by the [place] FDA and  has been authorized for detection and/or diagnosis of SARS-CoV-2 by FDA under an Emergency Use Authorization (EUA). This EUA will remain  in effect (meaning this test can be used) for the duration of the COVID-19 declaration under Section 564(b)(1) of the Act, 21 U.S.C.section 360bbb-3(b)(1), unless the authorization is terminated  or revoked sooner.       Influenza A by PCR POSITIVE (A) NEGATIVE Final   Influenza B by PCR NEGATIVE NEGATIVE Final    Comment: (NOTE) The Xpert Xpress SARS-CoV-2/FLU/RSV plus assay is intended as an aid in the diagnosis  of influenza from Nasopharyngeal swab specimens and should not be used as a sole basis for treatment. Nasal washings and aspirates are unacceptable for Xpert Xpress SARS-CoV-2/FLU/RSV testing.  Fact Sheet for Patients: EntrepreneurPulse.com.au  Fact Sheet for Healthcare Providers: IncredibleEmployment.be  This test is not yet approved or cleared by the [place] FDA and has been authorized for detection and/or diagnosis of SARS-CoV-2 by FDA under an Emergency Use Authorization (EUA). This EUA will remain in effect (meaning this test can be used) for the duration of the COVID-19 declaration under Section 564(b)(1) of the Act, 21 U.S.C. section 360bbb-3(b)(1), unless the authorization is terminated or revoked.  Performed at [facility], [address] [place], [place] [address]   Urine Culture     Status: None   Collection Time: 07/24/21  1:28 PM   Specimen: Urine, Clean Catch  Result Value Ref Range Status   Specimen Description   Final    URINE, CLEAN CATCH Performed at [facility], [place] [address] [place], [place] [address]    Special Requests   Final    NONE Performed at [facility], [place] [address] [place], [place] [address]    Culture   Final    NO GROWTH Performed at [facility], [place] [address] [place], [place] [address]    Report Status 07/26/2021 FINAL  Final  Resp Panel by RT-PCR (Flu A&B, Covid) Nasopharyngeal Swab     Status: Abnormal   Collection Time: 07/24/21  6:37 PM   Specimen: Nasopharyngeal Swab; Nasopharyngeal(NP) swabs in vial transport medium  Result Value Ref Range Status   SARS Coronavirus 2 by RT PCR NEGATIVE NEGATIVE Final    Comment: (NOTE) SARS-CoV-2 target nucleic acids are NOT DETECTED.  The SARS-CoV-2 RNA is generally detectable in upper respiratory specimens during the acute phase of infection. The lowest concentration of SARS-CoV-2 viral  copies this assay can detect is 138 copies/mL. A negative result does not preclude SARS-Cov-2 infection and should not be used as the sole basis for treatment or other patient management decisions. A negative result may occur with  improper specimen collection/handling, submission of specimen other than nasopharyngeal swab, presence of viral mutation(s) within the areas targeted by this assay, and inadequate number of viral copies(<138 copies/mL). A negative result must be combined with clinical observations, patient history, and epidemiological information. The expected result is Negative.  Fact Sheet for Patients:  EntrepreneurPulse.com.au  Fact Sheet for Healthcare Providers:  IncredibleEmployment.be  This test is no t yet approved or cleared by the [place] and  has been authorized for detection and/or diagnosis of SARS-CoV-2 by FDA under an Emergency Use Authorization (EUA). This EUA will remain  in effect (meaning this test can be used) for the duration of the COVID-19 declaration under Section 564(b)(1) of the Act, 21 U.S.C.section 360bbb-3(b)(1), unless the authorization is terminated  or revoked sooner.       Influenza A by PCR POSITIVE (A) NEGATIVE Final   Influenza B by PCR NEGATIVE NEGATIVE Final    Comment: (NOTE) The Xpert Xpress SARS-CoV-2/FLU/RSV plus assay is intended as an aid in the diagnosis of influenza from Nasopharyngeal swab specimens and should not be used as a sole basis for treatment. Nasal washings and aspirates are unacceptable for Xpert Xpress SARS-CoV-2/FLU/RSV testing.  Fact Sheet for Patients: EntrepreneurPulse.com.au  Fact Sheet for Healthcare Providers: IncredibleEmployment.be  This test is not yet approved or cleared by the [place] FDA and has been authorized for detection and/or diagnosis of SARS-CoV-2 by FDA under an Emergency Use Authorization (EUA).  This EUA will remain in effect (meaning this test can be used) for the duration of the COVID-19 declaration under Section 564(b)(1) of the Act, 21 U.S.C. section 360bbb-3(b)(1), unless the authorization is terminated or revoked.  Performed at [facility], [place] [address] [place], [place] [address]   C Difficile Quick Screen w PCR reflex     Status: None   Collection Time: 07/26/21  8:21 AM   Specimen: STOOL  Result Value Ref Range Status   C Diff antigen NEGATIVE NEGATIVE Final   C Diff toxin NEGATIVE NEGATIVE Final   C Diff interpretation No C. difficile detected.  Final    Comment: Performed at [facility], [address] [place], [place] [address]     Labs: CBC: Recent Labs  Lab 07/19/21 1755 07/20/21 0053 07/24/21 1328 07/26/21 0430  WBC 6.4 5.6 13.2* 8.1  NEUTROABS 5.0 4.7 9.4*  --   HGB 13.6 13.4 15.1* 13.0  HCT 39.1 39.1 43.6 37.8  MCV 86.9 88.7 86.2 88.5  PLT 170 155 205 888    Basic Metabolic Panel: Recent Labs  Lab 07/20/21 0053 07/24/21 1328 07/25/21 0026 07/25/21 0430 07/25/21 0800 07/25/21 1346 07/26/21 0430  NA 130* 120* 121* 125* 128* 130* 133*  K 3.8 2.8* 3.6 3.3* 3.3* 3.8 3.7  CL 97* 86* 89* 91* 95* 100 99  CO2 23 24 24 25 25 22 27   GLUCOSE 147* 121* 114* 111* 107* 99 102*  BUN 8 13 10 9  7* 8 9  CREATININE 0.61 0.80 0.65 0.57 0.66 0.57 0.80  CALCIUM 8.4* 8.4* 7.9* 8.8* 8.4* 8.2* 8.1*  MG 2.0 1.9  --   --   --   --   --   PHOS 3.0  --   --   --   --   --   --     Liver Function Tests: Recent Labs  Lab 07/20/21 0053 07/24/21 1328  AST 26 21  ALT 24 24  ALKPHOS 70 69  BILITOT 0.8 1.4*  PROT 7.5 7.5  ALBUMIN 3.6 3.9     Urinalysis    Component Value Date/Time   COLORURINE AMBER (A) 07/24/2021 1328   APPEARANCEUR CLEAR 07/24/2021 1328   LABSPEC 1.001 (L) 07/24/2021 1328   PHURINE 6.0 07/24/2021 1328   GLUCOSEU NEGATIVE 07/24/2021 1328   HGBUR NEGATIVE 07/24/2021 1328   BILIRUBINUR  NEGATIVE 07/24/2021 1328   KETONESUR NEGATIVE 07/24/2021 1328   PROTEINUR NEGATIVE 07/24/2021 1328   NITRITE POSITIVE (  A) 07/24/2021 1328   LEUKOCYTESUR NEGATIVE 07/24/2021 1328    Discussed in detail with patient's daughter at bedside, updated care and answered all questions.  She is an Therapist, sports.  Time coordinating discharge: 25 minutes  SIGNED:  Vernell Leep, MD,  FACP, [facility], [facility], [facility] (Care Management Physician Certified). [facility]  To contact the attending provider between 7A-7P or the covering provider during after hours 7P-7A, please log into the web site [email or web address] and access using universal [place] password for that web site. If you do not have the password, please call the hospital operator.

## 2021-07-26 NOTE — Discharge Instructions (Signed)

## 2021-07-27 LAB — GASTROINTESTINAL PANEL BY PCR, STOOL (REPLACES STOOL CULTURE)

## 2021-08-14 ENCOUNTER — Encounter (INDEPENDENT_AMBULATORY_CARE_PROVIDER_SITE_OTHER): Payer: Self-pay | Admitting: Ophthalmology

## 2021-08-14 ENCOUNTER — Other Ambulatory Visit: Payer: Self-pay

## 2021-08-14 ENCOUNTER — Ambulatory Visit (INDEPENDENT_AMBULATORY_CARE_PROVIDER_SITE_OTHER): Payer: Medicare PPO | Admitting: Ophthalmology

## 2021-08-14 DIAGNOSIS — H353211 Exudative age-related macular degeneration, right eye, with active choroidal neovascularization: Secondary | ICD-10-CM | POA: Diagnosis not present

## 2021-08-14 DIAGNOSIS — H353122 Nonexudative age-related macular degeneration, left eye, intermediate dry stage: Secondary | ICD-10-CM

## 2021-08-14 MED ORDER — BEVACIZUMAB 2.5 MG/0.1ML IZ SOSY
2.5000 mg | PREFILLED_SYRINGE | INTRAVITREAL | Status: AC | PRN
  Administered 2021-08-14: 10:00:00 2.5 mg via INTRAVITREAL

## 2021-08-14 NOTE — Assessment & Plan Note (Signed)
No sign of CNVM 

## 2021-08-14 NOTE — Assessment & Plan Note (Signed)
Improved findings today with no subretinal fluid in the FAZ region of the right eye.  Preserved acuity will repeat injection today Avastin at 5-week interval maintain 5-week interval

## 2021-08-14 NOTE — Progress Notes (Signed)
08/14/2021     CHIEF COMPLAINT Patient presents for  Chief Complaint  Patient presents with   Retina Follow Up      HISTORY OF PRESENT ILLNESS: Michelle Gonzales is a 80 y.o. female who presents to the clinic today for:   HPI     Retina Follow Up           Diagnosis: Wet AMD   Laterality: right eye   Onset: 5 weeks ago   Duration: 5 weeks   Course: stable         Comments   5 week fu Dilate OD Avastin OCT. Patient states vision is stable and unchanged since last visit. Denies any new floaters or FOL.       Last edited by Laurin Coder on 08/14/2021  9:21 AM.      Referring physician: [name], MD [address] [place],  [place] [address]  HISTORICAL INFORMATION:   Selected notes from the MEDICAL RECORD NUMBER    Lab Results  Component Value Date   HGBA1C 5.7 (H) 09/21/2017     CURRENT MEDICATIONS: Current Outpatient Medications (Ophthalmic Drugs)  Medication Sig   Carboxymethylcellul-Glycerin (LUBRICATING EYE DROPS OP) Place 1 drop into both eyes daily as needed (Dry Eyes).   No current facility-administered medications for this visit. (Ophthalmic Drugs)   Current Outpatient Medications (Other)  Medication Sig   albuterol (VENTOLIN HFA) 108 (90 Base) MCG/ACT inhaler Inhale 2 puffs into the lungs every 6 (six) hours as needed for wheezing or shortness of breath.   apixaban (ELIQUIS) 5 MG TABS tablet Take 1 tablet (5 mg total) by mouth 2 (two) times daily.   cholecalciferol (VITAMIN D) 1000 units tablet Take 1,000 Units by mouth daily.    metoprolol tartrate (LOPRESSOR) 25 MG tablet Take 1 tablet (25 mg total) by mouth 2 (two) times daily.   Misc Natural Products (OSTEO BI-FLEX TRIPLE STRENGTH PO) Take 1 tablet by mouth daily.   montelukast (SINGULAIR) 10 MG tablet Take 10 mg by mouth at bedtime.    sertraline (ZOLOFT) 50 MG tablet Take 50 mg by mouth daily.   traZODone (DESYREL) 50 MG tablet Take 0.5 tablets (25 mg total) by  mouth at bedtime as needed for sleep.   vitamin C (ASCORBIC ACID) 500 MG tablet Take 500 mg by mouth daily.    No current facility-administered medications for this visit. (Other)      REVIEW OF SYSTEMS:    ALLERGIES Allergies  Allergen Reactions   Demerol [Meperidine] Nausea And Vomiting and Other (See Comments)    Bad headache ,  Nausea/Vomiting.   Penicillins     Has patient had a PCN reaction causing immediate rash, facial/tongue/throat swelling, SOB or lightheadedness with hypotension: YES Has patient had a PCN reaction causing severe rash involving mucus membranes or skin necrosis: NO Has patient had a PCN reaction that required hospitalization: NO Has patient had a PCN reaction occurring within the last 10 years: NO If all of the above answers are "NO", then may proceed with Cephalosporin use.    Codeine Other (See Comments)    Massive headache   Sulfa Antibiotics Itching and Rash    PAST MEDICAL HISTORY Past Medical History:  Diagnosis Date   Allergy    Pcn, Sulfa, Codeine   Anxiety    Arthritis    Atrial fibrillation ([place]) 09/22/2017   Cataract    surgey b/l   COPD (chronic obstructive pulmonary disease) ([place])  Cough    Depression    hx years ago   Hordeolum internum right lower eyelid 05/11/2020   Hot flashes    Shortness of breath    Sjogren's disease ([place])    Past Surgical History:  Procedure Laterality Date   ABDOMINAL HYSTERECTOMY  73   partial   BREAST LUMPECTOMY WITH NEEDLE LOCALIZATION AND AXILLARY SENTINEL LYMPH NODE BX Right 02/01/2013   Procedure: BREAST LUMPECTOMY WITH NEEDLE LOCALIZATION AND AXILLARY SENTINEL LYMPH NODE BX;  Surgeon: Odis Hollingshead, MD;  Location: [facility];  Service: General;  Laterality: Right;   BREAST SURGERY Right 1965   biopsy   CERVICAL FUSION  2010   CHOLECYSTECTOMY  2002   COLONOSCOPY     DILATION AND CURETTAGE OF UTERUS     LEFT HEART CATH AND CORONARY ANGIOGRAPHY N/A 09/24/2017    Procedure: LEFT HEART CATH AND CORONARY ANGIOGRAPHY;  Surgeon: Leonie Man, MD;  Location: [facility];  Service: Cardiovascular;  Laterality: N/A;   ULTRASOUND GUIDANCE FOR VASCULAR ACCESS Right 09/24/2017   Procedure: Ultrasound Guidance For Vascular Access;  Surgeon: Leonie Man, MD;  Location: [facility];  Service: Cardiovascular;  Laterality: Right;    FAMILY HISTORY Family History  Problem Relation Age of Onset   Breast cancer Mother     SOCIAL HISTORY Social History   Tobacco Use   Smoking status: Every Day    Packs/day: 0.50    Types: Cigarettes    Last attempt to quit: 10/07/2017    Years since quitting: 3.8   Smokeless tobacco: Never  Vaping Use   Vaping Use: Never used  Substance Use Topics   Alcohol use: Not Currently   Drug use: No         OPHTHALMIC EXAM:  Base Eye Exam     Visual Acuity (ETDRS)       Right Left   Dist [place] 20/50 -1 20/40   Dist ph [place] NI 20/30 -1         Tonometry (Tonopen, 9:26 AM)       Right Left   Pressure 10 11         Pupils       Pupils Dark Light React APD   Right PERRL 3 3 Minimal None   Left PERRL 3 3 Minimal None         Visual Fields       Left Right    Full Full         Extraocular Movement       Right Left    Full Full         Neuro/Psych     Oriented x3: Yes   Mood/Affect: Normal         Dilation     Right eye: 1.0% Mydriacyl, 2.5% Phenylephrine @ 9:26 AM           Slit Lamp and Fundus Exam     External Exam       Right Left   External Normal Normal         Slit Lamp Exam       Right Left   Lids/Lashes Internal hordeolum - lower lid, resolved, inspissated oral glands, meibomitis, rosacea blepharitis stable Normal   Conjunctiva/Sclera White and quiet White and quiet   Cornea Clear Clear   Anterior Chamber Deep and quiet Deep and quiet   Iris Round and reactive Round and reactive   Lens Posterior chamber intraocular  lens Posterior chamber  intraocular lens   Anterior Vitreous Normal Normal         Fundus Exam       Right Left   Posterior Vitreous Posterior vitreous detachment, Central vitreous floaters    Disc Normal    C/D Ratio 0.4    Macula Subretinal neovascular membrane improved, Retinal pigment epithelial mottling, stable Macular thickening, RPE detachment, no retinal hemorrhage    Vessels Normal    Periphery Normal             IMAGING AND PROCEDURES  Imaging and Procedures for 08/14/21  Intravitreal Injection, Pharmacologic Agent - OD - Right Eye       Time Out 08/14/2021. 10:17 AM. Confirmed correct patient, procedure, site, and patient consented.   Anesthesia Topical anesthesia was used. Anesthetic medications included Lidocaine 4%.   Procedure Preparation included Tobramycin 0.3%, 10% betadine to eyelids, 5% betadine to ocular surface. A 30 gauge needle was used.   Injection: 2.5 mg bevacizumab 2.5 MG/0.1ML   Route: Intravitreal, Site: Right Eye   NDC: [phone], Lot: [id number]   Post-op Post injection exam found visual acuity of at least counting fingers. The patient tolerated the procedure well. There were no complications. The patient received written and verbal post procedure care education. Post injection medications included ocuflox.      OCT, Retina - OU - Both Eyes       Right Eye Quality was good. Scan locations included subfoveal. Central Foveal Thickness: 275. Progression has improved. Findings include subretinal fluid, pigment epithelial detachment, abnormal foveal contour.   Left Eye Quality was good. Scan locations included subfoveal. Central Foveal Thickness: 237. Progression has been stable. Findings include abnormal foveal contour, no IRF, no SRF, retinal drusen .   Notes Persistent subretinal fluid yet not in the FAZ but inferior, with subfoveal PED in the [place] region, repeat injection again today at 5-week follow-up OD, will repeat injection Avastin today and maintain  interval examination next to 5 weeks right eye  OS no active disease               ASSESSMENT/PLAN:  Exudative age-related macular degeneration of right eye with active choroidal neovascularization ([place]) Improved findings today with no subretinal fluid in the FAZ region of the right eye.  Preserved acuity will repeat injection today Avastin at 5-week interval maintain 5-week interval  Intermediate stage nonexudative age-related macular degeneration of left eye No sign of CNVM     ICD-10-CM   1. Exudative age-related macular degeneration of right eye with active choroidal neovascularization (HCC)  H35.3211 Intravitreal Injection, Pharmacologic Agent - OD - Right Eye    bevacizumab (AVASTIN) SOSY 2.5 mg    OCT, Retina - OU - Both Eyes    CANCELED: Color Fundus Photography Optos - OU - Both Eyes    2. Intermediate stage nonexudative age-related macular degeneration of left eye  H35.3122       1.  OD vastly improved today at 5-week interval post Avastin with no subretinal fluid in the foveal vascular region.  Submacular PED vascularized inferiorly persistent with subretinal fluid.  We will repeat injection today to control  2.  3.  Ophthalmic Meds Ordered this visit:  Meds ordered this encounter  Medications   bevacizumab (AVASTIN) SOSY 2.5 mg       Return in about 5 weeks (around 09/18/2021) for dilate, OD, AVASTIN OCT.  There are no Patient Instructions on file for this visit.   Explained the diagnoses, plan, and  follow up with the patient and they expressed understanding.  Patient expressed understanding of the importance of proper follow up care.   Clent Demark Farheen Pfahler M.D. Diseases & Surgery of the Retina and Vitreous Retina & Diabetic [place] 08/14/21     Abbreviations: M myopia (nearsighted); A astigmatism; H hyperopia (farsighted); P presbyopia; Mrx spectacle prescription;  CTL contact lenses; OD right eye; OS left eye; OU both eyes  XT exotropia; ET  esotropia; PEK punctate epithelial keratitis; PEE punctate epithelial erosions; DES dry eye syndrome; MGD meibomian gland dysfunction; ATs artificial tears; PFAT's preservative free artificial tears; [place] nuclear sclerotic cataract; PSC posterior subcapsular cataract; ERM epi-retinal membrane; PVD posterior vitreous detachment; RD retinal detachment; DM diabetes mellitus; DR diabetic retinopathy; NPDR non-proliferative diabetic retinopathy; PDR proliferative diabetic retinopathy; CSME clinically significant macular edema; DME diabetic macular edema; dbh dot blot hemorrhages; CWS cotton wool spot; POAG primary open angle glaucoma; C/D cup-to-disc ratio; HVF humphrey visual field; GVF goldmann visual field; OCT optical coherence tomography; IOP intraocular pressure; BRVO Branch retinal vein occlusion; CRVO central retinal vein occlusion; CRAO central retinal artery occlusion; BRAO branch retinal artery occlusion; RT retinal tear; SB scleral buckle; PPV pars plana vitrectomy; VH Vitreous hemorrhage; PRP panretinal laser photocoagulation; IVK intravitreal kenalog; VMT vitreomacular traction; MH Macular hole;  NVD neovascularization of the disc; NVE neovascularization elsewhere; AREDS age related eye disease study; ARMD age related macular degeneration; POAG primary open angle glaucoma; EBMD epithelial/anterior basement membrane dystrophy; ACIOL anterior chamber intraocular lens; IOL intraocular lens; PCIOL posterior chamber intraocular lens; Phaco/IOL phacoemulsification with intraocular lens placement; [place] photorefractive keratectomy; LASIK laser assisted in situ keratomileusis; HTN hypertension; DM diabetes mellitus; COPD chronic obstructive pulmonary disease

## 2021-09-19 ENCOUNTER — Encounter (INDEPENDENT_AMBULATORY_CARE_PROVIDER_SITE_OTHER): Payer: Medicare PPO | Admitting: Ophthalmology

## 2021-09-26 ENCOUNTER — Other Ambulatory Visit: Payer: Self-pay

## 2021-09-26 ENCOUNTER — Encounter (INDEPENDENT_AMBULATORY_CARE_PROVIDER_SITE_OTHER): Payer: Self-pay | Admitting: Ophthalmology

## 2021-09-26 ENCOUNTER — Ambulatory Visit (INDEPENDENT_AMBULATORY_CARE_PROVIDER_SITE_OTHER): Payer: Medicare PPO | Admitting: Ophthalmology

## 2021-09-26 DIAGNOSIS — H353122 Nonexudative age-related macular degeneration, left eye, intermediate dry stage: Secondary | ICD-10-CM

## 2021-09-26 DIAGNOSIS — H353211 Exudative age-related macular degeneration, right eye, with active choroidal neovascularization: Secondary | ICD-10-CM

## 2021-09-26 MED ORDER — BEVACIZUMAB 2.5 MG/0.1ML IZ SOSY
2.5000 mg | PREFILLED_SYRINGE | INTRAVITREAL | Status: AC | PRN
  Administered 2021-09-26: 2.5 mg via INTRAVITREAL

## 2021-09-26 NOTE — Assessment & Plan Note (Signed)
No sign of CNVM OS 

## 2021-09-26 NOTE — Progress Notes (Signed)
09/26/2021     CHIEF COMPLAINT Patient presents for  Chief Complaint  Patient presents with   Macular Degeneration      HISTORY OF PRESENT ILLNESS: Michelle Gonzales is a 81 y.o. female who presents to the clinic today for:   HPI   6 weeks status post most recent evaluation and injection of antivegF medication, OD for wet AMD.    No interval change in medical history Last edited by Hurman Horn, MD on 09/26/2021 10:59 AM.      Referring physician: [name], MD [place] [place],  [place] [address]  HISTORICAL INFORMATION:   Selected notes from the MEDICAL RECORD NUMBER    Lab Results  Component Value Date   HGBA1C 5.7 (H) 09/21/2017     CURRENT MEDICATIONS: Current Outpatient Medications (Ophthalmic Drugs)  Medication Sig   Carboxymethylcellul-Glycerin (LUBRICATING EYE DROPS OP) Place 1 drop into both eyes daily as needed (Dry Eyes).   No current facility-administered medications for this visit. (Ophthalmic Drugs)   Current Outpatient Medications (Other)  Medication Sig   albuterol (VENTOLIN HFA) 108 (90 Base) MCG/ACT inhaler Inhale 2 puffs into the lungs every 6 (six) hours as needed for wheezing or shortness of breath.   apixaban (ELIQUIS) 5 MG TABS tablet Take 1 tablet (5 mg total) by mouth 2 (two) times daily.   cholecalciferol (VITAMIN D) 1000 units tablet Take 1,000 Units by mouth daily.    metoprolol tartrate (LOPRESSOR) 25 MG tablet Take 1 tablet (25 mg total) by mouth 2 (two) times daily.   Misc Natural Products (OSTEO BI-FLEX TRIPLE STRENGTH PO) Take 1 tablet by mouth daily.   montelukast (SINGULAIR) 10 MG tablet Take 10 mg by mouth at bedtime.    sertraline (ZOLOFT) 50 MG tablet Take 50 mg by mouth daily.   traZODone (DESYREL) 50 MG tablet Take 0.5 tablets (25 mg total) by mouth at bedtime as needed for sleep.   vitamin C (ASCORBIC ACID) 500 MG tablet Take 500 mg by mouth daily.    No current facility-administered medications  for this visit. (Other)      REVIEW OF SYSTEMS: ROS   Negative for: Constitutional, Gastrointestinal, Neurological, Skin, Genitourinary, Musculoskeletal, HENT, Endocrine, Cardiovascular, Eyes, Respiratory, Psychiatric, Allergic/Imm, Heme/Lymph Last edited by Hurman Horn, MD on 09/26/2021 10:59 AM.       ALLERGIES Allergies  Allergen Reactions   Demerol [Meperidine] Nausea And Vomiting and Other (See Comments)    Bad headache ,  Nausea/Vomiting.   Penicillins     Has patient had a PCN reaction causing immediate rash, facial/tongue/throat swelling, SOB or lightheadedness with hypotension: YES Has patient had a PCN reaction causing severe rash involving mucus membranes or skin necrosis: NO Has patient had a PCN reaction that required hospitalization: NO Has patient had a PCN reaction occurring within the last 10 years: NO If all of the above answers are "NO", then may proceed with Cephalosporin use.    Codeine Other (See Comments)    Massive headache   Sulfa Antibiotics Itching and Rash    PAST MEDICAL HISTORY Past Medical History:  Diagnosis Date   Allergy    Pcn, Sulfa, Codeine   Anxiety    Arthritis    Atrial fibrillation ([place]) 09/22/2017   Cataract    surgey b/l   COPD (chronic obstructive pulmonary disease) (HCC)    Cough    Depression    hx years ago   Hordeolum internum right lower eyelid 05/11/2020  Hot flashes    Shortness of breath    Sjogren's disease ([place])    Past Surgical History:  Procedure Laterality Date   ABDOMINAL HYSTERECTOMY  74   partial   BREAST LUMPECTOMY WITH NEEDLE LOCALIZATION AND AXILLARY SENTINEL LYMPH NODE BX Right 02/01/2013   Procedure: BREAST LUMPECTOMY WITH NEEDLE LOCALIZATION AND AXILLARY SENTINEL LYMPH NODE BX;  Surgeon: Odis Hollingshead, MD;  Location: [facility];  Service: General;  Laterality: Right;   BREAST SURGERY Right 1965   biopsy   CERVICAL FUSION  2010   CHOLECYSTECTOMY  2002   COLONOSCOPY      DILATION AND CURETTAGE OF UTERUS     LEFT HEART CATH AND CORONARY ANGIOGRAPHY N/A 09/24/2017   Procedure: LEFT HEART CATH AND CORONARY ANGIOGRAPHY;  Surgeon: Leonie Man, MD;  Location: [facility];  Service: Cardiovascular;  Laterality: N/A;   ULTRASOUND GUIDANCE FOR VASCULAR ACCESS Right 09/24/2017   Procedure: Ultrasound Guidance For Vascular Access;  Surgeon: Leonie Man, MD;  Location: [facility];  Service: Cardiovascular;  Laterality: Right;    FAMILY HISTORY Family History  Problem Relation Age of Onset   Breast cancer Mother     SOCIAL HISTORY Social History   Tobacco Use   Smoking status: Every Day    Packs/day: 0.50    Types: Cigarettes    Last attempt to quit: 10/07/2017    Years since quitting: 3.9   Smokeless tobacco: Never  Vaping Use   Vaping Use: Never used  Substance Use Topics   Alcohol use: Not Currently   Drug use: No         OPHTHALMIC EXAM:  Base Eye Exam     Visual Acuity (ETDRS)       Right Left   Dist [place] 20/50 +2 20/50 -2         Tonometry (Tonopen, 11:01 AM)       Right Left   Pressure 8 9         Pupils       Pupils APD   Right PERRL None   Left PERRL None         Visual Fields       Left Right    Full Full         Extraocular Movement       Right Left    Full, Ortho Full, Ortho         Neuro/Psych     Oriented x3: Yes   Mood/Affect: Normal         Dilation     Both eyes: 1.0% Mydriacyl, 2.5% Phenylephrine @ 11:00 AM           Slit Lamp and Fundus Exam     External Exam       Right Left   External Normal Normal         Slit Lamp Exam       Right Left   Lids/Lashes Internal hordeolum - lower lid, resolved, inspissated oral glands, meibomitis, rosacea blepharitis stable Normal   Conjunctiva/Sclera White and quiet White and quiet   Cornea Clear Clear   Anterior Chamber Deep and quiet Deep and quiet   Iris Round and reactive Round and reactive   Lens Posterior  chamber intraocular lens Posterior chamber intraocular lens   Anterior Vitreous Normal Normal         Fundus Exam       Right Left   Posterior Vitreous Posterior  vitreous detachment, Central vitreous floaters Posterior vitreous detachment, Central vitreous floaters   Disc Normal Normal   C/D Ratio 0.4 0.5   Macula Subretinal neovascular membrane improved, Retinal pigment epithelial mottling, stable Macular thickening, RPE detachment, no retinal hemorrhage Intermediate age related macular degeneration, Hard drusen, no macular thickening, no membrane   Vessels Normal Normal   Periphery Normal Normal            IMAGING AND PROCEDURES  Imaging and Procedures for 09/26/21  OCT, Retina - OU - Both Eyes       Right Eye Quality was good. Scan locations included subfoveal. Central Foveal Thickness: 260. Progression has improved. Findings include subretinal fluid, pigment epithelial detachment, abnormal foveal contour.   Left Eye Quality was good. Scan locations included subfoveal. Central Foveal Thickness: 240. Progression has been stable. Findings include abnormal foveal contour, no IRF, no SRF, retinal drusen .   Notes Persistent subretinal fluid yet not in the FAZ but inferior, with subfoveal PED in the [place] region, repeat injection again today at 5-week follow-up OD, will repeat injection Avastin today and maintain interval examination next to 5 weeks right eye  OS no active disease       Intravitreal Injection, Pharmacologic Agent - OD - Right Eye       Time Out 09/26/2021. 12:20 PM. Confirmed correct patient, procedure, site, and patient consented.   Anesthesia Topical anesthesia was used. Anesthetic medications included Lidocaine 4%.   Procedure Preparation included Tobramycin 0.3%, 10% betadine to eyelids, 5% betadine to ocular surface. A 30 gauge needle was used.   Injection: 2.5 mg bevacizumab 2.5 MG/0.1ML   Route: Intravitreal, Site: Right Eye   NDC:  [phone], Lot: [id number]   Post-op Post injection exam found visual acuity of at least counting fingers. The patient tolerated the procedure well. There were no complications. The patient received written and verbal post procedure care education. Post injection medications included ocuflox.              ASSESSMENT/PLAN:  Intermediate stage nonexudative age-related macular degeneration of left eye No sign of CNVM OS  Exudative age-related macular degeneration of right eye with active choroidal neovascularization (HCC) The nature of wet macular degeneration was discussed with the patient.  Forms of therapy reviewed include the use of Anti-VEGF medications injected painlessly into the eye, as well as other possible treatment modalities, including thermal laser therapy. Fellow eye involvement and risks were discussed with the patient. Upon the finding of wet age related macular degeneration, treatment will be offered. The treatment regimen is on a treat as needed basis with the intent to treat if necessary and extend interval of exams when possible. On average 1 out of 6 patients do not need lifetime therapy. However, the risk of recurrent disease is high for a lifetime.  Initially monthly, then periodic, examinations and evaluations will determine whether the next treatment is required on the day of the examination.  Persistent subretinal fluid exists inferior to FAZ, currently interval follow-up is at 6 weeks, on Avastin.  We will repeat injection today and follow-up again in 6 weeks to maintain     ICD-10-CM   1. Exudative age-related macular degeneration of right eye with active choroidal neovascularization (HCC)  H35.3211 OCT, Retina - OU - Both Eyes    Intravitreal Injection, Pharmacologic Agent - OD - Right Eye    bevacizumab (AVASTIN) SOSY 2.5 mg    2. Intermediate stage nonexudative age-related macular degeneration of left eye  H35.3122  1.  OD, with chronic subretinal  fluid inferior to FAZ, pigment vascularized p igment epithelial detachment we will continue to monitor, at 6-week interval post injection Avastin today  2.  OS no sign of CNVM  3.  Ophthalmic Meds Ordered this visit:  Meds ordered this encounter  Medications   bevacizumab (AVASTIN) SOSY 2.5 mg       Return in about 6 weeks (around 11/07/2021) for dilate, OD.  There are no Patient Instructions on file for this visit.   Explained the diagnoses, plan, and follow up with the patient and they expressed understanding.  Patient expressed understanding of the importance of proper follow up care.   Clent Demark Naftuli Dalsanto M.D. Diseases & Surgery of the Retina and Vitreous Retina & Diabetic [place] 09/26/21     Abbreviations: M myopia (nearsighted); A astigmatism; H hyperopia (farsighted); P presbyopia; Mrx spectacle prescription;  CTL contact lenses; OD right eye; OS left eye; OU both eyes  XT exotropia; ET esotropia; PEK punctate epithelial keratitis; PEE punctate epithelial erosions; DES dry eye syndrome; MGD meibomian gland dysfunction; ATs artificial tears; PFAT's preservative free artificial tears; [place] nuclear sclerotic cataract; PSC posterior subcapsular cataract; ERM epi-retinal membrane; PVD posterior vitreous detachment; RD retinal detachment; DM diabetes mellitus; DR diabetic retinopathy; NPDR non-proliferative diabetic retinopathy; PDR proliferative diabetic retinopathy; CSME clinically significant macular edema; DME diabetic macular edema; dbh dot blot hemorrhages; CWS cotton wool spot; POAG primary open angle glaucoma; C/D cup-to-disc ratio; HVF humphrey visual field; GVF goldmann visual field; OCT optical coherence tomography; IOP intraocular pressure; BRVO Branch retinal vein occlusion; CRVO central retinal vein occlusion; CRAO central retinal artery occlusion; BRAO branch retinal artery occlusion; RT retinal tear; SB scleral buckle; PPV pars plana vitrectomy; VH Vitreous hemorrhage; PRP  panretinal laser photocoagulation; IVK intravitreal kenalog; VMT vitreomacular traction; MH Macular hole;  NVD neovascularization of the disc; NVE neovascularization elsewhere; AREDS age related eye disease study; ARMD age related macular degeneration; POAG primary open angle glaucoma; EBMD epithelial/anterior basement membrane dystrophy; ACIOL anterior chamber intraocular lens; IOL intraocular lens; PCIOL posterior chamber intraocular lens; Phaco/IOL phacoemulsification with intraocular lens placement; [place] photorefractive keratectomy; LASIK laser assisted in situ keratomileusis; HTN hypertension; DM diabetes mellitus; COPD chronic obstructive pulmonary disease

## 2021-09-26 NOTE — Assessment & Plan Note (Signed)
The nature of wet macular degeneration was discussed with the patient.  Forms of therapy reviewed include the use of Anti-VEGF medications injected painlessly into the eye, as well as other possible treatment modalities, including thermal laser therapy. Fellow eye involvement and risks were discussed with the patient. Upon the finding of wet age related macular degeneration, treatment will be offered. The treatment regimen is on a treat as needed basis with the intent to treat if necessary and extend interval of exams when possible. On average 1 out of 6 patients do not need lifetime therapy. However, the risk of recurrent disease is high for a lifetime.  Initially monthly, then periodic, examinations and evaluations will determine whether the next treatment is required on the day of the examination. ? ?Persistent subretinal fluid exists inferior to FAZ, currently interval follow-up is at 6 weeks, on Avastin.  We will repeat injection today and follow-up again in 6 weeks to maintain ?

## 2021-10-18 ENCOUNTER — Encounter (INDEPENDENT_AMBULATORY_CARE_PROVIDER_SITE_OTHER): Payer: Medicare PPO | Admitting: Ophthalmology

## 2021-10-22 DIAGNOSIS — F5101 Primary insomnia: Secondary | ICD-10-CM | POA: Diagnosis not present

## 2021-10-22 DIAGNOSIS — E871 Hypo-osmolality and hyponatremia: Secondary | ICD-10-CM | POA: Diagnosis not present

## 2021-10-22 DIAGNOSIS — R7303 Prediabetes: Secondary | ICD-10-CM | POA: Diagnosis not present

## 2021-10-22 DIAGNOSIS — J4520 Mild intermittent asthma, uncomplicated: Secondary | ICD-10-CM | POA: Diagnosis not present

## 2021-10-22 DIAGNOSIS — F17200 Nicotine dependence, unspecified, uncomplicated: Secondary | ICD-10-CM | POA: Diagnosis not present

## 2021-10-22 DIAGNOSIS — E7800 Pure hypercholesterolemia, unspecified: Secondary | ICD-10-CM | POA: Diagnosis not present

## 2021-10-22 DIAGNOSIS — F320 Major depressive disorder, single episode, mild: Secondary | ICD-10-CM | POA: Diagnosis not present

## 2021-10-22 DIAGNOSIS — J309 Allergic rhinitis, unspecified: Secondary | ICD-10-CM | POA: Diagnosis not present

## 2021-10-22 DIAGNOSIS — I480 Paroxysmal atrial fibrillation: Secondary | ICD-10-CM | POA: Diagnosis not present

## 2021-11-01 ENCOUNTER — Ambulatory Visit (INDEPENDENT_AMBULATORY_CARE_PROVIDER_SITE_OTHER): Payer: Medicare PPO | Admitting: Ophthalmology

## 2021-11-01 ENCOUNTER — Encounter (INDEPENDENT_AMBULATORY_CARE_PROVIDER_SITE_OTHER): Payer: Self-pay | Admitting: Ophthalmology

## 2021-11-01 DIAGNOSIS — H353211 Exudative age-related macular degeneration, right eye, with active choroidal neovascularization: Secondary | ICD-10-CM | POA: Diagnosis not present

## 2021-11-01 DIAGNOSIS — H353122 Nonexudative age-related macular degeneration, left eye, intermediate dry stage: Secondary | ICD-10-CM

## 2021-11-01 MED ORDER — BEVACIZUMAB 2.5 MG/0.1ML IZ SOSY
2.5000 mg | PREFILLED_SYRINGE | INTRAVITREAL | Status: AC | PRN
  Administered 2021-11-01: 2.5 mg via INTRAVITREAL

## 2021-11-01 NOTE — Assessment & Plan Note (Signed)
Chronic active subretinal fluid with vascularized PED OD, 5 weeks post Avastin.  Will need further resolution or to preserve acuity.  Repeat Avastin today ? ?Change to faricimab next if approved for good days assistance ?

## 2021-11-01 NOTE — Progress Notes (Signed)
? ? ?11/01/2021 ? ?  ? ?CHIEF COMPLAINT ?Patient presents for  ?Chief Complaint  ?Patient presents with  ? Macular Degeneration  ? ? ? ? ?HISTORY OF PRESENT ILLNESS: ?Michelle Gonzales is a 81 y.o. female who presents to the clinic today for:  ? ?HPI   ?5 weeks for dilate, OD. (Pt requested date) ?Pt stated no changes in vision. ?Pt denies any floaters and FOL. ? ? ?Last edited by Silvestre Moment on 11/01/2021  9:26 AM.  ?  ? ? ?Referring physician: ?[name], MD ?[address] ?[address] ?[place],  [place] [address] ? ?HISTORICAL INFORMATION:  ? ?Selected notes from the [facility] ?  ? ?Lab Results  ?Component Value Date  ? HGBA1C 5.7 (H) 09/21/2017  ?  ? ?CURRENT MEDICATIONS: ?Current Outpatient Medications (Ophthalmic Drugs)  ?Medication Sig  ? Carboxymethylcellul-Glycerin (LUBRICATING EYE DROPS OP) Place 1 drop into both eyes daily as needed (Dry Eyes).  ? ?No current facility-administered medications for this visit. (Ophthalmic Drugs)  ? ?Current Outpatient Medications (Other)  ?Medication Sig  ? albuterol (VENTOLIN HFA) 108 (90 Base) MCG/ACT inhaler Inhale 2 puffs into the lungs every 6 (six) hours as needed for wheezing or shortness of breath.  ? apixaban (ELIQUIS) 5 MG TABS tablet Take 1 tablet (5 mg total) by mouth 2 (two) times daily.  ? cholecalciferol (VITAMIN D) 1000 units tablet Take 1,000 Units by mouth daily.   ? metoprolol tartrate (LOPRESSOR) 25 MG tablet Take 1 tablet (25 mg total) by mouth 2 (two) times daily.  ? Misc Natural Products (OSTEO BI-FLEX TRIPLE STRENGTH PO) Take 1 tablet by mouth daily.  ? montelukast (SINGULAIR) 10 MG tablet Take 10 mg by mouth at bedtime.   ? sertraline (ZOLOFT) 50 MG tablet Take 50 mg by mouth daily.  ? traZODone (DESYREL) 50 MG tablet Take 0.5 tablets (25 mg total) by mouth at bedtime as needed for sleep.  ? vitamin C (ASCORBIC ACID) 500 MG tablet Take 500 mg by mouth daily.   ? ?No current facility-administered medications for this visit. (Other)   ? ? ? ? ?REVIEW OF SYSTEMS: ?ROS   ?Negative for: Constitutional, Gastrointestinal, Neurological, Skin, Genitourinary, Musculoskeletal, HENT, Endocrine, Cardiovascular, Eyes, Respiratory, Psychiatric, Allergic/Imm, Heme/Lymph ?Last edited by Silvestre Moment on 11/01/2021  9:26 AM.  ?  ? ? ? ?ALLERGIES ?Allergies  ?Allergen Reactions  ? Demerol [Meperidine] Nausea And Vomiting and Other (See Comments)  ?  Bad headache ,  Nausea/Vomiting.  ? Penicillins   ?  Has patient had a PCN reaction causing immediate rash, facial/tongue/throat swelling, SOB or lightheadedness with hypotension: YES ?Has patient had a PCN reaction causing severe rash involving mucus membranes or skin necrosis: NO ?Has patient had a PCN reaction that required hospitalization: NO ?Has patient had a PCN reaction occurring within the last 10 years: NO ?If all of the above answers are "NO", then may proceed with Cephalosporin use. ?  ? Codeine Other (See Comments)  ?  Massive headache  ? Sulfa Antibiotics Itching and Rash  ? ? ?PAST MEDICAL HISTORY ?Past Medical History:  ?Diagnosis Date  ? Allergy   ? Pcn, Sulfa, Codeine  ? Anxiety   ? Arthritis   ? Atrial fibrillation ([facility]) 09/22/2017  ? Cataract   ? surgey b/l  ? COPD (chronic obstructive pulmonary disease) ([facility])   ? Cough   ? Depression   ? hx years ago  ? Hordeolum internum right lower eyelid 05/11/2020  ? Hot flashes   ?  Shortness of breath   ? Sjogren's disease ([facility])   ? ?Past Surgical History:  ?Procedure Laterality Date  ? ABDOMINAL HYSTERECTOMY  74  ? partial  ? BREAST LUMPECTOMY WITH NEEDLE LOCALIZATION AND AXILLARY SENTINEL LYMPH NODE BX Right 02/01/2013  ? Procedure: BREAST LUMPECTOMY WITH NEEDLE LOCALIZATION AND AXILLARY SENTINEL LYMPH NODE BX;  Surgeon: Odis Hollingshead, MD;  Location: [facility];  Service: General;  Laterality: Right;  ? [place]  ? biopsy  ? CERVICAL FUSION  2010  ? CHOLECYSTECTOMY  2002  ? COLONOSCOPY    ? DILATION AND CURETTAGE OF UTERUS    ?  LEFT HEART CATH AND CORONARY ANGIOGRAPHY N/A 09/24/2017  ? Procedure: LEFT HEART CATH AND CORONARY ANGIOGRAPHY;  Surgeon: Leonie Man, MD;  Location: [facility];  Service: Cardiovascular;  Laterality: N/A;  ? ULTRASOUND GUIDANCE FOR VASCULAR ACCESS Right 09/24/2017  ? Procedure: Ultrasound Guidance For Vascular Access;  Surgeon: Leonie Man, MD;  Location: [facility];  Service: Cardiovascular;  Laterality: Right;  ? ? ?FAMILY HISTORY ?Family History  ?Problem Relation Age of Onset  ? Breast cancer Mother   ? ? ?SOCIAL HISTORY ?Social History  ? ?Tobacco Use  ? Smoking status: Every Day  ?  Packs/day: 0.50  ?  Types: Cigarettes  ?  Last attempt to quit: 10/07/2017  ?  Years since quitting: 4.0  ? Smokeless tobacco: Never  ?Vaping Use  ? Vaping Use: Never used  ?Substance Use Topics  ? Alcohol use: Not Currently  ? Drug use: No  ? ?  ? ?  ? ?OPHTHALMIC EXAM: ? ?Base Eye Exam   ? ? Visual Acuity (ETDRS)   ? ?   Right Left  ? Dist [place] 20/40 20/50  ? Dist ph [place] NI 20/40  ? ?  ?  ? ? Tonometry (Tonopen, 9:30 AM)   ? ?   Right Left  ? Pressure 13 10  ? ?  ?  ? ? Pupils   ? ?   Pupils APD  ? Right PERRL None  ? Left PERRL None  ? ?  ?  ? ? Visual Fields   ? ?   Left Right  ?  Full Full  ? ?  ?  ? ? Extraocular Movement   ? ?   Right Left  ?  Full Full  ? ?  ?  ? ? Neuro/Psych   ? ? Oriented x3: Yes  ? Mood/Affect: Normal  ? ?  ?  ? ? Dilation   ? ? Right eye: 2.5% Phenylephrine, 1.0% Mydriacyl @ 9:30 AM  ? ?  ?  ? ?  ? ?Slit Lamp and Fundus Exam   ? ? External Exam   ? ?   Right Left  ? External Normal Normal  ? ?  ?  ? ? Slit Lamp Exam   ? ?   Right Left  ? Lids/Lashes Internal hordeolum - lower lid, resolved, inspissated oral glands, meibomitis, rosacea blepharitis stable Normal  ? Conjunctiva/Sclera White and quiet White and quiet  ? Cornea Clear Clear  ? Anterior Chamber Deep and quiet Deep and quiet  ? Iris Round and reactive Round and reactive  ? Lens Posterior chamber intraocular lens Posterior  chamber intraocular lens  ? Anterior Vitreous Normal Normal  ? ?  ?  ? ? Fundus Exam   ? ?   Right Left  ? Posterior Vitreous Posterior vitreous detachment,  Central vitreous floaters   ? Disc Normal   ? C/D Ratio 0.4   ? Macula Subretinal neovascular membrane improved, Retinal pigment epithelial mottling, stable Macular thickening, RPE detachment, no retinal hemorrhage   ? Vessels Normal   ? Periphery Normal   ? ?  ?  ? ?  ? ? ?IMAGING AND PROCEDURES  ?Imaging and Procedures for 11/01/21 ? ?OCT, Retina - OU - Both Eyes   ? ?   ?Right Eye ?Quality was good. Scan locations included subfoveal. Central Foveal Thickness: 279. Progression has improved. Findings include subretinal fluid, pigment epithelial detachment, abnormal foveal contour.  ? ?Left Eye ?Quality was good. Scan locations included subfoveal. Central Foveal Thickness: 240. Progression has been stable. Findings include abnormal foveal contour, no IRF, no SRF, retinal drusen .  ? ?Notes ?Persistent subretinal fluid yet not in the FAZ but inferior, with subfoveal PED in the FAZ region, repeat injection again today at 5-week follow-up OD, will repeat injection Avastin today and maintain interval examination next to 5 weeks right eye and likely will change medications to faricimab ? ?OS no active disease ? ? ? ?  ? ?Intravitreal Injection, Pharmacologic Agent - OD - Right Eye   ? ?   ?Time Out ?11/01/2021. 9:55 AM. Confirmed correct patient, procedure, site, and patient consented.  ? ?Anesthesia ?Topical anesthesia was used. Anesthetic medications included Lidocaine 4%.  ? ?Procedure ?Preparation included Tobramycin 0.3%, 10% betadine to eyelids, 5% betadine to ocular surface. A 30 gauge needle was used.  ? ?Injection: ?2.5 mg bevacizumab 2.5 MG/0.1ML ?  Route: Intravitreal, Site: Right Eye ?  [place]: 52080-223-36, Lot: [id number]  ? ?Post-op ?Post injection exam found visual acuity of at least counting fingers. The patient tolerated the procedure well. There were no  complications. The patient received written and verbal post procedure care education. Post injection medications included ocuflox.  ? ?  ? ? ?  ?  ? ?  ?ASSESSMENT/PLAN: ? ?Intermediate stage nonexudative age-related m

## 2021-11-01 NOTE — Assessment & Plan Note (Signed)
No sign of CNVM OS 

## 2021-11-07 ENCOUNTER — Encounter (INDEPENDENT_AMBULATORY_CARE_PROVIDER_SITE_OTHER): Payer: Medicare PPO | Admitting: Ophthalmology

## 2021-11-08 ENCOUNTER — Other Ambulatory Visit: Payer: Self-pay | Admitting: Cardiovascular Disease

## 2021-11-08 DIAGNOSIS — I480 Paroxysmal atrial fibrillation: Secondary | ICD-10-CM

## 2021-11-08 NOTE — Telephone Encounter (Signed)
Prescription refill request for Eliquis received. ?Indication: Afib  ?Last office visit:03/30/21 ([name]) ?Scr: 0.80 (07/26/21) ?Age: 81 ?Weight: 65.6kg ? ?Appropriate dose and refill sent to requested pharmacy.  ?

## 2021-11-09 ENCOUNTER — Other Ambulatory Visit: Payer: Self-pay | Admitting: Cardiovascular Disease

## 2021-11-09 DIAGNOSIS — I480 Paroxysmal atrial fibrillation: Secondary | ICD-10-CM

## 2021-11-09 NOTE — Telephone Encounter (Signed)
Prescription refill request for Eliquis received. ?Indication: Afib  ?Last office visit:03/30/21 ([name]) ?Scr: 0.80 (07/26/21) ?Age: 81 ?Weight: 65.6kg ? ? ?Refill sent yesterday to [facility]; however, pt's daughter stated they need the prescription sent to [facility]. Appropriate dose and refill sent to requested pharmacy.  ?

## 2021-12-06 ENCOUNTER — Ambulatory Visit (INDEPENDENT_AMBULATORY_CARE_PROVIDER_SITE_OTHER): Payer: Medicare PPO | Admitting: Ophthalmology

## 2021-12-06 ENCOUNTER — Encounter (INDEPENDENT_AMBULATORY_CARE_PROVIDER_SITE_OTHER): Payer: Self-pay | Admitting: Ophthalmology

## 2021-12-06 ENCOUNTER — Encounter (INDEPENDENT_AMBULATORY_CARE_PROVIDER_SITE_OTHER): Payer: Medicare PPO | Admitting: Ophthalmology

## 2021-12-06 DIAGNOSIS — H353211 Exudative age-related macular degeneration, right eye, with active choroidal neovascularization: Secondary | ICD-10-CM | POA: Diagnosis not present

## 2021-12-06 DIAGNOSIS — H353122 Nonexudative age-related macular degeneration, left eye, intermediate dry stage: Secondary | ICD-10-CM

## 2021-12-06 MED ORDER — FARICIMAB-SVOA 6 MG/0.05ML IZ SOLN
6.0000 mg | INTRAVITREAL | Status: AC | PRN
  Administered 2021-12-06: 6 mg via INTRAVITREAL

## 2021-12-06 NOTE — Assessment & Plan Note (Signed)
Chronic subfoveal active fluid leakage even at 5 weeks post most recent treatment Avastin.  Thus resistant to Avastin.  We will commence therapy today with a Vabysmo OD

## 2021-12-06 NOTE — Progress Notes (Signed)
12/06/2021     CHIEF COMPLAINT Patient presents for  Chief Complaint  Patient presents with   Macular Degeneration      HISTORY OF PRESENT ILLNESS: Michelle Gonzales is a 81 y.o. female who presents to the clinic today for:   HPI   5 weeks dilate OU, OD, Vabysmo, OCT. Patient states vision is stable and unchanged since last visit. Denies any new floaters or FOL.  Last edited by Laurin Coder on 12/06/2021  9:56 AM.      Referring physician: [name], MD [address] [place],  [place] [address]  HISTORICAL INFORMATION:   Selected notes from the MEDICAL RECORD NUMBER    Lab Results  Component Value Date   HGBA1C 5.7 (H) 09/21/2017     CURRENT MEDICATIONS: Current Outpatient Medications (Ophthalmic Drugs)  Medication Sig   Carboxymethylcellul-Glycerin (LUBRICATING EYE DROPS OP) Place 1 drop into both eyes daily as needed (Dry Eyes).   No current facility-administered medications for this visit. (Ophthalmic Drugs)   Current Outpatient Medications (Other)  Medication Sig   albuterol (VENTOLIN HFA) 108 (90 Base) MCG/ACT inhaler Inhale 2 puffs into the lungs every 6 (six) hours as needed for wheezing or shortness of breath.   apixaban (ELIQUIS) 5 MG TABS tablet TAKE 1 TABLET BY MOUTH 2 TIMES DAILY.   cholecalciferol (VITAMIN D) 1000 units tablet Take 1,000 Units by mouth daily.    metoprolol tartrate (LOPRESSOR) 25 MG tablet Take 1 tablet (25 mg total) by mouth 2 (two) times daily.   Misc Natural Products (OSTEO BI-FLEX TRIPLE STRENGTH PO) Take 1 tablet by mouth daily.   montelukast (SINGULAIR) 10 MG tablet Take 10 mg by mouth at bedtime.    sertraline (ZOLOFT) 50 MG tablet Take 50 mg by mouth daily.   traZODone (DESYREL) 50 MG tablet Take 0.5 tablets (25 mg total) by mouth at bedtime as needed for sleep.   vitamin C (ASCORBIC ACID) 500 MG tablet Take 500 mg by mouth daily.    No current facility-administered medications for this visit. (Other)       REVIEW OF SYSTEMS: ROS   Negative for: Constitutional, Gastrointestinal, Neurological, Skin, Genitourinary, Musculoskeletal, HENT, Endocrine, Cardiovascular, Eyes, Respiratory, Psychiatric, Allergic/Imm, Heme/Lymph Last edited by Hurman Horn, MD on 12/06/2021 10:48 AM.       ALLERGIES Allergies  Allergen Reactions   Demerol [Meperidine] Nausea And Vomiting and Other (See Comments)    Bad headache ,  Nausea/Vomiting.   Penicillins     Has patient had a PCN reaction causing immediate rash, facial/tongue/throat swelling, SOB or lightheadedness with hypotension: YES Has patient had a PCN reaction causing severe rash involving mucus membranes or skin necrosis: NO Has patient had a PCN reaction that required hospitalization: NO Has patient had a PCN reaction occurring within the last 10 years: NO If all of the above answers are "NO", then may proceed with Cephalosporin use.    Codeine Other (See Comments)    Massive headache   Sulfa Antibiotics Itching and Rash    PAST MEDICAL HISTORY Past Medical History:  Diagnosis Date   Allergy    Pcn, Sulfa, Codeine   Anxiety    Arthritis    Atrial fibrillation ([facility]) 09/22/2017   Cataract    surgey b/l   COPD (chronic obstructive pulmonary disease) ([facility])    Cough    Depression    hx years ago   Hordeolum internum right lower eyelid 05/11/2020   Hot flashes  Shortness of breath    Sjogren's disease ([facility])    Past Surgical History:  Procedure Laterality Date   ABDOMINAL HYSTERECTOMY  74   partial   BREAST LUMPECTOMY WITH NEEDLE LOCALIZATION AND AXILLARY SENTINEL LYMPH NODE BX Right 02/01/2013   Procedure: BREAST LUMPECTOMY WITH NEEDLE LOCALIZATION AND AXILLARY SENTINEL LYMPH NODE BX;  Surgeon: Odis Hollingshead, MD;  Location: [facility];  Service: General;  Laterality: Right;   BREAST SURGERY Right 1965   biopsy   CERVICAL FUSION  2010   CHOLECYSTECTOMY  2002   COLONOSCOPY     DILATION AND CURETTAGE OF  UTERUS     LEFT HEART CATH AND CORONARY ANGIOGRAPHY N/A 09/24/2017   Procedure: LEFT HEART CATH AND CORONARY ANGIOGRAPHY;  Surgeon: Leonie Man, MD;  Location: [facility];  Service: Cardiovascular;  Laterality: N/A;   ULTRASOUND GUIDANCE FOR VASCULAR ACCESS Right 09/24/2017   Procedure: Ultrasound Guidance For Vascular Access;  Surgeon: Leonie Man, MD;  Location: [facility];  Service: Cardiovascular;  Laterality: Right;    FAMILY HISTORY Family History  Problem Relation Age of Onset   Breast cancer Mother     SOCIAL HISTORY Social History   Tobacco Use   Smoking status: Every Day    Packs/day: 0.50    Types: Cigarettes    Last attempt to quit: 10/07/2017    Years since quitting: 4.1   Smokeless tobacco: Never  Vaping Use   Vaping Use: Never used  Substance Use Topics   Alcohol use: Not Currently   Drug use: No         OPHTHALMIC EXAM:  Base Eye Exam     Visual Acuity (ETDRS)       Right Left   Dist [place] 20/50 20/50 -2   Dist ph [place] 20/40 -1 20/30         Tonometry (Tonopen, 10:02 AM)       Right Left   Pressure 14 10         Pupils       Pupils Dark Light APD   Right PERRL 5 4 None   Left PERRL 5 4 None         Extraocular Movement       Right Left    Full Full         Neuro/Psych     Oriented x3: Yes   Mood/Affect: Normal         Dilation     Both eyes: 1.0% Mydriacyl, 2.5% Phenylephrine @ 10:02 AM           Slit Lamp and Fundus Exam     External Exam       Right Left   External Normal Normal         Slit Lamp Exam       Right Left   Lids/Lashes Internal hordeolum - lower lid, resolved, inspissated oral glands, meibomitis, rosacea blepharitis stable Normal   Conjunctiva/Sclera White and quiet White and quiet   Cornea Clear Clear   Anterior Chamber Deep and quiet Deep and quiet   Iris Round and reactive Round and reactive   Lens Posterior chamber intraocular lens Posterior chamber intraocular  lens   Anterior Vitreous Normal Normal         Fundus Exam       Right Left   Posterior Vitreous Posterior vitreous detachment, Central vitreous floaters    Disc Normal    C/D Ratio 0.4  Macula Subretinal neovascular membrane improved, Retinal pigment epithelial mottling, stable Macular thickening, RPE detachment, no retinal hemorrhage    Vessels Normal    Periphery Normal             IMAGING AND PROCEDURES  Imaging and Procedures for 12/06/21  Intravitreal Injection, Pharmacologic Agent - OD - Right Eye       Time Out 12/06/2021. 10:50 AM. Confirmed correct patient, procedure, site, and patient consented.   Anesthesia Topical anesthesia was used. Anesthetic medications included Lidocaine 4%.   Procedure Preparation included Tobramycin 0.3%, 10% betadine to eyelids, 5% betadine to ocular surface. A 30 gauge needle was used.   Injection: 6 mg faricimab-svoa 6 MG/0.05ML   Route: Intravitreal, Site: Right Eye   NDC: [phone], Lot: [id number], Expiration date: 07/14/2022, Waste: 0 mL   Post-op Post injection exam found visual acuity of at least counting fingers. The patient tolerated the procedure well. There were no complications. The patient received written and verbal post procedure care education. Post injection medications included ocuflox.      OCT, Retina - OU - Both Eyes       Right Eye Quality was good. Scan locations included subfoveal. Central Foveal Thickness: 286. Progression has improved. Findings include subretinal fluid, pigment epithelial detachment, abnormal foveal contour.   Left Eye Quality was good. Scan locations included subfoveal. Central Foveal Thickness: 240. Progression has been stable. Findings include abnormal foveal contour, no IRF, no SRF, retinal drusen .   Notes Persistent subretinal fluid yet not in the FAZ but inferior, with subfoveal PED in the [place] region, repeat injection again today at 5-week follow-up OD, will change to  Vabysmo treatment OD today to look for enhanced activity for treatment of chronic CNVM.   OS no active disease               ASSESSMENT/PLAN:  Exudative age-related macular degeneration of right eye with active choroidal neovascularization (HCC) Chronic subfoveal active fluid leakage even at 5 weeks post most recent treatment Avastin.  Thus resistant to Avastin.  We will commence therapy today with a Vabysmo OD  Intermediate stage nonexudative age-related macular degeneration of left eye No sign of CNVM OS     ICD-10-CM   1. Exudative age-related macular degeneration of right eye with active choroidal neovascularization (HCC)  H35.3211 Intravitreal Injection, Pharmacologic Agent - OD - Right Eye    OCT, Retina - OU - Both Eyes    faricimab-svoa (VABYSMO) '6mg'$ /0.69m intravitreal injection    2. Intermediate stage nonexudative age-related macular degeneration of left eye  H35.3122       1.  Chronic active CNVM OD, with chronic subretinal fluid resistant on Avastin.  Repeat treatment today at 5-week interval, will change to Vabysmo treatment OD today  2.  Dilate OU next  3.  Ophthalmic Meds Ordered this visit:  Meds ordered this encounter  Medications   faricimab-svoa (VABYSMO) '6mg'$ /0.024mintravitreal injection       Return in about 5 weeks (around 01/10/2022) for DILATE OU, VABYSMO OCT, OD.  There are no Patient Instructions on file for this visit.   Explained the diagnoses, plan, and follow up with the patient and they expressed understanding.  Patient expressed understanding of the importance of proper follow up care.   GaClent Demarkankin M.D. Diseases & Surgery of the Retina and Vitreous Retina & Diabetic EyBadger Lee5/18/23     Abbreviations: M myopia (nearsighted); A astigmatism; H hyperopia (farsighted); P presbyopia; Mrx spectacle prescription;  CTL  contact lenses; OD right eye; OS left eye; OU both eyes  XT exotropia; ET esotropia; PEK punctate  epithelial keratitis; PEE punctate epithelial erosions; DES dry eye syndrome; MGD meibomian gland dysfunction; ATs artificial tears; PFAT's preservative free artificial tears; [place] nuclear sclerotic cataract; PSC posterior subcapsular cataract; ERM epi-retinal membrane; PVD posterior vitreous detachment; RD retinal detachment; DM diabetes mellitus; DR diabetic retinopathy; NPDR non-proliferative diabetic retinopathy; PDR proliferative diabetic retinopathy; CSME clinically significant macular edema; DME diabetic macular edema; dbh dot blot hemorrhages; CWS cotton wool spot; POAG primary open angle glaucoma; C/D cup-to-disc ratio; HVF humphrey visual field; GVF goldmann visual field; OCT optical coherence tomography; IOP intraocular pressure; BRVO Branch retinal vein occlusion; CRVO central retinal vein occlusion; CRAO central retinal artery occlusion; BRAO branch retinal artery occlusion; RT retinal tear; SB scleral buckle; PPV pars plana vitrectomy; VH Vitreous hemorrhage; PRP panretinal laser photocoagulation; IVK intravitreal kenalog; VMT vitreomacular traction; MH Macular hole;  NVD neovascularization of the disc; NVE neovascularization elsewhere; AREDS age related eye disease study; ARMD age related macular degeneration; POAG primary open angle glaucoma; EBMD epithelial/anterior basement membrane dystrophy; ACIOL anterior chamber intraocular lens; IOL intraocular lens; PCIOL posterior chamber intraocular lens; Phaco/IOL phacoemulsification with intraocular lens placement; [place] photorefractive keratectomy; LASIK laser assisted in situ keratomileusis; HTN hypertension; DM diabetes mellitus; COPD chronic obstructive pulmonary disease

## 2021-12-06 NOTE — Assessment & Plan Note (Signed)
No sign of CNVM OS 

## 2022-01-03 DIAGNOSIS — H353123 Nonexudative age-related macular degeneration, left eye, advanced atrophic without subfoveal involvement: Secondary | ICD-10-CM | POA: Diagnosis not present

## 2022-01-03 DIAGNOSIS — H353211 Exudative age-related macular degeneration, right eye, with active choroidal neovascularization: Secondary | ICD-10-CM | POA: Diagnosis not present

## 2022-01-03 DIAGNOSIS — H35033 Hypertensive retinopathy, bilateral: Secondary | ICD-10-CM | POA: Diagnosis not present

## 2022-01-03 DIAGNOSIS — Z961 Presence of intraocular lens: Secondary | ICD-10-CM | POA: Diagnosis not present

## 2022-01-10 ENCOUNTER — Ambulatory Visit (INDEPENDENT_AMBULATORY_CARE_PROVIDER_SITE_OTHER): Payer: Medicare PPO | Admitting: Ophthalmology

## 2022-01-10 ENCOUNTER — Encounter (INDEPENDENT_AMBULATORY_CARE_PROVIDER_SITE_OTHER): Payer: Medicare PPO | Admitting: Ophthalmology

## 2022-01-10 ENCOUNTER — Encounter (INDEPENDENT_AMBULATORY_CARE_PROVIDER_SITE_OTHER): Payer: Self-pay | Admitting: Ophthalmology

## 2022-01-10 DIAGNOSIS — H353122 Nonexudative age-related macular degeneration, left eye, intermediate dry stage: Secondary | ICD-10-CM

## 2022-01-10 DIAGNOSIS — H02834 Dermatochalasis of left upper eyelid: Secondary | ICD-10-CM | POA: Diagnosis not present

## 2022-01-10 DIAGNOSIS — H35721 Serous detachment of retinal pigment epithelium, right eye: Secondary | ICD-10-CM

## 2022-01-10 DIAGNOSIS — H02831 Dermatochalasis of right upper eyelid: Secondary | ICD-10-CM | POA: Diagnosis not present

## 2022-01-10 DIAGNOSIS — H353211 Exudative age-related macular degeneration, right eye, with active choroidal neovascularization: Secondary | ICD-10-CM

## 2022-01-10 MED ORDER — FARICIMAB-SVOA 6 MG/0.05ML IZ SOLN
6.0000 mg | INTRAVITREAL | Status: AC | PRN
  Administered 2022-01-10: 6 mg via INTRAVITREAL

## 2022-01-10 NOTE — Assessment & Plan Note (Signed)
No sign of CNVM OS by exam or OCT

## 2022-01-10 NOTE — Assessment & Plan Note (Signed)
Minor impact

## 2022-01-10 NOTE — Assessment & Plan Note (Signed)
Component of wet AMD persistent

## 2022-01-10 NOTE — Assessment & Plan Note (Signed)
Postinjection #1 much less subretinal fluid Vabysmo, will repeat injection today maintain 5-week interval follow-up until all fluid resolved

## 2022-02-14 ENCOUNTER — Ambulatory Visit (INDEPENDENT_AMBULATORY_CARE_PROVIDER_SITE_OTHER): Payer: Medicare PPO | Admitting: Ophthalmology

## 2022-02-14 ENCOUNTER — Encounter (INDEPENDENT_AMBULATORY_CARE_PROVIDER_SITE_OTHER): Payer: Medicare PPO | Admitting: Ophthalmology

## 2022-02-14 ENCOUNTER — Encounter (INDEPENDENT_AMBULATORY_CARE_PROVIDER_SITE_OTHER): Payer: Self-pay | Admitting: Ophthalmology

## 2022-02-14 DIAGNOSIS — H35721 Serous detachment of retinal pigment epithelium, right eye: Secondary | ICD-10-CM

## 2022-02-14 DIAGNOSIS — H353122 Nonexudative age-related macular degeneration, left eye, intermediate dry stage: Secondary | ICD-10-CM

## 2022-02-14 DIAGNOSIS — H353211 Exudative age-related macular degeneration, right eye, with active choroidal neovascularization: Secondary | ICD-10-CM

## 2022-02-14 MED ORDER — FARICIMAB-SVOA 6 MG/0.05ML IZ SOLN
6.0000 mg | INTRAVITREAL | Status: AC | PRN
  Administered 2022-02-14: 6 mg via INTRAVITREAL

## 2022-02-14 NOTE — Assessment & Plan Note (Addendum)
Improved post Vabysmo No. 2 with improved acuity, confirmed improved by acuity improvement as well as less subretinal fluid on OCT today

## 2022-02-14 NOTE — Assessment & Plan Note (Signed)
No sign of CNVM OS 

## 2022-02-14 NOTE — Assessment & Plan Note (Signed)
Component of wet AMD improving OD post change to [place]

## 2022-02-14 NOTE — Progress Notes (Signed)
02/14/2022     CHIEF COMPLAINT Patient presents for  Chief Complaint  Patient presents with   Macular Degeneration      HISTORY OF PRESENT ILLNESS: Michelle Gonzales is a 81 y.o. female who presents to the clinic today for:   HPI   No interval change in vision of the right eye post recent injection of antivegF for CNVM at 5-week interval today. Last edited by Hurman Horn, MD on 02/14/2022 10:05 AM.      Referring physician: [name], MD [address],  [place] [address]  HISTORICAL INFORMATION:   Selected notes from the MEDICAL RECORD NUMBER    Lab Results  Component Value Date   HGBA1C 5.7 (H) 09/21/2017     CURRENT MEDICATIONS: Current Outpatient Medications (Ophthalmic Drugs)  Medication Sig   Carboxymethylcellul-Glycerin (LUBRICATING EYE DROPS OP) Place 1 drop into both eyes daily as needed (Dry Eyes).   No current facility-administered medications for this visit. (Ophthalmic Drugs)   Current Outpatient Medications (Other)  Medication Sig   albuterol (VENTOLIN HFA) 108 (90 Base) MCG/ACT inhaler Inhale 2 puffs into the lungs every 6 (six) hours as needed for wheezing or shortness of breath.   apixaban (ELIQUIS) 5 MG TABS tablet TAKE 1 TABLET BY MOUTH 2 TIMES DAILY.   cholecalciferol (VITAMIN D) 1000 units tablet Take 1,000 Units by mouth daily.    metoprolol tartrate (LOPRESSOR) 25 MG tablet Take 1 tablet (25 mg total) by mouth 2 (two) times daily.   Misc Natural Products (OSTEO BI-FLEX TRIPLE STRENGTH PO) Take 1 tablet by mouth daily.   montelukast (SINGULAIR) 10 MG tablet Take 10 mg by mouth at bedtime.    sertraline (ZOLOFT) 50 MG tablet Take 50 mg by mouth daily.   traZODone (DESYREL) 50 MG tablet Take 0.5 tablets (25 mg total) by mouth at bedtime as needed for sleep.   vitamin C (ASCORBIC ACID) 500 MG tablet Take 500 mg by mouth daily.    No current facility-administered medications for this visit. (Other)      REVIEW OF  SYSTEMS: ROS   Negative for: Constitutional, Gastrointestinal, Neurological, Skin, Genitourinary, Musculoskeletal, HENT, Endocrine, Cardiovascular, Eyes, Respiratory, Psychiatric, Allergic/Imm, Heme/Lymph Last edited by Hurman Horn, MD on 02/14/2022 10:02 AM.       ALLERGIES Allergies  Allergen Reactions   Demerol [Meperidine] Nausea And Vomiting and Other (See Comments)    Bad headache ,  Nausea/Vomiting.   Penicillins     Has patient had a PCN reaction causing immediate rash, facial/tongue/throat swelling, SOB or lightheadedness with hypotension: YES Has patient had a PCN reaction causing severe rash involving mucus membranes or skin necrosis: NO Has patient had a PCN reaction that required hospitalization: NO Has patient had a PCN reaction occurring within the last 10 years: NO If all of the above answers are "NO", then may proceed with Cephalosporin use.    Codeine Other (See Comments)    Massive headache   Sulfa Antibiotics Itching and Rash    PAST MEDICAL HISTORY Past Medical History:  Diagnosis Date   Allergy    Pcn, Sulfa, Codeine   Anxiety    Arthritis    Atrial fibrillation ([place]) 09/22/2017   Cataract    surgey b/l   COPD (chronic obstructive pulmonary disease) (HCC)    Cough    Depression    hx years ago   Hordeolum internum right lower eyelid 05/11/2020   Hot flashes    Shortness of breath  Sjogren's disease ([place])    Past Surgical History:  Procedure Laterality Date   ABDOMINAL HYSTERECTOMY  74   partial   BREAST LUMPECTOMY WITH NEEDLE LOCALIZATION AND AXILLARY SENTINEL LYMPH NODE BX Right 02/01/2013   Procedure: BREAST LUMPECTOMY WITH NEEDLE LOCALIZATION AND AXILLARY SENTINEL LYMPH NODE BX;  Surgeon: Odis Hollingshead, MD;  Location: [facility];  Service: General;  Laterality: Right;   BREAST SURGERY Right 1965   biopsy   CERVICAL FUSION  2010   CHOLECYSTECTOMY  2002   COLONOSCOPY     DILATION AND CURETTAGE OF UTERUS     LEFT  HEART CATH AND CORONARY ANGIOGRAPHY N/A 09/24/2017   Procedure: LEFT HEART CATH AND CORONARY ANGIOGRAPHY;  Surgeon: Leonie Man, MD;  Location: [facility];  Service: Cardiovascular;  Laterality: N/A;   ULTRASOUND GUIDANCE FOR VASCULAR ACCESS Right 09/24/2017   Procedure: Ultrasound Guidance For Vascular Access;  Surgeon: Leonie Man, MD;  Location: [facility];  Service: Cardiovascular;  Laterality: Right;    FAMILY HISTORY Family History  Problem Relation Age of Onset   Breast cancer Mother     SOCIAL HISTORY Social History   Tobacco Use   Smoking status: Every Day    Packs/day: 0.50    Types: Cigarettes    Last attempt to quit: 10/07/2017    Years since quitting: 4.3   Smokeless tobacco: Never  Vaping Use   Vaping Use: Never used  Substance Use Topics   Alcohol use: Not Currently   Drug use: No         OPHTHALMIC EXAM:  Base Eye Exam     Visual Acuity (ETDRS)       Right Left   Dist  20/50 20/50         Tonometry (Tonopen, 10:03 AM)       Right Left   Pressure 16 16         Pupils       Pupils APD   Right PERRL None   Left PERRL None         Extraocular Movement       Right Left    Full, Ortho Full, Ortho         Neuro/Psych     Oriented x3: Yes   Mood/Affect: Normal           Slit Lamp and Fundus Exam     External Exam       Right Left   External Normal Normal         Slit Lamp Exam       Right Left   Lids/Lashes 1+ Dermatochalasis - upper lid 1+ Dermatochalasis - upper lid   Conjunctiva/Sclera White and quiet White and quiet   Cornea Clear Clear   Anterior Chamber Deep and quiet Deep and quiet   Iris Round and reactive Round and reactive   Lens Posterior chamber intraocular lens Posterior chamber intraocular lens   Anterior Vitreous Normal Normal         Fundus Exam       Right Left   Posterior Vitreous Posterior vitreous detachment, Central vitreous floaters Posterior vitreous  detachment, Central vitreous floaters   Disc Normal Normal   C/D Ratio 0.4 0.5   Macula Subretinal neovascular membrane improved, Retinal pigment epithelial mottling, stable Macular thickening, RPE detachment, no retinal hemorrhage Intermediate age related macular degeneration, Hard drusen, no macular thickening, no membrane   Vessels Normal Normal   Periphery Normal  Normal            IMAGING AND PROCEDURES  Imaging and Procedures for 02/14/22  OCT, Retina - OU - Both Eyes       Right Eye Quality was good. Scan locations included subfoveal. Central Foveal Thickness: 272. Progression has improved. Findings include abnormal foveal contour, subretinal hyper-reflective material, pigment epithelial detachment, subretinal fluid.   Left Eye Quality was good. Scan locations included subfoveal. Central Foveal Thickness: 240. Progression has been stable. Findings include no IRF, no SRF, abnormal foveal contour, retinal drusen .   Notes Much improved subretinal fluid in the FAZ region but still slightly active subfoveal PED in the FAZ region, injection #2 Vabysmo OD  OS no active disease        Intravitreal Injection, Pharmacologic Agent - OD - Right Eye       Time Out 02/14/2022. 10:25 AM. Confirmed correct patient, procedure, site, and patient consented.   Anesthesia Topical anesthesia was used. Anesthetic medications included Lidocaine 4%.   Procedure Preparation included 5% betadine to ocular surface, 10% betadine to eyelids, Tobramycin 0.3%. A 30 gauge needle was used.   Injection: 6 mg faricimab-svoa 6 MG/0.05ML   Route: Intravitreal, Site: Right Eye   NDC: [phone], Lot: [id number], Expiration date: 11/20/2023, Waste: 0 mL   Post-op Post injection exam found visual acuity of at least counting fingers. The patient tolerated the procedure well. There were no complications. The patient received written and verbal post procedure care education. Post injection medications  included ocuflox.              ASSESSMENT/PLAN:  Intermediate stage nonexudative age-related macular degeneration of left eye No sign of CNVM OS  Exudative age-related macular degeneration of right eye with active choroidal neovascularization (HCC) Improved post Vabysmo No. 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 Exudative age-related macular degeneration of right eye with active choroidal neovascularization (HCC)  H35.3211 OCT, Retina - OU - Both Eyes    Intravitreal Injection, Pharmacologic Agent - OD - Right Eye    faricimab-svoa (VABYSMO) '6mg'$ /0.28m intravitreal injection    2. Intermediate stage nonexudative age-related macular degeneration of left eye  H35.3122     3. 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  Subretinal fluid from wet AMD OD with vascularized PED improving overall.  Currently at 5-week interval post Vabysmo No. 2.  Repeat injection today to maintain  2.  Follow-up reevaluate OD in 6 weeks  3.  Ophthalmic Meds Ordered this visit:  Meds ordered this encounter  Medications   faricimab-svoa (VABYSMO) '6mg'$ /0.072mintravitreal injection       Return in about 6 weeks (around 03/28/2022) for dilate, OD, VABYSMO OCT.  There are no Patient Instructions on file for this visit.   Explained the diagnoses, plan, and follow up with the patient and they expressed understanding.  Patient expressed understanding of the importance of proper follow up care.   GaClent Demarkankin M.D. Diseases & Surgery of the Retina and Vitreous [facility]abetic EyCol[facility]7/27/23     Abbreviations: M myopia (nearsighted); A astigmatism; H hyperopia (farsighted); P presbyopia; Mrx spectacle prescription;  CTL contact lenses; OD right eye; OS left eye;  OU both eyes  XT exotropia; ET esotropia;  PEK punctate epithelial keratitis; PEE punctate epithelial erosions; DES dry eye syndrome; MGD meibomian gland dysfunction; ATs artificial tears; PFAT's preservative free artificial tears; [place] nuclear sclerotic cataract; PSC posterior subcapsular cataract; ERM epi-retinal membrane; PVD posterior vitreous detachment; RD retinal detachment; DM diabetes mellitus; DR diabetic retinopathy; NPDR non-proliferative diabetic retinopathy; PDR proliferative diabetic retinopathy; CSME clinically significant macular edema; DME diabetic macular edema; dbh dot blot hemorrhages; CWS cotton wool spot; POAG primary open angle glaucoma; C/D cup-to-disc ratio; HVF humphrey visual field; GVF goldmann visual field; OCT optical coherence tomography; IOP intraocular pressure; BRVO Branch retinal vein occlusion; CRVO central retinal vein occlusion; CRAO central retinal artery occlusion; BRAO branch retinal artery occlusion; RT retinal tear; SB scleral buckle; PPV pars plana vitrectomy; VH Vitreous hemorrhage; PRP panretinal laser photocoagulation; IVK intravitreal kenalog; VMT vitreomacular traction; MH Macular hole;  NVD neovascularization of the disc; NVE neovascularization elsewhere; AREDS age related eye disease study; ARMD age related macular degeneration; POAG primary open angle glaucoma; EBMD epithelial/anterior basement membrane dystrophy; ACIOL anterior chamber intraocular lens; IOL intraocular lens; PCIOL posterior chamber intraocular lens; Phaco/IOL phacoemulsification with intraocular lens placement; [place] photorefractive keratectomy; LASIK laser assisted in situ keratomileusis; HTN hypertension; DM diabetes mellitus; COPD chronic obstructive pulmonary disease

## 2022-03-04 ENCOUNTER — Other Ambulatory Visit: Payer: Self-pay | Admitting: Cardiovascular Disease

## 2022-03-04 DIAGNOSIS — I480 Paroxysmal atrial fibrillation: Secondary | ICD-10-CM

## 2022-03-04 NOTE — Telephone Encounter (Signed)
Prescription refill request for Eliquis received. Indication:Afib Last office visit:9/22 Scr:0.8 Age: 81 Weight:65.6 kg  Prescription refilled

## 2022-03-26 ENCOUNTER — Other Ambulatory Visit: Payer: Self-pay | Admitting: Cardiovascular Disease

## 2022-03-26 ENCOUNTER — Encounter: Payer: Self-pay | Admitting: Physician Assistant

## 2022-03-26 DIAGNOSIS — I2510 Atherosclerotic heart disease of native coronary artery without angina pectoris: Secondary | ICD-10-CM

## 2022-03-26 HISTORY — DX: Atherosclerotic heart disease of native coronary artery without angina pectoris: I25.10

## 2022-03-26 MED ORDER — METOPROLOL TARTRATE 25 MG PO TABS: 25.0000 mg | ORAL_TABLET | Freq: Two times a day (BID) | ORAL | 3 refills | Status: DC

## 2022-03-26 NOTE — Progress Notes (Unsigned)
Cardiology Office Note:    Date:  03/27/2022   ID:  Michelle Gonzales, DOB 10-01-1940, MRN [id number]  PCP:  [name], [facility] Providers Cardiologist:  Mertie Moores, MD     Referring MD: [name], MD   Chief Complaint:  Follow-up for atrial fibrillation    Patient Profile: Coronary artery disease Minimal non-obs CAD on cath in 3/19 Paroxysmal atrial fibrillation  Breast CA s/p R lumpectomy  +Cigs  Chronic Obstructive Pulmonary Disease  Sjogren's Disease  Hyponatremia in 07/2021 in setting of recent AECOPD and +influenza and dehydration with GI e-lyte loss (diarrhea) Macular degeneration   Prior CV Studies: LEFT HEART CATH AND CORONARY ANGIOGRAPHY 09/24/2017 LAD proximal 20 RCA proximal 20 EF 55-65 2+ MR   MYOVIEW 09/23/17 The study is affected by artifacts however there appears to be an ischemia in the basal and mid inferior and inferolateral walls and in the septal, inferior and lateral walls.  Overall LVEF is decreased calculated at 51%.   ECHO COMPLETE WO IMAGING ENHANCING AGENT 09/22/2017 Mild concentric LVH, EF 55-60, normal wall motion, mild AI, trivial MR, trivial pericardial effusion   CARDIAC TELEMETRY MONITORING-INTERPRETATION ONLY 10/17/2017  NSR  Occasional PVCs  No significant arrhythmias     History of Present Illness:   Michelle Gonzales is a 81 y.o. female with the above problem list.  She was last seen in Sept 2022 by Dr. Acie Fredrickson. She returns for f/u.  She is here alone.  She was admitted to the hospital in January with hyponatremia in the setting of COPD exacerbation and influenza.  I reviewed her chart and her EKG at that time did demonstrate atrial fibrillation.  Since that time, she has been doing well.  She has not had chest pain, shortness of breath, syncope, orthopnea, leg edema, palpitations.    Past Medical History:  Diagnosis Date   Allergy    Pcn, Sulfa, Codeine   Anxiety    Arthritis    Atrial fibrillation  (HCC) 09/22/2017   Cataract    surgey b/l   COPD (chronic obstructive pulmonary disease) (HCC)    Coronary artery disease involving native coronary artery of native heart without angina pectoris 03/26/2022   Minimal nonobstructive disease // Cath 09/2017: Proximal LAD 20, proximal RCA 20   Cough    Depression    hx years ago   Hordeolum internum right lower eyelid 05/11/2020   Hot flashes    Shortness of breath    Sjogren's disease ([place])    Current Medications: Current Meds  Medication Sig   albuterol (VENTOLIN HFA) 108 (90 Base) MCG/ACT inhaler Inhale 2 puffs into the lungs every 6 (six) hours as needed for wheezing or shortness of breath.   apixaban (ELIQUIS) 5 MG TABS tablet TAKE 1 TABLET BY MOUTH TWICE DAILY   Carboxymethylcellul-Glycerin (LUBRICATING EYE DROPS OP) Place 1 drop into both eyes daily as needed (Dry Eyes).   cholecalciferol (VITAMIN D) 1000 units tablet Take 1,000 Units by mouth daily.    fluticasone (FLONASE) 50 MCG/ACT nasal spray Place 1 spray into both nostrils daily.   metoprolol tartrate (LOPRESSOR) 25 MG tablet Take 1 tablet (25 mg total) by mouth 2 (two) times daily.   Misc Natural Products (OSTEO BI-FLEX TRIPLE STRENGTH PO) Take 1 tablet by mouth daily.   montelukast (SINGULAIR) 10 MG tablet Take 10 mg by mouth at bedtime.    sertraline (ZOLOFT) 50 MG tablet Take 50 mg by mouth daily.   traZODone (DESYREL) 50  MG tablet Take 0.5 tablets (25 mg total) by mouth at bedtime as needed for sleep.   vitamin C (ASCORBIC ACID) 500 MG tablet Take 500 mg by mouth daily.     Allergies:   Demerol [meperidine], Penicillins, Codeine, and Sulfa antibiotics   Social History   Tobacco Use   Smoking status: Every Day    Packs/day: 0.50    Types: Cigarettes    Last attempt to quit: 10/07/2017    Years since quitting: 4.4   Smokeless tobacco: Never  Vaping Use   Vaping Use: Never used  Substance Use Topics   Alcohol use: Not Currently   Drug use: No    Family Hx: The  patient's family history includes Breast cancer in her mother.  Review of Systems  Respiratory:  Negative for hemoptysis.   Gastrointestinal:  Negative for hematochezia and melena.  Genitourinary:  Negative for hematuria.     EKGs/Labs/Other Test Reviewed:    EKG:  EKG is  ordered today.  The ekg ordered today demonstrates atrial fibrillation, HR 88, QTc 438  Recent Labs: 07/20/2021: TSH 1.056 07/24/2021: ALT 24; B Natriuretic Peptide 343.3; Magnesium 1.9 07/26/2021: BUN 9; Creatinine, Ser 0.80; Hemoglobin 13.0; Platelets 236; Potassium 3.7; Sodium 133   Recent Lipid Panel No results for input(s): "CHOL", "TRIG", "HDL", "VLDL", "LDLCALC", "LDLDIRECT" in the last 8760 hours.   Risk Assessment/Calculations/Metrics:    CHA2DS2-VASc Score = 4   This indicates a 4.8% annual risk of stroke. The patient's score is based upon: CHF History: 0 HTN History: 0 Diabetes History: 0 Stroke History: 0 Vascular Disease History: 1 Age Score: 2 Gender Score: 1             Physical Exam:    VS:  BP 110/62   Pulse 88   Ht '5\' 1"'$  (1.549 m)   Wt 149 lb 9.6 oz (67.9 kg)   SpO2 93%   BMI 28.27 kg/m     Wt Readings from Last 3 Encounters:  03/27/22 149 lb 9.6 oz (67.9 kg)  07/25/21 144 lb 10 oz (65.6 kg)  07/19/21 134 lb (60.8 kg)    Constitutional:      Appearance: Healthy appearance. Not in distress.  Neck:     Vascular: JVD normal.  Pulmonary:     Effort: Pulmonary effort is normal.     Breath sounds: No wheezing. No rales.  Cardiovascular:     Normal rate. Irregularly irregular rhythm. Normal S1. Normal S2.      Murmurs: There is no murmur.  Edema:    Peripheral edema absent.  Abdominal:     Palpations: Abdomen is soft.  Skin:    General: Skin is warm and dry.  Neurological:     Mental Status: Alert and oriented to person, place and time.         ASSESSMENT & PLAN:   Paroxysmal atrial fibrillation ([place]) She is in atrial fibrillation today. She is not really  symptomatic.  Heart rate is controlled.  We discussed the benefit of restoring normal sinus rhythm.  I have recommended proceeding with cardioversion.  I reviewed this with Dr. Acie Fredrickson who agreed.  She has been on Eliquis consistently without missing a dose.  Age is <80, weight >60 kg, creatinine <1.5.  She is on the appropriate dose of Eliquis. Arrange cardioversion BMET, CBC today Continue Eliquis 5 mg twice daily, Lopressor 25 mg twice daily Follow-up in the A-fib clinic after cardioversion Follow-up with Dr. Acie Fredrickson or me in 3 months  Coronary artery disease involving native coronary artery of native heart without angina pectoris Minimal nonobstructive disease on cardiac catheterization in 2019.  She is not on antiplatelet therapy as she is on Eliquis.  Lipids are managed by primary care.  She is not having anginal symptoms.        Shared Decision Making/Informed Consent The risks (stroke, cardiac arrhythmias rarely resulting in the need for a temporary or permanent pacemaker, skin irritation or burns and complications associated with conscious sedation including aspiration, arrhythmia, respiratory failure and death), benefits (restoration of normal sinus rhythm) and alternatives of a direct current cardioversion were explained in detail to Ms. Langsam and she agrees to proceed.     Dispo:  No follow-ups on file.   Medication Adjustments/Labs and Tests Ordered: Current medicines are reviewed at length with the patient today.  Concerns regarding medicines are outlined above.  Tests Ordered: Orders Placed This Encounter  Procedures   Basic metabolic panel   CBC   EKG 12-Lead   Medication Changes: No orders of the defined types were placed in this encounter.  Signed, Richardson Dopp, PA-C  03/27/2022 12:07 PM    [address], [place], [place]  [address] Phone: [phone]; Fax: [phone]

## 2022-03-26 NOTE — H&P (View-Only) (Signed)
Cardiology Office Note:    Date:  03/27/2022   ID:  Michelle Gonzales, DOB 1940-09-05, MRN [id number]  PCP:  [name], [place] [facility] Cardiologist:  Mertie Moores, MD     Referring MD: [name], MD   Chief Complaint:  Follow-up for atrial fibrillation    Patient Profile: Coronary artery disease Minimal non-obs CAD on cath in 3/19 Paroxysmal atrial fibrillation  Breast CA s/p R lumpectomy  +Cigs  Chronic Obstructive Pulmonary Disease  Sjogren's Disease  Hyponatremia in 07/2021 in setting of recent AECOPD and +influenza and dehydration with GI e-lyte loss (diarrhea) Macular degeneration   Prior CV Studies: LEFT HEART CATH AND CORONARY ANGIOGRAPHY 09/24/2017 LAD proximal 20 RCA proximal 20 EF 55-65 2+ MR   MYOVIEW 09/23/17 The study is affected by artifacts however there appears to be an ischemia in the basal and mid inferior and inferolateral walls and in the septal, inferior and lateral walls.  Overall LVEF is decreased calculated at 51%.   ECHO COMPLETE WO IMAGING ENHANCING AGENT 09/22/2017 Mild concentric LVH, EF 55-60, normal wall motion, mild AI, trivial MR, trivial pericardial effusion   CARDIAC TELEMETRY MONITORING-INTERPRETATION ONLY 10/17/2017  NSR  Occasional PVCs  No significant arrhythmias     History of Present Illness:   Michelle Gonzales is a 81 y.o. female with the above problem list.  She was last seen in Sept 2022 by Dr. Acie Fredrickson. She returns for f/u.  She is here alone.  She was admitted to the hospital in January with hyponatremia in the setting of COPD exacerbation and influenza.  I reviewed her chart and her EKG at that time did demonstrate atrial fibrillation.  Since that time, she has been doing well.  She has not had chest pain, shortness of breath, syncope, orthopnea, leg edema, palpitations.    Past Medical History:  Diagnosis Date   Allergy    Pcn, Sulfa, Codeine   Anxiety    Arthritis    Atrial fibrillation  (HCC) 09/22/2017   Cataract    surgey b/l   COPD (chronic obstructive pulmonary disease) (HCC)    Coronary artery disease involving native coronary artery of native heart without angina pectoris 03/26/2022   Minimal nonobstructive disease // Cath 09/2017: Proximal LAD 20, proximal RCA 20   Cough    Depression    hx years ago   Hordeolum internum right lower eyelid 05/11/2020   Hot flashes    Shortness of breath    Sjogren's disease ([place])    Current Medications: Current Meds  Medication Sig   albuterol (VENTOLIN HFA) 108 (90 Base) MCG/ACT inhaler Inhale 2 puffs into the lungs every 6 (six) hours as needed for wheezing or shortness of breath.   apixaban (ELIQUIS) 5 MG TABS tablet TAKE 1 TABLET BY MOUTH TWICE DAILY   Carboxymethylcellul-Glycerin (LUBRICATING EYE DROPS OP) Place 1 drop into both eyes daily as needed (Dry Eyes).   cholecalciferol (VITAMIN D) 1000 units tablet Take 1,000 Units by mouth daily.    fluticasone (FLONASE) 50 MCG/ACT nasal spray Place 1 spray into both nostrils daily.   metoprolol tartrate (LOPRESSOR) 25 MG tablet Take 1 tablet (25 mg total) by mouth 2 (two) times daily.   Misc Natural Products (OSTEO BI-FLEX TRIPLE STRENGTH PO) Take 1 tablet by mouth daily.   montelukast (SINGULAIR) 10 MG tablet Take 10 mg by mouth at bedtime.    sertraline (ZOLOFT) 50 MG tablet Take 50 mg by mouth daily.   traZODone (DESYREL) 50  MG tablet Take 0.5 tablets (25 mg total) by mouth at bedtime as needed for sleep.   vitamin C (ASCORBIC ACID) 500 MG tablet Take 500 mg by mouth daily.     Allergies:   Demerol [meperidine], Penicillins, Codeine, and Sulfa antibiotics   Social History   Tobacco Use   Smoking status: Every Day    Packs/day: 0.50    Types: Cigarettes    Last attempt to quit: 10/07/2017    Years since quitting: 4.4   Smokeless tobacco: Never  Vaping Use   Vaping Use: Never used  Substance Use Topics   Alcohol use: Not Currently   Drug use: No    Family Hx: The  patient's family history includes Breast cancer in her mother.  Review of Systems  Respiratory:  Negative for hemoptysis.   Gastrointestinal:  Negative for hematochezia and melena.  Genitourinary:  Negative for hematuria.     EKGs/Labs/Other Test Reviewed:    EKG:  EKG is  ordered today.  The ekg ordered today demonstrates atrial fibrillation, HR 88, QTc 438  Recent Labs: 07/20/2021: TSH 1.056 07/24/2021: ALT 24; B Natriuretic Peptide 343.3; Magnesium 1.9 07/26/2021: BUN 9; Creatinine, Ser 0.80; Hemoglobin 13.0; Platelets 236; Potassium 3.7; Sodium 133   Recent Lipid Panel No results for input(s): "CHOL", "TRIG", "HDL", "VLDL", "LDLCALC", "LDLDIRECT" in the last 8760 hours.   Risk Assessment/Calculations/Metrics:    CHA2DS2-VASc Score = 4   This indicates a 4.8% annual risk of stroke. The patient's score is based upon: CHF History: 0 HTN History: 0 Diabetes History: 0 Stroke History: 0 Vascular Disease History: 1 Age Score: 2 Gender Score: 1             Physical Exam:    VS:  BP 110/62   Pulse 88   Ht '5\' 1"'$  (1.549 m)   Wt 149 lb 9.6 oz (67.9 kg)   SpO2 93%   BMI 28.27 kg/m     Wt Readings from Last 3 Encounters:  03/27/22 149 lb 9.6 oz (67.9 kg)  07/25/21 144 lb 10 oz (65.6 kg)  07/19/21 134 lb (60.8 kg)    Constitutional:      Appearance: Healthy appearance. Not in distress.  Neck:     Vascular: JVD normal.  Pulmonary:     Effort: Pulmonary effort is normal.     Breath sounds: No wheezing. No rales.  Cardiovascular:     Normal rate. Irregularly irregular rhythm. Normal S1. Normal S2.      Murmurs: There is no murmur.  Edema:    Peripheral edema absent.  Abdominal:     Palpations: Abdomen is soft.  Skin:    General: Skin is warm and dry.  Neurological:     Mental Status: Alert and oriented to person, place and time.         ASSESSMENT & PLAN:   Paroxysmal atrial fibrillation ([place]) She is in atrial fibrillation today. She is not really  symptomatic.  Heart rate is controlled.  We discussed the benefit of restoring normal sinus rhythm.  I have recommended proceeding with cardioversion.  I reviewed this with Dr. Acie Fredrickson who agreed.  She has been on Eliquis consistently without missing a dose.  Age is <80, weight >60 kg, creatinine <1.5.  She is on the appropriate dose of Eliquis. Arrange cardioversion BMET, CBC today Continue Eliquis 5 mg twice daily, Lopressor 25 mg twice daily Follow-up in the A-fib clinic after cardioversion Follow-up with Dr. Acie Fredrickson or me in 3 months  Coronary artery disease involving native coronary artery of native heart without angina pectoris Minimal nonobstructive disease on cardiac catheterization in 2019.  She is not on antiplatelet therapy as she is on Eliquis.  Lipids are managed by primary care.  She is not having anginal symptoms.        Shared Decision Making/Informed Consent The risks (stroke, cardiac arrhythmias rarely resulting in the need for a temporary or permanent pacemaker, skin irritation or burns and complications associated with conscious sedation including aspiration, arrhythmia, respiratory failure and death), benefits (restoration of normal sinus rhythm) and alternatives of a direct current cardioversion were explained in detail to Ms. Christian and she agrees to proceed.     Dispo:  No follow-ups on file.   Medication Adjustments/Labs and Tests Ordered: Current medicines are reviewed at length with the patient today.  Concerns regarding medicines are outlined above.  Tests Ordered: Orders Placed This Encounter  Procedures   Basic metabolic panel   CBC   EKG 12-Lead   Medication Changes: No orders of the defined types were placed in this encounter.  Signed, Richardson Dopp, PA-C  03/27/2022 12:07 PM    [address], [place], [place]  [address] Phone: [phone]; Fax: [phone]

## 2022-03-26 NOTE — Telephone Encounter (Signed)
Received a fax refill request marked Urgent via Onbase that stated pt has been given a 7 days emergency supply and requesting refills. Fax from [facility]. Pt last seen in 9/22 and given #90 days, but only 2 refills. Will send in new rx at this time-pt has OV scheduled with Richardson Dopp tomorrow.

## 2022-03-27 ENCOUNTER — Ambulatory Visit: Payer: Medicare PPO | Attending: Physician Assistant | Admitting: Physician Assistant

## 2022-03-27 ENCOUNTER — Encounter: Payer: Self-pay | Admitting: Physician Assistant

## 2022-03-27 VITALS — BP 110/62 | HR 88 | Ht 61.0 in | Wt 149.6 lb

## 2022-03-27 DIAGNOSIS — I2510 Atherosclerotic heart disease of native coronary artery without angina pectoris: Secondary | ICD-10-CM

## 2022-03-27 DIAGNOSIS — I480 Paroxysmal atrial fibrillation: Secondary | ICD-10-CM

## 2022-03-27 LAB — CBC
Hematocrit: 40.4 % (ref 34.0–46.6)
Hemoglobin: 13.5 g/dL (ref 11.1–15.9)
MCH: 30.1 pg (ref 26.6–33.0)
MCHC: 33.4 g/dL (ref 31.5–35.7)
MCV: 90 fL (ref 79–97)
Platelets: 177 10*3/uL (ref 150–450)
RBC: 4.48 x10E6/uL (ref 3.77–5.28)
RDW: 14.1 % (ref 11.7–15.4)
WBC: 7.6 10*3/uL (ref 3.4–10.8)

## 2022-03-27 LAB — BASIC METABOLIC PANEL
BUN/Creatinine Ratio: 20 (ref 12–28)
BUN: 16 mg/dL (ref 8–27)
CO2: 29 mmol/L (ref 20–29)
Calcium: 9.7 mg/dL (ref 8.7–10.3)
Chloride: 100 mmol/L (ref 96–106)
Creatinine, Ser: 0.8 mg/dL (ref 0.57–1.00)
Glucose: 105 mg/dL — ABNORMAL HIGH (ref 70–99)
Potassium: 4.9 mmol/L (ref 3.5–5.2)
Sodium: 136 mmol/L (ref 134–144)
eGFR: 75 mL/min/{1.73_m2} (ref >59)

## 2022-03-27 NOTE — Patient Instructions (Addendum)
Medication Instructions:  Your physician recommends that you continue on your current medications as directed. Please refer to the Current Medication list given to you today.  *If you need a refill on your cardiac medications before your next appointment, please call your pharmacy*   Lab Work: TODAY:  BMET & CBC  If you have labs (blood work) drawn today and your tests are completely normal, you will receive your results only by: [place] (if you have MyChart) OR A paper copy in the mail If you have any lab test that is abnormal or we need to change your treatment, we will call you to review the results.   Testing/Procedures: Your physician has recommended that you have a Cardioversion (DCCV). Electrical Cardioversion uses a jolt of electricity to your heart either through paddles or wired patches attached to your chest. This is a controlled, usually prescheduled, procedure. Defibrillation is done under light anesthesia in the hospital, and you usually go home the day of the procedure. This is done to get your heart back into a normal rhythm. You are not awake for the procedure. Please see the instruction sheet given to you BELOW:   You are scheduled for a TEE/Cardioversion/TEE Cardioversion on 04/03/22 with Dr. Harl Bowie.  Please arrive at the [facility] (Main Entrance A) at [facility]: [address] [place], [place] [address] at 8:00 am   DIET: Nothing to eat or drink after midnight except a sip of water with medications (see medication instructions below)  FYI: For your safety, and to allow [place] to monitor your vital signs accurately during the surgery/procedure we request that   if you have artificial nails, gel coating, SNS etc. Please have those removed prior to your surgery/procedure. Not having the nail coverings /polish removed may result in cancellation or delay of your surgery/procedure.   Medication Instructions: Continue your anticoagulant: ELIQUIS You will need  to continue your anticoagulant after your procedure until you  are told by your  Provider that it is safe to stop   Labs: WILL BE DONE TODAY BMET & CBC  You must have a responsible person to drive you home and stay in the waiting area during your procedure. Failure to do so could result in cancellation.  Bring your insurance cards.  *Special Note: Every effort is made to have your procedure done on time. Occasionally there are emergencies that occur at the hospital that may cause delays. Please be patient if a delay does occur.    Follow-Up: At [facility], you and your health needs are our priority.  As part of our continuing mission to provide you with exceptional heart care, we have created designated Provider Care Teams.  These Care Teams include your primary Cardiologist (physician) and Advanced Practice Providers (APPs -  Physician Assistants and Nurse Practitioners) who all work together to provide you with the care you need, when you need it.  We recommend signing up for the patient portal called "MyChart".  Sign up information is provided on this After Visit Summary.  MyChart is used to connect with patients for Virtual Visits (Telemedicine).  Patients are able to view lab/test results, encounter notes, upcoming appointments, etc.  Non-urgent messages can be sent to your provider as well.   To learn more about what you can do with MyChart, go to [email or web address].    Your next appointment:   2 week(s) AFTER CARDIOVERSION WITH THE A-FIB CLINIC  3 MONTHS IN OUR CLINIC   The format for  your next appointment:   In Person  Provider:   Mertie Moores, MD  or Richardson Dopp, PA-C         Other Instructions   Important Information About Sugar

## 2022-03-27 NOTE — Assessment & Plan Note (Addendum)
She is in atrial fibrillation today. She is not really symptomatic.  Heart rate is controlled.  We discussed the benefit of restoring normal sinus rhythm.  I have recommended proceeding with cardioversion.  I reviewed this with Dr. Acie Fredrickson who agreed.  She has been on Eliquis consistently without missing a dose.  Age is <80, weight >60 kg, creatinine <1.5.  She is on the appropriate dose of Eliquis. Arrange cardioversion BMET, CBC today Continue Eliquis 5 mg twice daily, Lopressor 25 mg twice daily Follow-up in the A-fib clinic after cardioversion Follow-up with Dr. Acie Fredrickson or me in 3 months

## 2022-03-27 NOTE — Assessment & Plan Note (Signed)
Minimal nonobstructive disease on cardiac catheterization in 2019.  She is not on antiplatelet therapy as she is on Eliquis.  Lipids are managed by primary care.  She is not having anginal symptoms.

## 2022-03-28 ENCOUNTER — Encounter (INDEPENDENT_AMBULATORY_CARE_PROVIDER_SITE_OTHER): Payer: Self-pay | Admitting: Ophthalmology

## 2022-03-28 ENCOUNTER — Encounter (INDEPENDENT_AMBULATORY_CARE_PROVIDER_SITE_OTHER): Payer: Medicare PPO | Admitting: Ophthalmology

## 2022-03-28 ENCOUNTER — Ambulatory Visit (INDEPENDENT_AMBULATORY_CARE_PROVIDER_SITE_OTHER): Payer: Medicare PPO | Admitting: Ophthalmology

## 2022-03-28 DIAGNOSIS — H353122 Nonexudative age-related macular degeneration, left eye, intermediate dry stage: Secondary | ICD-10-CM

## 2022-03-28 DIAGNOSIS — H43811 Vitreous degeneration, right eye: Secondary | ICD-10-CM | POA: Diagnosis not present

## 2022-03-28 DIAGNOSIS — H35721 Serous detachment of retinal pigment epithelium, right eye: Secondary | ICD-10-CM | POA: Diagnosis not present

## 2022-03-28 DIAGNOSIS — H3570 Unspecified separation of retinal layers: Secondary | ICD-10-CM | POA: Diagnosis not present

## 2022-03-28 DIAGNOSIS — H353211 Exudative age-related macular degeneration, right eye, with active choroidal neovascularization: Secondary | ICD-10-CM

## 2022-03-28 MED ORDER — FARICIMAB-SVOA 6 MG/0.05ML IZ SOLN
6.0000 mg | INTRAVITREAL | Status: AC | PRN
  Administered 2022-03-28: 6 mg via INTRAVITREAL

## 2022-03-28 NOTE — Assessment & Plan Note (Signed)
Physiologic OD

## 2022-03-28 NOTE — Assessment & Plan Note (Signed)
OD improving much less subretinal fluid and improving thickness on Vabysmo and stable acuity.  2 injection #3 today.  Repeat injection today and reevaluate next in 8 weeks after loading dose completed

## 2022-03-28 NOTE — Assessment & Plan Note (Signed)
Component of wet AMD continues

## 2022-03-28 NOTE — Assessment & Plan Note (Signed)
Much less separation of fluid with fluid, on Vabysmo now repeat injection today

## 2022-03-28 NOTE — Progress Notes (Signed)
Pt has been made aware of normal result and verbalized understanding.  jw

## 2022-03-28 NOTE — Progress Notes (Signed)
03/28/2022     CHIEF COMPLAINT Patient presents for  Chief Complaint  Patient presents with   Macular Degeneration      HISTORY OF PRESENT ILLNESS: Michelle Gonzales is a 81 y.o. female who presents to the clinic today for:   HPI   6 WEEKS FOR DILATE OD, VABYSMO OCT. Pt stated vision has remained stable since last visit.  Last edited by Silvestre Moment on 03/28/2022  9:25 AM.      Referring physician: [name], MD [place],  [place] [id number]  HISTORICAL INFORMATION:   Selected notes from the MEDICAL RECORD NUMBER    Lab Results  Component Value Date   HGBA1C 5.7 (H) 09/21/2017     CURRENT MEDICATIONS: Current Outpatient Medications (Ophthalmic Drugs)  Medication Sig   Carboxymethylcellul-Glycerin (LUBRICATING EYE DROPS OP) Place 1 drop into both eyes daily as needed (Dry Eyes).   No current facility-administered medications for this visit. (Ophthalmic Drugs)   Current Outpatient Medications (Other)  Medication Sig   albuterol (VENTOLIN HFA) 108 (90 Base) MCG/ACT inhaler Inhale 2 puffs into the lungs every 6 (six) hours as needed for wheezing or shortness of breath.   apixaban (ELIQUIS) 5 MG TABS tablet TAKE 1 TABLET BY MOUTH TWICE DAILY   cholecalciferol (VITAMIN D) 1000 units tablet Take 1,000 Units by mouth daily.    fluticasone (FLONASE) 50 MCG/ACT nasal spray Place 1 spray into both nostrils daily.   metoprolol tartrate (LOPRESSOR) 25 MG tablet Take 1 tablet (25 mg total) by mouth 2 (two) times daily.   Misc Natural Products (OSTEO BI-FLEX TRIPLE STRENGTH PO) Take 1 tablet by mouth daily.   montelukast (SINGULAIR) 10 MG tablet Take 10 mg by mouth at bedtime.    sertraline (ZOLOFT) 50 MG tablet Take 50 mg by mouth daily.   traZODone (DESYREL) 50 MG tablet Take 0.5 tablets (25 mg total) by mouth at bedtime as needed for sleep.   vitamin C (ASCORBIC ACID) 500 MG tablet Take 500 mg by mouth daily.    No current facility-administered  medications for this visit. (Other)      REVIEW OF SYSTEMS: ROS   Negative for: Constitutional, Gastrointestinal, Neurological, Skin, Genitourinary, Musculoskeletal, HENT, Endocrine, Cardiovascular, Eyes, Respiratory, Psychiatric, Allergic/Imm, Heme/Lymph Last edited by Silvestre Moment on 03/28/2022  9:25 AM.       ALLERGIES Allergies  Allergen Reactions   Demerol [Meperidine] Nausea And Vomiting and Other (See Comments)    Bad headache ,  Nausea/Vomiting.   Penicillins     Has patient had a PCN reaction causing immediate rash, facial/tongue/throat swelling, SOB or lightheadedness with hypotension: YES Has patient had a PCN reaction causing severe rash involving mucus membranes or skin necrosis: NO Has patient had a PCN reaction that required hospitalization: NO Has patient had a PCN reaction occurring within the last 10 years: NO If all of the above answers are "NO", then may proceed with Cephalosporin use.    Codeine Other (See Comments)    Massive headache   Sulfa Antibiotics Itching and Rash    PAST MEDICAL HISTORY Past Medical History:  Diagnosis Date   Allergy    Pcn, Sulfa, Codeine   Anxiety    Arthritis    Atrial fibrillation ([place]) 09/22/2017   Cataract    surgey b/l   COPD (chronic obstructive pulmonary disease) (HCC)    Coronary artery disease involving native coronary artery of native heart without angina pectoris 03/26/2022   Minimal nonobstructive disease //  Cath 09/2017: Proximal LAD 20, proximal RCA 20   Cough    Depression    hx years ago   Hordeolum internum right lower eyelid 05/11/2020   Hot flashes    Shortness of breath    Sjogren's disease ([place])    Past Surgical History:  Procedure Laterality Date   ABDOMINAL HYSTERECTOMY  73   partial   BREAST LUMPECTOMY WITH NEEDLE LOCALIZATION AND AXILLARY SENTINEL LYMPH NODE BX Right 02/01/2013   Procedure: BREAST LUMPECTOMY WITH NEEDLE LOCALIZATION AND AXILLARY SENTINEL LYMPH NODE BX;  Surgeon: Odis Hollingshead,  MD;  Location: [facility];  Service: General;  Laterality: Right;   BREAST SURGERY Right 1965   biopsy   CERVICAL FUSION  2010   CHOLECYSTECTOMY  2002   COLONOSCOPY     DILATION AND CURETTAGE OF UTERUS     LEFT HEART CATH AND CORONARY ANGIOGRAPHY N/A 09/24/2017   Procedure: LEFT HEART CATH AND CORONARY ANGIOGRAPHY;  Surgeon: Leonie Man, MD;  Location: [facility];  Service: Cardiovascular;  Laterality: N/A;   ULTRASOUND GUIDANCE FOR VASCULAR ACCESS Right 09/24/2017   Procedure: Ultrasound Guidance For Vascular Access;  Surgeon: Leonie Man, MD;  Location: [facility];  Service: Cardiovascular;  Laterality: Right;    FAMILY HISTORY Family History  Problem Relation Age of Onset   Breast cancer Mother     SOCIAL HISTORY Social History   Tobacco Use   Smoking status: Every Day    Packs/day: 0.50    Types: Cigarettes    Last attempt to quit: 10/07/2017    Years since quitting: 4.4   Smokeless tobacco: Never  Vaping Use   Vaping Use: Never used  Substance Use Topics   Alcohol use: Not Currently   Drug use: No         OPHTHALMIC EXAM:  Base Eye Exam     Visual Acuity (ETDRS)       Right Left   Dist [place] 20/50 -1 20/60 -1   Dist ph [place] 20/40 20/40         Tonometry (Tonopen, 9:29 AM)       Right Left   Pressure 13 11         Pupils       Pupils APD   Right PERRL None   Left PERRL None         Visual Fields       Left Right    Full Full         Extraocular Movement       Right Left    Full, Ortho Full, Ortho         Neuro/Psych     Oriented x3: Yes   Mood/Affect: Normal         Dilation     Right eye: 2.5% Phenylephrine, 1.0% Mydriacyl @ 9:29 AM           Slit Lamp and Fundus Exam     External Exam       Right Left   External Normal Normal         Slit Lamp Exam       Right Left   Lids/Lashes 1+ Dermatochalasis - upper lid 1+ Dermatochalasis - upper lid   Conjunctiva/Sclera White  and quiet White and quiet   Cornea Clear Clear   Anterior Chamber Deep and quiet Deep and quiet   Iris Round and reactive Round and reactive   Lens Posterior chamber intraocular  lens Posterior chamber intraocular lens   Anterior Vitreous Normal Normal         Fundus Exam       Right Left   Posterior Vitreous Posterior vitreous detachment, Central vitreous floaters    Disc Normal    C/D Ratio 0.4    Macula Subretinal neovascular membrane improved, Retinal pigment epithelial mottling, stable Macular thickening, RPE detachment, no retinal hemorrhage    Vessels Normal    Periphery Normal             IMAGING AND PROCEDURES  Imaging and Procedures for 03/28/22  OCT, Retina - OU - Both Eyes       Right Eye Quality was good. Scan locations included subfoveal. Central Foveal Thickness: 245. Progression has improved. Findings include abnormal foveal contour, subretinal hyper-reflective material, pigment epithelial detachment, subretinal fluid.   Left Eye Quality was good. Scan locations included subfoveal. Central Foveal Thickness: 243. Progression has been stable. Findings include no IRF, no SRF, abnormal foveal contour, retinal drusen .   Notes Much improved subretinal fluid in the FAZ region but still slightly active subfoveal PED in the FAZ region, injection #3 Vabysmo OD  OS no active disease       Intravitreal Injection, Pharmacologic Agent - OD - Right Eye       Time Out 03/28/2022. 10:23 AM. Confirmed correct patient, procedure, site, and patient consented.   Anesthesia Topical anesthesia was used. Anesthetic medications included Lidocaine 4%.   Procedure Preparation included 5% betadine to ocular surface, 10% betadine to eyelids, Tobramycin 0.3%. A 30 gauge needle was used.   Injection: 6 mg faricimab-svoa 6 MG/0.05ML   Route: Intravitreal, Site: Right Eye   NDC: [phone], Lot: [id number], Expiration date: 12/21/2023, Waste: 0 mL   Post-op Post injection  exam found visual acuity of at least counting fingers. The patient tolerated the procedure well. There were no complications. The patient received written and verbal post procedure care education. Post injection medications included ocuflox.              ASSESSMENT/PLAN:  Exudative age-related macular degeneration of right eye with active choroidal neovascularization (HCC) OD improving much less subretinal fluid and improving thickness on Vabysmo and stable acuity.  2 injection #3 today.  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 Exudative age-related macular degeneration of right eye with active choroidal neovascularization (HCC)  H35.3211 OCT, Retina - OU - Both Eyes    Intravitreal Injection, Pharmacologic Agent - OD - Right Eye    faricimab-svoa (VABYSMO) '6mg'$ /0.1m intravitreal injection    2. Intermediate stage nonexudative age-related macular degeneration of left eye  H35.3122     3. Retinal layer separation  H35.70     4. 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 Posterior vitreous detachment of right eye  H43.811       1.  2.  3.  Ophthalmic Meds Ordered this visit:  Meds ordered this encounter  Medications   faricimab-svoa (VABYSMO) '6mg'$ /0.075mintravitreal injection       Return in about 9 weeks (around 05/30/2022)  for dilate, VABYSMO OCT, OD.  There are no Patient Instructions on file for this visit.   Explained the diagnoses, plan, and follow up with the patient and they expressed understanding.  Patient expressed  understanding of the importance of proper follow up care.   Clent Demark Calaya Gildner M.D. Diseases & Surgery of the Retina and Vitreous [facility] 03/28/22     Abbreviations: M myopia (nearsighted); A astigmatism; H hyperopia (farsighted); P presbyopia; Mrx spectacle prescription;  CTL contact lenses; OD right eye; OS left eye; OU both eyes  XT exotropia; ET esotropia; PEK punctate epithelial keratitis; PEE punctate epithelial erosions; DES dry eye syndrome; MGD meibomian gland dysfunction; ATs artificial tears; PFAT's preservative free artificial tears; [place] nuclear sclerotic cataract; PSC posterior subcapsular cataract; ERM epi-retinal membrane; PVD posterior vitreous detachment; RD retinal detachment; DM diabetes mellitus; DR diabetic retinopathy; NPDR non-proliferative diabetic retinopathy; PDR proliferative diabetic retinopathy; CSME clinically significant macular edema; DME diabetic macular edema; dbh dot blot hemorrhages; CWS cotton wool spot; POAG primary open angle glaucoma; C/D cup-to-disc ratio; HVF humphrey visual field; GVF goldmann visual field; OCT optical coherence tomography; IOP intraocular pressure; BRVO Branch retinal vein occlusion; CRVO central retinal vein occlusion; CRAO central retinal artery occlusion; BRAO branch retinal artery occlusion; RT retinal tear; SB scleral buckle; PPV pars plana vitrectomy; VH Vitreous hemorrhage; PRP panretinal laser photocoagulation; IVK intravitreal kenalog; VMT vitreomacular traction; MH Macular hole;  NVD neovascularization of the disc; NVE neovascularization elsewhere; AREDS age related eye disease study; ARMD age related macular degeneration; POAG primary open angle glaucoma; EBMD epithelial/anterior basement membrane dystrophy; ACIOL anterior chamber intraocular lens; IOL intraocular lens; PCIOL posterior chamber intraocular lens; Phaco/IOL phacoemulsification with intraocular lens placement; [place] photorefractive keratectomy; LASIK laser  assisted in situ keratomileusis; HTN hypertension; DM diabetes mellitus; COPD chronic obstructive pulmonary disease

## 2022-03-28 NOTE — Assessment & Plan Note (Signed)
No sign of CNVM OS today

## 2022-04-02 ENCOUNTER — Ambulatory Visit (HOSPITAL_COMMUNITY): Payer: Medicare PPO | Admitting: Anesthesiology

## 2022-04-02 NOTE — Anesthesia Preprocedure Evaluation (Deleted)
Anesthesia Evaluation  Patient identified by MRN, date of birth, ID band Patient awake    Reviewed: Allergy & Precautions, H&P , NPO status , Patient's Chart, lab work & pertinent test results  Airway Mallampati: II  TM Distance: >3 FB Neck ROM: Full    Dental no notable dental hx.    Pulmonary neg pulmonary ROS, shortness of breath, COPD,  COPD inhaler, Current Smoker,    Pulmonary exam normal breath sounds clear to auscultation       Cardiovascular Pt. on home beta blockers + CAD  Normal cardiovascular exam+ dysrhythmias Atrial Fibrillation  Rhythm:Regular Rate:Normal  Cath 3/19 Prox LAD to Mid LAD lesion is 20% stenosed. Prox RCA lesion is 20% stenosed. -Otherwise essentially normal coronary arteries with no culprit lesion to explain abnormal stress test. The left ventricular systolic function is normal. The left ventricular ejection fraction is 55-65% by visual estimate. LV end diastolic pressure is normal. There is mild (2+) mitral regurgitation.    Neuro/Psych Anxiety negative neurological ROS  negative psych ROS   GI/Hepatic negative GI ROS, Neg liver ROS,   Endo/Other  negative endocrine ROS  Renal/GU negative Renal ROS  negative genitourinary   Musculoskeletal negative musculoskeletal ROS (+) Arthritis , Osteoarthritis,    Abdominal   Peds negative pediatric ROS (+)  Hematology negative hematology ROS (+)   Anesthesia Other Findings   Reproductive/Obstetrics negative OB ROS                           Anesthesia Physical  Anesthesia Plan  ASA: 3  Anesthesia Plan: General   Post-op Pain Management: Minimal or no pain anticipated   Induction: Intravenous  PONV Risk Score and Plan: 2 and TIVA  Airway Management Planned: Natural Airway and Simple Face Mask  Additional Equipment: None  Intra-op Plan:   Post-operative Plan: Extubation in OR  Informed Consent: I have  reviewed the patients History and Physical, chart, labs and discussed the procedure including the risks, benefits and alternatives for the proposed anesthesia with the patient or authorized representative who has indicated his/her understanding and acceptance.       Plan Discussed with: CRNA and Anesthesiologist  Anesthesia Plan Comments:        Anesthesia Quick Evaluation

## 2022-04-03 ENCOUNTER — Encounter (HOSPITAL_COMMUNITY)
Admission: RE | Disposition: A | Discharge status: Home / Self Care | Payer: Self-pay | Source: Home / Self Care | Attending: Internal Medicine

## 2022-04-03 ENCOUNTER — Ambulatory Visit (HOSPITAL_COMMUNITY)
Admission: RE | Admit: 2022-04-03 | Discharge: 2022-04-03 | Disposition: A | Discharge status: Home / Self Care | Payer: Medicare PPO | Attending: Internal Medicine | Admitting: Internal Medicine

## 2022-04-03 DIAGNOSIS — Z853 Personal history of malignant neoplasm of breast: Secondary | ICD-10-CM | POA: Diagnosis not present

## 2022-04-03 DIAGNOSIS — F418 Other specified anxiety disorders: Secondary | ICD-10-CM | POA: Diagnosis not present

## 2022-04-03 DIAGNOSIS — I2510 Atherosclerotic heart disease of native coronary artery without angina pectoris: Secondary | ICD-10-CM | POA: Insufficient documentation

## 2022-04-03 DIAGNOSIS — J449 Chronic obstructive pulmonary disease, unspecified: Secondary | ICD-10-CM | POA: Insufficient documentation

## 2022-04-03 DIAGNOSIS — Z7901 Long term (current) use of anticoagulants: Secondary | ICD-10-CM | POA: Diagnosis not present

## 2022-04-03 DIAGNOSIS — M3500 Sicca syndrome, unspecified: Secondary | ICD-10-CM | POA: Insufficient documentation

## 2022-04-03 DIAGNOSIS — I480 Paroxysmal atrial fibrillation: Secondary | ICD-10-CM | POA: Diagnosis not present

## 2022-04-03 DIAGNOSIS — F17210 Nicotine dependence, cigarettes, uncomplicated: Secondary | ICD-10-CM | POA: Insufficient documentation

## 2022-04-03 DIAGNOSIS — Z539 Procedure and treatment not carried out, unspecified reason: Secondary | ICD-10-CM | POA: Insufficient documentation

## 2022-04-03 DIAGNOSIS — Z79899 Other long term (current) drug therapy: Secondary | ICD-10-CM | POA: Diagnosis not present

## 2022-04-03 SURGERY — CANCELLED PROCEDURE
Anesthesia: General

## 2022-04-03 MED ORDER — HYDROCORTISONE 1 % EX CREA: 1.0000 | TOPICAL_CREAM | Freq: Three times a day (TID) | CUTANEOUS | Status: DC | PRN

## 2022-04-03 MED ORDER — SODIUM CHLORIDE 0.9 % IV SOLN: INTRAVENOUS | Status: DC

## 2022-04-03 NOTE — Plan of Care (Signed)
Procedure canceled. Patient in sinus rhythm

## 2022-04-03 NOTE — Progress Notes (Signed)
Outpatient cardioversion cancelled. Pt in NSR. EKG completed and Phineas Inches MD notified.

## 2022-04-03 NOTE — Interval H&P Note (Signed)
History and Physical Interval Note:  04/03/2022 8:20 AM  [name]  has presented today for surgery, with the diagnosis of AFIB.  The various methods of treatment have been discussed with the patient and family. After consideration of risks, benefits and other options for treatment, the patient has consented to  Procedure(s): CARDIOVERSION (N/A) as a surgical intervention.  The patient's history has been reviewed, patient examined, no change in status, stable for surgery.  I have reviewed the patient's chart and labs.  Questions were answered to the patient's satisfaction.     Michelle Gonzales

## 2022-04-17 ENCOUNTER — Ambulatory Visit: Payer: Medicare PPO | Admitting: Physician Assistant

## 2022-04-18 ENCOUNTER — Ambulatory Visit (HOSPITAL_COMMUNITY): Payer: Medicare PPO | Admitting: Physician Assistant

## 2022-05-30 ENCOUNTER — Encounter (INDEPENDENT_AMBULATORY_CARE_PROVIDER_SITE_OTHER): Payer: Medicare PPO | Admitting: Ophthalmology

## 2022-06-24 DIAGNOSIS — H353211 Exudative age-related macular degeneration, right eye, with active choroidal neovascularization: Secondary | ICD-10-CM | POA: Diagnosis not present

## 2022-06-24 DIAGNOSIS — H353122 Nonexudative age-related macular degeneration, left eye, intermediate dry stage: Secondary | ICD-10-CM | POA: Diagnosis not present

## 2022-06-25 DIAGNOSIS — H353122 Nonexudative age-related macular degeneration, left eye, intermediate dry stage: Secondary | ICD-10-CM | POA: Diagnosis not present

## 2022-06-25 DIAGNOSIS — H353221 Exudative age-related macular degeneration, left eye, with active choroidal neovascularization: Secondary | ICD-10-CM | POA: Diagnosis not present

## 2022-06-25 DIAGNOSIS — H353211 Exudative age-related macular degeneration, right eye, with active choroidal neovascularization: Secondary | ICD-10-CM | POA: Diagnosis not present

## 2022-06-27 DIAGNOSIS — R7303 Prediabetes: Secondary | ICD-10-CM | POA: Diagnosis not present

## 2022-06-27 DIAGNOSIS — F320 Major depressive disorder, single episode, mild: Secondary | ICD-10-CM | POA: Diagnosis not present

## 2022-06-27 DIAGNOSIS — F5101 Primary insomnia: Secondary | ICD-10-CM | POA: Diagnosis not present

## 2022-06-27 DIAGNOSIS — E7800 Pure hypercholesterolemia, unspecified: Secondary | ICD-10-CM | POA: Diagnosis not present

## 2022-06-27 DIAGNOSIS — Z853 Personal history of malignant neoplasm of breast: Secondary | ICD-10-CM | POA: Diagnosis not present

## 2022-06-27 DIAGNOSIS — I480 Paroxysmal atrial fibrillation: Secondary | ICD-10-CM | POA: Diagnosis not present

## 2022-06-27 DIAGNOSIS — R300 Dysuria: Secondary | ICD-10-CM | POA: Diagnosis not present

## 2022-06-27 DIAGNOSIS — Z0000 Encounter for general adult medical examination without abnormal findings: Secondary | ICD-10-CM | POA: Diagnosis not present

## 2022-06-27 DIAGNOSIS — J309 Allergic rhinitis, unspecified: Secondary | ICD-10-CM | POA: Diagnosis not present

## 2022-06-27 DIAGNOSIS — E559 Vitamin D deficiency, unspecified: Secondary | ICD-10-CM | POA: Diagnosis not present

## 2022-06-30 ENCOUNTER — Encounter: Payer: Self-pay | Admitting: Cardiovascular Disease

## 2022-06-30 NOTE — Progress Notes (Unsigned)
Cardiology Office Note:    Date:  07/01/2022   ID:  Michelle Gonzales, DOB 1941-02-10, MRN [id number]  PCP:  [name], MD  Cardiologist:  Mertie Moores, MD  Electrophysiologist:  None   Referring MD: [name], MD   Problem list 1.  Atrial fibrillation -paroxysmal 2.  Breast cancer 3.  History of chest pain-heart catheterization in March, 2019 showed minimal CAD   Chief Complaint  Patient presents with   Atrial Fibrillation     Michelle Gonzales is a 81 y.o. female with a hx of atrial fibrillation.  She has been seen by Pecolia Ades, nurse practitioner.  I met her in the hospital in March, 2019. Has started back smoking  ( her husband continued to smoke inside the house )   Had PAF on the event monitor She cannot tell when she is in A. fib versus sinus rhythm.  She denies any chest pain, shortness of breath, or passing out.  Has not been exercising .    Sept. 11, 2020:  Doing well.  No cp or dyspnea.    Walks outside on occasion.   cannot go to the gym  Cannot feel when she is in atrial fib In on Eliquis. CBC and  BMP have been stable  Sept. 13, 2021:  Michelle Gonzales is seen today for follow up of her paroxysmal  atrial fib. Is on eliquis  Has lots of mucus production related to the Diltizem  These may be allergies Will DC and try metoprolol    March 30, 2021: Michelle Gonzales is seen today for follow-up of her paroxysmal atrial fibrillation.  She remains on Eliquis. Has started smoking again .    No CP , no dyspnea Is tolerating the metoprolol better than the Diltiazem   Dec. 11, 2023 Michelle Gonzales is seen for follow up of her PAF, on eliqus Quit smoking in March, 2023  Was sent to the hospital for cardioversion but she was in sinus rhythm at the time.  Cont meds. Will see in a year     Past Medical History:  Diagnosis Date   Allergy    Pcn, Sulfa, Codeine   Anxiety    Arthritis    Atrial fibrillation ([place]) 09/22/2017   Cataract    surgey b/l   COPD  (chronic obstructive pulmonary disease) (HCC)    Coronary artery disease involving native coronary artery of native heart without angina pectoris 03/26/2022   Minimal nonobstructive disease // Cath 09/2017: Proximal LAD 20, proximal RCA 20   Cough    Depression    hx years ago   Hordeolum internum right lower eyelid 05/11/2020   Hot flashes    Shortness of breath    Sjogren's disease ([place])     Past Surgical History:  Procedure Laterality Date   ABDOMINAL HYSTERECTOMY  73   partial   BREAST LUMPECTOMY WITH NEEDLE LOCALIZATION AND AXILLARY SENTINEL LYMPH NODE BX Right 02/01/2013   Procedure: BREAST LUMPECTOMY WITH NEEDLE LOCALIZATION AND AXILLARY SENTINEL LYMPH NODE BX;  Surgeon: Odis Hollingshead, MD;  Location: [facility];  Service: General;  Laterality: Right;   BREAST SURGERY Right 1965   biopsy   CERVICAL FUSION  2010   CHOLECYSTECTOMY  2002   COLONOSCOPY     DILATION AND CURETTAGE OF UTERUS     LEFT HEART CATH AND CORONARY ANGIOGRAPHY N/A 09/24/2017   Procedure: LEFT HEART CATH AND CORONARY ANGIOGRAPHY;  Surgeon: Leonie Man, MD;  Location: [facility];  Service: Cardiovascular;  Laterality: N/A;   ULTRASOUND GUIDANCE FOR VASCULAR ACCESS Right 09/24/2017   Procedure: Ultrasound Guidance For Vascular Access;  Surgeon: Leonie Man, MD;  Location: [facility];  Service: Cardiovascular;  Laterality: Right;    Current Medications: Current Meds  Medication Sig   albuterol (VENTOLIN HFA) 108 (90 Base) MCG/ACT inhaler Inhale 2 puffs into the lungs every 6 (six) hours as needed for wheezing or shortness of breath.   apixaban (ELIQUIS) 5 MG TABS tablet TAKE 1 TABLET BY MOUTH TWICE DAILY   Carboxymethylcellul-Glycerin (LUBRICATING EYE DROPS OP) Place 1 drop into both eyes daily as needed (Dry Eyes).   cholecalciferol (VITAMIN D) 1000 units tablet Take 1,000 Units by mouth daily.    fluticasone (FLONASE) 50 MCG/ACT nasal spray Place 1 spray into both  nostrils daily.   metoprolol tartrate (LOPRESSOR) 25 MG tablet Take 1 tablet (25 mg total) by mouth 2 (two) times daily.   Misc Natural Products (OSTEO BI-FLEX TRIPLE STRENGTH PO) Take 1 tablet by mouth daily.   montelukast (SINGULAIR) 10 MG tablet Take 10 mg by mouth at bedtime.    sertraline (ZOLOFT) 50 MG tablet Take 50 mg by mouth daily.   traZODone (DESYREL) 50 MG tablet Take 0.5 tablets (25 mg total) by mouth at bedtime as needed for sleep.   vitamin C (ASCORBIC ACID) 500 MG tablet Take 500 mg by mouth daily.      Allergies:   Demerol [meperidine], Penicillins, Codeine, and Sulfa antibiotics   Social History   Socioeconomic History   Marital status: Married    Spouse name: Not on file   Number of children: Not on file   Years of education: Not on file   Highest education level: Not on file  Occupational History   Not on file  Tobacco Use   Smoking status: Every Day    Packs/day: 0.50    Types: Cigarettes    Last attempt to quit: 10/07/2017    Years since quitting: 4.7   Smokeless tobacco: Never  Vaping Use   Vaping Use: Never used  Substance and Sexual Activity   Alcohol use: Not Currently   Drug use: No   Sexual activity: Yes  Other Topics Concern   Not on file  Social History Narrative   Not on file   Social Determinants of Health   Financial Resource Strain: Not on file  Food Insecurity: Not on file  Transportation Needs: Not on file  Physical Activity: Not on file  Stress: Not on file  Social Connections: Not on file     Family History: The patient's family history includes Breast cancer in her mother.  ROS:   Please see the history of present illness.     All other systems reviewed and are negative.  EKGs/Labs/Other Studies Reviewed:    The following studies were reviewed today:    Recent Labs: 07/20/2021: TSH 1.056 07/24/2021: ALT 24; B Natriuretic Peptide 343.3; Magnesium 1.9 03/27/2022: BUN 16; Creatinine, Ser 0.80; Hemoglobin 13.5; Platelets  177; Potassium 4.9; Sodium 136  Recent Lipid Panel    Component Value Date/Time   CHOL 171 09/21/2017 1001   TRIG 117 09/21/2017 1001   HDL 53 09/21/2017 1001   CHOLHDL 3.2 09/21/2017 1001   VLDL 23 09/21/2017 1001   LDLCALC 95 09/21/2017 1001    Physical Exam: Blood pressure 134/80, pulse 82, height 5' (1.524 m), weight 152 lb 9.6 oz (69.2 kg), SpO2 95 %.       GEN:  Well nourished, well developed in no acute distress HEENT: Normal NECK: No JVD; No carotid bruits LYMPHATICS: No lymphadenopathy CARDIAC: RRR , no murmurs, rubs, gallops RESPIRATORY:  Clear to auscultation without rales, wheezing or rhonchi  ABDOMEN: Soft, non-tender, non-distended MUSCULOSKELETAL:  No edema; No deformity  SKIN: Warm and dry NEUROLOGIC:  Alert and oriented x 3    EKG:    April 03, 2022: Normal sinus rhythm at 62.  Nonspecific ST and T wave changes.   ASSESSMENT:    1. Paroxysmal atrial fibrillation (HCC)   2. Coronary artery disease involving native coronary artery of native heart without angina pectoris      PLAN:       Paroxsymal atrial fib :     She is maintaining sinus rhythm.  Continue current medications.  Continue Eliquis.   2.  Mild coronary artery disease: She has stopped smoking.  She is not having any angina.  She overall is doing well.      Medication Adjustments/Labs and Tests Ordered: Current medicines are reviewed at length with the patient today.  Concerns regarding medicines are outlined above.  No orders of the defined types were placed in this encounter.   No orders of the defined types were placed in this encounter.    Patient Instructions  Medication Instructions:  Your physician recommends that you continue on your current medications as directed. Please refer to the Current Medication list given to you today.  *If you need a refill on your cardiac medications before your next appointment, please call your pharmacy*   Lab Work: NONE If  you have labs (blood work) drawn today and your tests are completely normal, you will receive your results only by: [place] (if you have MyChart) OR A paper copy in the mail If you have any lab test that is abnormal or we need to change your treatment, we will call you to review the results.   Testing/Procedures: NONE   Follow-Up: At [facility], you and your health needs are our priority.  As part of our continuing mission to provide you with exceptional heart care, we have created designated Provider Care Teams.  These Care Teams include your primary Cardiologist (physician) and Advanced Practice Providers (APPs -  Physician Assistants and Nurse Practitioners) who all work together to provide you with the care you need, when you need it.  We recommend signing up for the patient portal called "MyChart".  Sign up information is provided on this After Visit Summary.  MyChart is used to connect with patients for Virtual Visits (Telemedicine).  Patients are able to view lab/test results, encounter notes, upcoming appointments, etc.  Non-urgent messages can be sent to your provider as well.   To learn more about what you can do with MyChart, go to [email or web address].    Your next appointment:   1 year(s)  The format for your next appointment:   In Person  Provider:   Mertie Moores, MD       Important Information About Sugar         Signed, Mertie Moores, MD  07/01/2022 9:52 AM    [place]

## 2022-07-01 ENCOUNTER — Encounter: Payer: Self-pay | Admitting: Cardiovascular Disease

## 2022-07-01 ENCOUNTER — Ambulatory Visit: Payer: Medicare PPO | Attending: Cardiovascular Disease | Admitting: Cardiovascular Disease

## 2022-07-01 VITALS — BP 134/80 | HR 82 | Ht 60.0 in | Wt 152.6 lb

## 2022-07-01 DIAGNOSIS — I2510 Atherosclerotic heart disease of native coronary artery without angina pectoris: Secondary | ICD-10-CM

## 2022-07-01 DIAGNOSIS — I480 Paroxysmal atrial fibrillation: Secondary | ICD-10-CM

## 2022-07-01 NOTE — Patient Instructions (Signed)
Medication Instructions:  Your physician recommends that you continue on your current medications as directed. Please refer to the Current Medication list given to you today.  *If you need a refill on your cardiac medications before your next appointment, please call your pharmacy*   Lab Work: NONE If you have labs (blood work) drawn today and your tests are completely normal, you will receive your results only by: MyChart Message (if you have MyChart) OR A paper copy in the mail If you have any lab test that is abnormal or we need to change your treatment, we will call you to review the results.   Testing/Procedures: NONE   Follow-Up: At [facility], you and your health needs are our priority.  As part of our continuing mission to provide you with exceptional heart care, we have created designated Provider Care Teams.  These Care Teams include your primary Cardiologist (physician) and Advanced Practice Providers (APPs -  Physician Assistants and Nurse Practitioners) who all work together to provide you with the care you need, when you need it.  We recommend signing up for the patient portal called "MyChart".  Sign up information is provided on this After Visit Summary.  MyChart is used to connect with patients for Virtual Visits (Telemedicine).  Patients are able to view lab/test results, encounter notes, upcoming appointments, etc.  Non-urgent messages can be sent to your provider as well.   To learn more about what you can do with MyChart, go to [email or web address].    Your next appointment:   1 year(s)  The format for your next appointment:   In Person  Provider:   Philip [name], MD       Important Information About Sugar       

## 2022-07-30 DIAGNOSIS — H43811 Vitreous degeneration, right eye: Secondary | ICD-10-CM | POA: Diagnosis not present

## 2022-07-30 DIAGNOSIS — H35051 Retinal neovascularization, unspecified, right eye: Secondary | ICD-10-CM | POA: Diagnosis not present

## 2022-07-30 DIAGNOSIS — H353211 Exudative age-related macular degeneration, right eye, with active choroidal neovascularization: Secondary | ICD-10-CM | POA: Diagnosis not present

## 2022-07-30 DIAGNOSIS — H353221 Exudative age-related macular degeneration, left eye, with active choroidal neovascularization: Secondary | ICD-10-CM | POA: Diagnosis not present

## 2022-07-30 DIAGNOSIS — H353132 Nonexudative age-related macular degeneration, bilateral, intermediate dry stage: Secondary | ICD-10-CM | POA: Diagnosis not present

## 2022-07-30 DIAGNOSIS — H353122 Nonexudative age-related macular degeneration, left eye, intermediate dry stage: Secondary | ICD-10-CM | POA: Diagnosis not present

## 2022-07-30 DIAGNOSIS — H353112 Nonexudative age-related macular degeneration, right eye, intermediate dry stage: Secondary | ICD-10-CM | POA: Diagnosis not present

## 2022-08-06 IMAGING — DX DG CHEST 1V PORT
1 series · 1 of 1 positions shown · non-contrast
Comparison: Two-view chest x-ray 05/28/2019

CLINICAL DATA: Cough and shortness of breath. Patient was diagnosed
with pneumonia at walk-in clinic.

EXAM:
PORTABLE CHEST 1 VIEW

[chest ap]
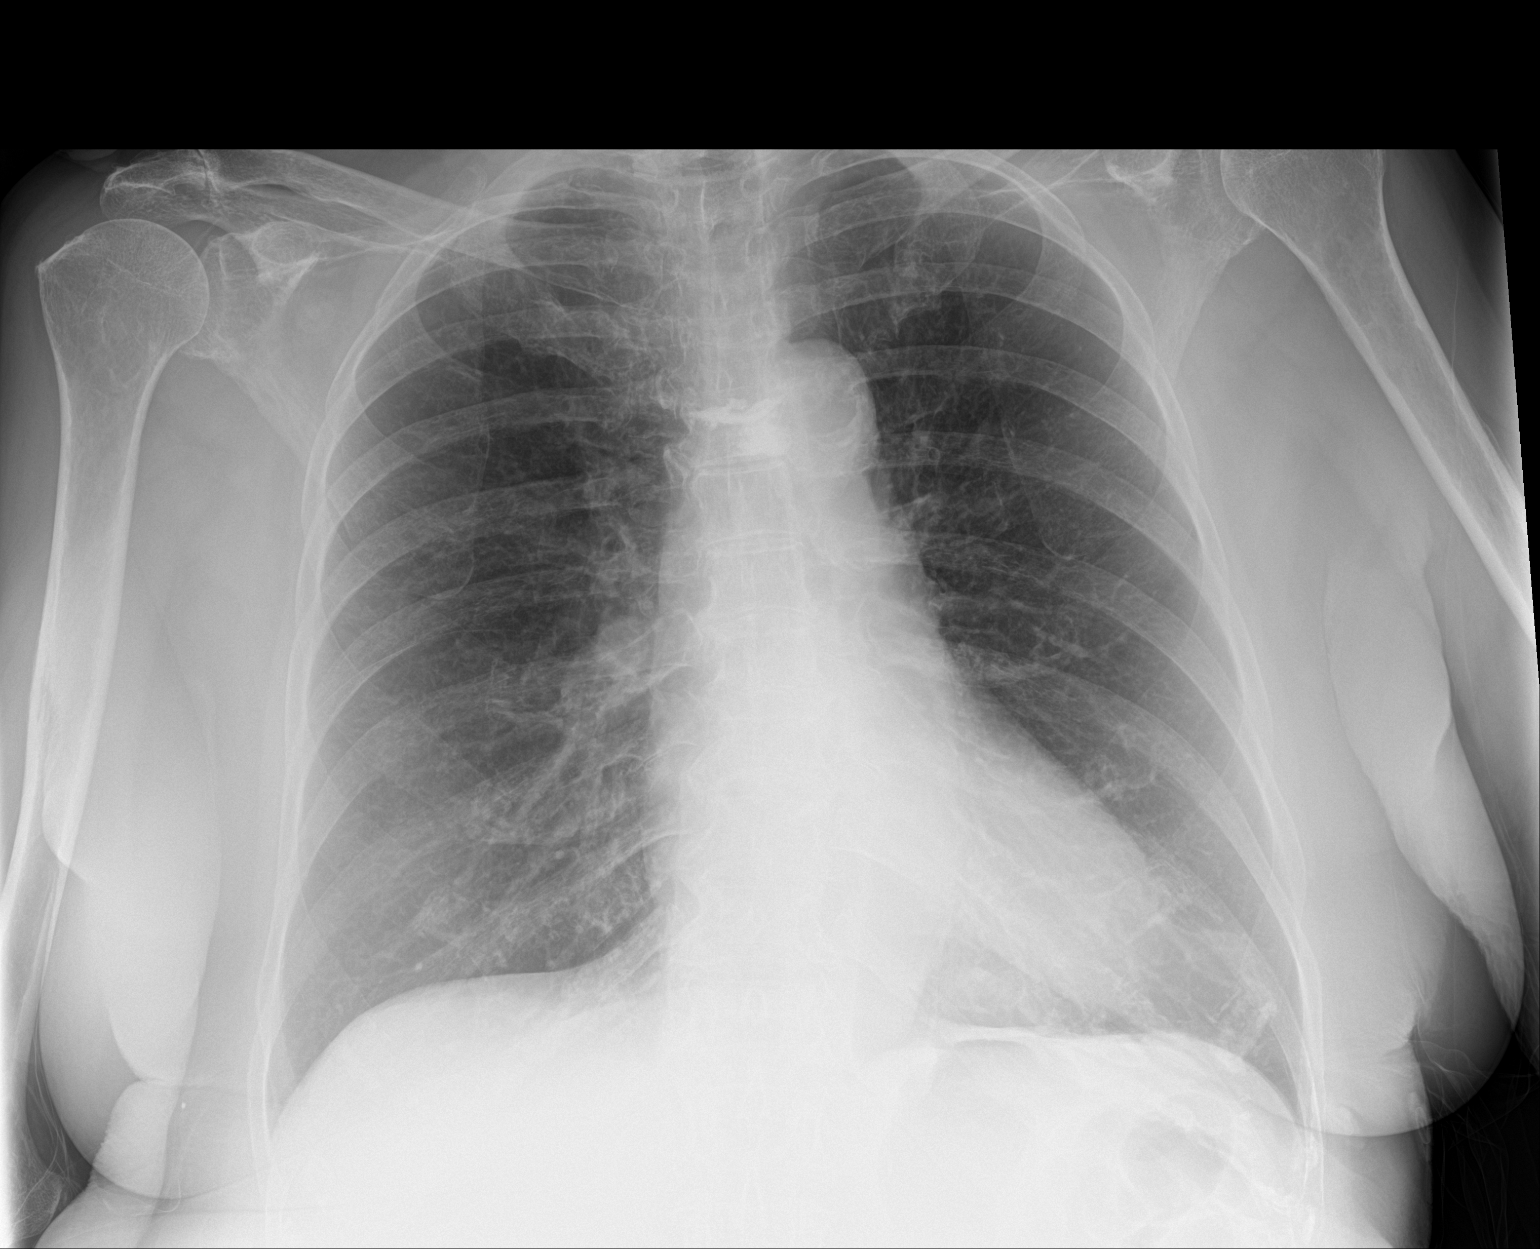

[1 of 1 positions shown; findings below may reference images not displayed]

FINDINGS: Heart size is normal. Atherosclerotic changes are present at the
aortic arch. Lungs are clear. No edema or effusion is present. No
focal airspace disease present. Cervical spine fusion noted. Upper
thoracic spinal augmentation noted.
IMPRESSION: 1. No acute cardiopulmonary disease.
2. Atherosclerosis.

## 2022-09-03 DIAGNOSIS — H3321 Serous retinal detachment, right eye: Secondary | ICD-10-CM | POA: Diagnosis not present

## 2022-09-03 DIAGNOSIS — H353221 Exudative age-related macular degeneration, left eye, with active choroidal neovascularization: Secondary | ICD-10-CM | POA: Diagnosis not present

## 2022-09-03 DIAGNOSIS — H43811 Vitreous degeneration, right eye: Secondary | ICD-10-CM | POA: Diagnosis not present

## 2022-09-17 DIAGNOSIS — H35051 Retinal neovascularization, unspecified, right eye: Secondary | ICD-10-CM | POA: Diagnosis not present

## 2022-09-17 DIAGNOSIS — H43811 Vitreous degeneration, right eye: Secondary | ICD-10-CM | POA: Diagnosis not present

## 2022-09-17 DIAGNOSIS — H3321 Serous retinal detachment, right eye: Secondary | ICD-10-CM | POA: Diagnosis not present

## 2022-09-17 DIAGNOSIS — H353211 Exudative age-related macular degeneration, right eye, with active choroidal neovascularization: Secondary | ICD-10-CM | POA: Diagnosis not present

## 2022-10-01 ENCOUNTER — Other Ambulatory Visit: Payer: Self-pay | Admitting: Cardiovascular Disease

## 2022-10-01 DIAGNOSIS — I480 Paroxysmal atrial fibrillation: Secondary | ICD-10-CM

## 2022-10-01 NOTE — Telephone Encounter (Signed)
Eliquis '5mg'$  refill request received. Patient is 82 years old, weight-69.2kg, Crea-0.80 on 03/27/22, Diagnosis-Afib, and last seen by Dr. Acie Fredrickson on 07/01/22. Dose is appropriate based on dosing criteria. Will send in refill to requested pharmacy.

## 2022-10-08 DIAGNOSIS — H353122 Nonexudative age-related macular degeneration, left eye, intermediate dry stage: Secondary | ICD-10-CM | POA: Diagnosis not present

## 2022-10-08 DIAGNOSIS — H353112 Nonexudative age-related macular degeneration, right eye, intermediate dry stage: Secondary | ICD-10-CM | POA: Diagnosis not present

## 2022-10-08 DIAGNOSIS — H353231 Exudative age-related macular degeneration, bilateral, with active choroidal neovascularization: Secondary | ICD-10-CM | POA: Diagnosis not present

## 2022-10-08 DIAGNOSIS — H35051 Retinal neovascularization, unspecified, right eye: Secondary | ICD-10-CM | POA: Diagnosis not present

## 2022-10-08 DIAGNOSIS — H3322 Serous retinal detachment, left eye: Secondary | ICD-10-CM | POA: Diagnosis not present

## 2022-11-12 DIAGNOSIS — H3323 Serous retinal detachment, bilateral: Secondary | ICD-10-CM | POA: Diagnosis not present

## 2022-11-12 DIAGNOSIS — H353132 Nonexudative age-related macular degeneration, bilateral, intermediate dry stage: Secondary | ICD-10-CM | POA: Diagnosis not present

## 2022-11-12 DIAGNOSIS — H353211 Exudative age-related macular degeneration, right eye, with active choroidal neovascularization: Secondary | ICD-10-CM | POA: Diagnosis not present

## 2022-11-12 DIAGNOSIS — H35051 Retinal neovascularization, unspecified, right eye: Secondary | ICD-10-CM | POA: Diagnosis not present

## 2022-11-12 DIAGNOSIS — H353221 Exudative age-related macular degeneration, left eye, with active choroidal neovascularization: Secondary | ICD-10-CM | POA: Diagnosis not present

## 2022-11-12 DIAGNOSIS — H43811 Vitreous degeneration, right eye: Secondary | ICD-10-CM | POA: Diagnosis not present

## 2022-11-14 DIAGNOSIS — H353211 Exudative age-related macular degeneration, right eye, with active choroidal neovascularization: Secondary | ICD-10-CM | POA: Diagnosis not present

## 2022-11-14 DIAGNOSIS — H353132 Nonexudative age-related macular degeneration, bilateral, intermediate dry stage: Secondary | ICD-10-CM | POA: Diagnosis not present

## 2022-11-14 DIAGNOSIS — H43811 Vitreous degeneration, right eye: Secondary | ICD-10-CM | POA: Diagnosis not present

## 2022-11-14 DIAGNOSIS — H3323 Serous retinal detachment, bilateral: Secondary | ICD-10-CM | POA: Diagnosis not present

## 2022-11-14 DIAGNOSIS — H353221 Exudative age-related macular degeneration, left eye, with active choroidal neovascularization: Secondary | ICD-10-CM | POA: Diagnosis not present

## 2022-12-18 DIAGNOSIS — H3322 Serous retinal detachment, left eye: Secondary | ICD-10-CM | POA: Diagnosis not present

## 2022-12-18 DIAGNOSIS — H353221 Exudative age-related macular degeneration, left eye, with active choroidal neovascularization: Secondary | ICD-10-CM | POA: Diagnosis not present

## 2022-12-18 DIAGNOSIS — H3323 Serous retinal detachment, bilateral: Secondary | ICD-10-CM | POA: Diagnosis not present

## 2022-12-18 DIAGNOSIS — H353132 Nonexudative age-related macular degeneration, bilateral, intermediate dry stage: Secondary | ICD-10-CM | POA: Diagnosis not present

## 2022-12-18 DIAGNOSIS — H43811 Vitreous degeneration, right eye: Secondary | ICD-10-CM | POA: Diagnosis not present

## 2022-12-18 DIAGNOSIS — H353211 Exudative age-related macular degeneration, right eye, with active choroidal neovascularization: Secondary | ICD-10-CM | POA: Diagnosis not present

## 2022-12-18 DIAGNOSIS — H3321 Serous retinal detachment, right eye: Secondary | ICD-10-CM | POA: Diagnosis not present

## 2022-12-23 DIAGNOSIS — H3323 Serous retinal detachment, bilateral: Secondary | ICD-10-CM | POA: Diagnosis not present

## 2022-12-23 DIAGNOSIS — H353221 Exudative age-related macular degeneration, left eye, with active choroidal neovascularization: Secondary | ICD-10-CM | POA: Diagnosis not present

## 2022-12-23 DIAGNOSIS — H43811 Vitreous degeneration, right eye: Secondary | ICD-10-CM | POA: Diagnosis not present

## 2022-12-23 DIAGNOSIS — H353132 Nonexudative age-related macular degeneration, bilateral, intermediate dry stage: Secondary | ICD-10-CM | POA: Diagnosis not present

## 2022-12-30 DIAGNOSIS — L814 Other melanin hyperpigmentation: Secondary | ICD-10-CM | POA: Diagnosis not present

## 2022-12-30 DIAGNOSIS — L570 Actinic keratosis: Secondary | ICD-10-CM | POA: Diagnosis not present

## 2022-12-30 DIAGNOSIS — L821 Other seborrheic keratosis: Secondary | ICD-10-CM | POA: Diagnosis not present

## 2022-12-31 DIAGNOSIS — E871 Hypo-osmolality and hyponatremia: Secondary | ICD-10-CM | POA: Diagnosis not present

## 2022-12-31 DIAGNOSIS — I480 Paroxysmal atrial fibrillation: Secondary | ICD-10-CM | POA: Diagnosis not present

## 2022-12-31 DIAGNOSIS — E7800 Pure hypercholesterolemia, unspecified: Secondary | ICD-10-CM | POA: Diagnosis not present

## 2022-12-31 DIAGNOSIS — M3500 Sicca syndrome, unspecified: Secondary | ICD-10-CM | POA: Diagnosis not present

## 2022-12-31 DIAGNOSIS — F5101 Primary insomnia: Secondary | ICD-10-CM | POA: Diagnosis not present

## 2022-12-31 DIAGNOSIS — J449 Chronic obstructive pulmonary disease, unspecified: Secondary | ICD-10-CM | POA: Diagnosis not present

## 2022-12-31 DIAGNOSIS — D6869 Other thrombophilia: Secondary | ICD-10-CM | POA: Diagnosis not present

## 2022-12-31 DIAGNOSIS — R7303 Prediabetes: Secondary | ICD-10-CM | POA: Diagnosis not present

## 2022-12-31 DIAGNOSIS — F320 Major depressive disorder, single episode, mild: Secondary | ICD-10-CM | POA: Diagnosis not present

## 2023-01-02 ENCOUNTER — Other Ambulatory Visit: Payer: Self-pay | Admitting: Cardiovascular Disease

## 2023-01-27 DIAGNOSIS — L820 Inflamed seborrheic keratosis: Secondary | ICD-10-CM | POA: Diagnosis not present

## 2023-01-27 DIAGNOSIS — L905 Scar conditions and fibrosis of skin: Secondary | ICD-10-CM | POA: Diagnosis not present

## 2023-01-27 DIAGNOSIS — L570 Actinic keratosis: Secondary | ICD-10-CM | POA: Diagnosis not present

## 2023-01-27 DIAGNOSIS — L821 Other seborrheic keratosis: Secondary | ICD-10-CM | POA: Diagnosis not present

## 2023-01-27 DIAGNOSIS — L814 Other melanin hyperpigmentation: Secondary | ICD-10-CM | POA: Diagnosis not present

## 2023-01-29 DIAGNOSIS — Z853 Personal history of malignant neoplasm of breast: Secondary | ICD-10-CM | POA: Diagnosis not present

## 2023-01-29 DIAGNOSIS — N958 Other specified menopausal and perimenopausal disorders: Secondary | ICD-10-CM | POA: Diagnosis not present

## 2023-01-29 DIAGNOSIS — Z1231 Encounter for screening mammogram for malignant neoplasm of breast: Secondary | ICD-10-CM | POA: Diagnosis not present

## 2023-01-29 DIAGNOSIS — M8588 Other specified disorders of bone density and structure, other site: Secondary | ICD-10-CM | POA: Diagnosis not present

## 2023-02-12 DIAGNOSIS — H353221 Exudative age-related macular degeneration, left eye, with active choroidal neovascularization: Secondary | ICD-10-CM | POA: Diagnosis not present

## 2023-02-12 DIAGNOSIS — H35051 Retinal neovascularization, unspecified, right eye: Secondary | ICD-10-CM | POA: Diagnosis not present

## 2023-02-12 DIAGNOSIS — H353122 Nonexudative age-related macular degeneration, left eye, intermediate dry stage: Secondary | ICD-10-CM | POA: Diagnosis not present

## 2023-02-12 DIAGNOSIS — H43811 Vitreous degeneration, right eye: Secondary | ICD-10-CM | POA: Diagnosis not present

## 2023-02-12 DIAGNOSIS — H353211 Exudative age-related macular degeneration, right eye, with active choroidal neovascularization: Secondary | ICD-10-CM | POA: Diagnosis not present

## 2023-02-12 DIAGNOSIS — H353112 Nonexudative age-related macular degeneration, right eye, intermediate dry stage: Secondary | ICD-10-CM | POA: Diagnosis not present

## 2023-02-12 DIAGNOSIS — H3323 Serous retinal detachment, bilateral: Secondary | ICD-10-CM | POA: Diagnosis not present

## 2023-02-17 DIAGNOSIS — H353112 Nonexudative age-related macular degeneration, right eye, intermediate dry stage: Secondary | ICD-10-CM | POA: Diagnosis not present

## 2023-02-17 DIAGNOSIS — H35051 Retinal neovascularization, unspecified, right eye: Secondary | ICD-10-CM | POA: Diagnosis not present

## 2023-02-17 DIAGNOSIS — H3323 Serous retinal detachment, bilateral: Secondary | ICD-10-CM | POA: Diagnosis not present

## 2023-02-17 DIAGNOSIS — H353122 Nonexudative age-related macular degeneration, left eye, intermediate dry stage: Secondary | ICD-10-CM | POA: Diagnosis not present

## 2023-02-17 DIAGNOSIS — H43811 Vitreous degeneration, right eye: Secondary | ICD-10-CM | POA: Diagnosis not present

## 2023-02-17 DIAGNOSIS — H353221 Exudative age-related macular degeneration, left eye, with active choroidal neovascularization: Secondary | ICD-10-CM | POA: Diagnosis not present

## 2023-03-26 DIAGNOSIS — H35051 Retinal neovascularization, unspecified, right eye: Secondary | ICD-10-CM | POA: Diagnosis not present

## 2023-03-26 DIAGNOSIS — H3323 Serous retinal detachment, bilateral: Secondary | ICD-10-CM | POA: Diagnosis not present

## 2023-03-26 DIAGNOSIS — H353132 Nonexudative age-related macular degeneration, bilateral, intermediate dry stage: Secondary | ICD-10-CM | POA: Diagnosis not present

## 2023-03-26 DIAGNOSIS — H43811 Vitreous degeneration, right eye: Secondary | ICD-10-CM | POA: Diagnosis not present

## 2023-03-26 DIAGNOSIS — H353211 Exudative age-related macular degeneration, right eye, with active choroidal neovascularization: Secondary | ICD-10-CM | POA: Diagnosis not present

## 2023-04-14 DIAGNOSIS — H353231 Exudative age-related macular degeneration, bilateral, with active choroidal neovascularization: Secondary | ICD-10-CM | POA: Diagnosis not present

## 2023-04-14 DIAGNOSIS — H3323 Serous retinal detachment, bilateral: Secondary | ICD-10-CM | POA: Diagnosis not present

## 2023-04-14 DIAGNOSIS — H353122 Nonexudative age-related macular degeneration, left eye, intermediate dry stage: Secondary | ICD-10-CM | POA: Diagnosis not present

## 2023-04-14 DIAGNOSIS — H35051 Retinal neovascularization, unspecified, right eye: Secondary | ICD-10-CM | POA: Diagnosis not present

## 2023-04-14 DIAGNOSIS — H43811 Vitreous degeneration, right eye: Secondary | ICD-10-CM | POA: Diagnosis not present

## 2023-04-22 DIAGNOSIS — K1329 Other disturbances of oral epithelium, including tongue: Secondary | ICD-10-CM | POA: Diagnosis not present

## 2023-05-06 ENCOUNTER — Other Ambulatory Visit: Payer: Self-pay | Admitting: *Deleted

## 2023-05-06 DIAGNOSIS — I480 Paroxysmal atrial fibrillation: Secondary | ICD-10-CM

## 2023-05-06 MED ORDER — APIXABAN 5 MG PO TABS: 5.0000 mg | ORAL_TABLET | Freq: Two times a day (BID) | ORAL | 5 refills | Status: DC

## 2023-05-06 NOTE — Telephone Encounter (Signed)
Eliquis 5mg  refill request received. Patient is 82 years old, weight-69.2kg, Crea-0.93 on 01/02/23 via [facility] form [facility], Diagnosis-Afib, and last seen by Dr. Elease Hashimoto on 07/01/22 & has an appt with Dayna on 06/22/23. Dose is appropriate based on dosing criteria. Will send in refill to requested pharmacy.     Pharmacy requested: [facility] however the fax numbers are different & can't be changed. So will have to have rx sent to [facility] for Dr. Elease Hashimoto to sign and then fax manually to [phone] for the pharmacy.

## 2023-05-07 DIAGNOSIS — H35051 Retinal neovascularization, unspecified, right eye: Secondary | ICD-10-CM | POA: Diagnosis not present

## 2023-05-07 DIAGNOSIS — H353211 Exudative age-related macular degeneration, right eye, with active choroidal neovascularization: Secondary | ICD-10-CM | POA: Diagnosis not present

## 2023-05-07 DIAGNOSIS — H353132 Nonexudative age-related macular degeneration, bilateral, intermediate dry stage: Secondary | ICD-10-CM | POA: Diagnosis not present

## 2023-05-07 DIAGNOSIS — H43811 Vitreous degeneration, right eye: Secondary | ICD-10-CM | POA: Diagnosis not present

## 2023-05-07 DIAGNOSIS — H3323 Serous retinal detachment, bilateral: Secondary | ICD-10-CM | POA: Diagnosis not present

## 2023-05-20 DIAGNOSIS — L820 Inflamed seborrheic keratosis: Secondary | ICD-10-CM | POA: Diagnosis not present

## 2023-05-20 DIAGNOSIS — L814 Other melanin hyperpigmentation: Secondary | ICD-10-CM | POA: Diagnosis not present

## 2023-05-20 DIAGNOSIS — L821 Other seborrheic keratosis: Secondary | ICD-10-CM | POA: Diagnosis not present

## 2023-05-20 DIAGNOSIS — L570 Actinic keratosis: Secondary | ICD-10-CM | POA: Diagnosis not present

## 2023-06-09 DIAGNOSIS — H353132 Nonexudative age-related macular degeneration, bilateral, intermediate dry stage: Secondary | ICD-10-CM | POA: Diagnosis not present

## 2023-06-09 DIAGNOSIS — H3323 Serous retinal detachment, bilateral: Secondary | ICD-10-CM | POA: Diagnosis not present

## 2023-06-09 DIAGNOSIS — H353221 Exudative age-related macular degeneration, left eye, with active choroidal neovascularization: Secondary | ICD-10-CM | POA: Diagnosis not present

## 2023-06-09 DIAGNOSIS — H43811 Vitreous degeneration, right eye: Secondary | ICD-10-CM | POA: Diagnosis not present

## 2023-06-09 DIAGNOSIS — H35051 Retinal neovascularization, unspecified, right eye: Secondary | ICD-10-CM | POA: Diagnosis not present

## 2023-06-17 DIAGNOSIS — H353112 Nonexudative age-related macular degeneration, right eye, intermediate dry stage: Secondary | ICD-10-CM | POA: Diagnosis not present

## 2023-06-17 DIAGNOSIS — H353211 Exudative age-related macular degeneration, right eye, with active choroidal neovascularization: Secondary | ICD-10-CM | POA: Diagnosis not present

## 2023-06-17 DIAGNOSIS — H35051 Retinal neovascularization, unspecified, right eye: Secondary | ICD-10-CM | POA: Diagnosis not present

## 2023-06-17 DIAGNOSIS — H3323 Serous retinal detachment, bilateral: Secondary | ICD-10-CM | POA: Diagnosis not present

## 2023-06-17 DIAGNOSIS — H43811 Vitreous degeneration, right eye: Secondary | ICD-10-CM | POA: Diagnosis not present

## 2023-06-22 NOTE — Progress Notes (Unsigned)
   Cardiology Office Note    Date:  06/22/2023  ID:  Michelle Gonzales, DOB June 03, 1941, MRN [id number] PCP:  Noberto Retort, MD  Cardiologist:  Kristeen Miss, MD  Electrophysiologist:  None   Chief Complaint: ***  History of Present Illness: .    Michelle Gonzales is a 82 y.o. female with visit-pertinent history of PAF, breast CA s/p surgery,  minimal CAD by cath 2019, COPD, anxiety, arthritis, Sjogren's disease, macular degeneration, prior tobacco abuse, mild MR/AI seen for annual follow-up. She was remotely diagnosed with PAF during admission in 2019 prompting med adjustments and anticoagulation. Echo showed EF 55-60%, mild AI, trivial free flowing pericardial effusion, trivial MR. Stress testing was abnormal prompting cath showing minimal CAD (20% p-m LAD, 20% prox RCA), mild MR. F/u monitor showed NSR and occ PVCs. She had recurrent AF in 03/2022 but was in NSR when DCCV arranged and was in NSR at follow-up with Dr. Elease Hashimoto in 06/2022.  Echo Labs Lipids?  PAF Mild AI, mild TR by prior echo Mild CAD   Labwork independently reviewed: 03/2022 CBC OK Cr 0.8, K 4.9 07/2021 AST ALT OK -had low Na in setting of GI illness 2022 TSH wnl  ROS: .    Please see the history of present illness. Otherwise, review of systems is positive for ***.  All other systems are reviewed and otherwise negative.  Studies Reviewed: Marland Kitchen    EKG:  EKG is ordered today, personally reviewed, demonstrating ***  CV Studies: Cardiac studies reviewed are outlined and summarized above. Otherwise please see EMR for full report.   Current Reported Medications:.    No outpatient medications have been marked as taking for the 06/23/23 encounter (Appointment) with Laurann Montana, PA-C.    Physical Exam:    VS:  There were no vitals taken for this visit.   Wt Readings from Last 3 Encounters:  07/01/22 152 lb 9.6 oz (69.2 kg)  03/27/22 149 lb 9.6 oz (67.9 kg)  07/25/21 144 lb 10 oz (65.6 kg)    GEN: Well nourished,  well developed in no acute distress NECK: No JVD; No carotid bruits CARDIAC: ***RRR, no murmurs, rubs, gallops RESPIRATORY:  Clear to auscultation without rales, wheezing or rhonchi  ABDOMEN: Soft, non-tender, non-distended EXTREMITIES:  No edema; No acute deformity   Asessement and Plan:.     ***     Disposition: F/u with ***  Signed, Laurann Montana, PA-C

## 2023-06-23 ENCOUNTER — Ambulatory Visit: Payer: Medicare PPO | Attending: Nurse Practitioner | Admitting: Physician Assistant

## 2023-06-23 DIAGNOSIS — I480 Paroxysmal atrial fibrillation: Secondary | ICD-10-CM

## 2023-06-23 DIAGNOSIS — I080 Rheumatic disorders of both mitral and aortic valves: Secondary | ICD-10-CM

## 2023-06-23 DIAGNOSIS — I2510 Atherosclerotic heart disease of native coronary artery without angina pectoris: Secondary | ICD-10-CM

## 2023-07-02 ENCOUNTER — Ambulatory Visit: Payer: Medicare PPO | Admitting: Nurse Practitioner

## 2023-08-07 DIAGNOSIS — E559 Vitamin D deficiency, unspecified: Secondary | ICD-10-CM | POA: Diagnosis not present

## 2023-08-07 DIAGNOSIS — F5101 Primary insomnia: Secondary | ICD-10-CM | POA: Diagnosis not present

## 2023-08-07 DIAGNOSIS — Z0000 Encounter for general adult medical examination without abnormal findings: Secondary | ICD-10-CM | POA: Diagnosis not present

## 2023-08-07 DIAGNOSIS — E7800 Pure hypercholesterolemia, unspecified: Secondary | ICD-10-CM | POA: Diagnosis not present

## 2023-08-07 DIAGNOSIS — F320 Major depressive disorder, single episode, mild: Secondary | ICD-10-CM | POA: Diagnosis not present

## 2023-08-07 DIAGNOSIS — I480 Paroxysmal atrial fibrillation: Secondary | ICD-10-CM | POA: Diagnosis not present

## 2023-08-07 DIAGNOSIS — Z7901 Long term (current) use of anticoagulants: Secondary | ICD-10-CM | POA: Diagnosis not present

## 2023-08-07 DIAGNOSIS — R7303 Prediabetes: Secondary | ICD-10-CM | POA: Diagnosis not present

## 2023-08-07 DIAGNOSIS — J449 Chronic obstructive pulmonary disease, unspecified: Secondary | ICD-10-CM | POA: Diagnosis not present

## 2023-08-07 DIAGNOSIS — Z853 Personal history of malignant neoplasm of breast: Secondary | ICD-10-CM | POA: Diagnosis not present

## 2023-08-12 DIAGNOSIS — H3323 Serous retinal detachment, bilateral: Secondary | ICD-10-CM | POA: Diagnosis not present

## 2023-08-12 DIAGNOSIS — H353221 Exudative age-related macular degeneration, left eye, with active choroidal neovascularization: Secondary | ICD-10-CM | POA: Diagnosis not present

## 2023-08-12 DIAGNOSIS — H353132 Nonexudative age-related macular degeneration, bilateral, intermediate dry stage: Secondary | ICD-10-CM | POA: Diagnosis not present

## 2023-08-12 DIAGNOSIS — H43811 Vitreous degeneration, right eye: Secondary | ICD-10-CM | POA: Diagnosis not present

## 2023-08-12 DIAGNOSIS — H35051 Retinal neovascularization, unspecified, right eye: Secondary | ICD-10-CM | POA: Diagnosis not present

## 2023-08-13 DIAGNOSIS — H353211 Exudative age-related macular degeneration, right eye, with active choroidal neovascularization: Secondary | ICD-10-CM | POA: Diagnosis not present

## 2023-08-13 DIAGNOSIS — H353132 Nonexudative age-related macular degeneration, bilateral, intermediate dry stage: Secondary | ICD-10-CM | POA: Diagnosis not present

## 2023-08-13 DIAGNOSIS — H35051 Retinal neovascularization, unspecified, right eye: Secondary | ICD-10-CM | POA: Diagnosis not present

## 2023-08-13 DIAGNOSIS — H3323 Serous retinal detachment, bilateral: Secondary | ICD-10-CM | POA: Diagnosis not present

## 2023-08-13 DIAGNOSIS — H43811 Vitreous degeneration, right eye: Secondary | ICD-10-CM | POA: Diagnosis not present

## 2023-08-14 ENCOUNTER — Encounter: Payer: Self-pay | Admitting: Cardiovascular Disease

## 2023-08-14 NOTE — Progress Notes (Signed)
Cardiology Office Note:    Date:  08/15/2023   ID:  Michelle Gonzales, DOB 07-01-1941, MRN [id number]  PCP:  Noberto Retort, MD  Cardiologist:  Kristeen Miss, MD  Electrophysiologist:  None   Referring MD: Noberto Retort, MD   Problem list 1.  Atrial fibrillation -paroxysmal 2.  Breast cancer 3.  History of chest pain-heart catheterization in March, 2019 showed minimal CAD   Chief Complaint  Patient presents with   Atrial Fibrillation     Michelle Gonzales is a 83 y.o. female with a hx of atrial fibrillation.  She has been seen by Lizabeth Leyden, nurse practitioner.  I met her in the hospital in March, 2019. Has started back smoking  ( her husband continued to smoke inside the house )   Had PAF on the event monitor She cannot tell when she is in A. fib versus sinus rhythm.  She denies any chest pain, shortness of breath, or passing out.  Has not been exercising .    Sept. 11, 2020:  Doing well.  No cp or dyspnea.    Walks outside on occasion.   cannot go to the gym  Cannot feel when she is in atrial fib In on Eliquis. CBC and  BMP have been stable  Sept. 13, 2021:  Michelle Gonzales is seen today for follow up of her paroxysmal  atrial fib. Is on eliquis  Has lots of mucus production related to the Diltizem  These may be allergies Will DC and try metoprolol    March 30, 2021: Michelle Gonzales is seen today for follow-up of her paroxysmal atrial fibrillation.  She remains on Eliquis. Has started smoking again .    No CP , no dyspnea Is tolerating the metoprolol better than the Diltiazem   Dec. 11, 2023 Michelle Gonzales is seen for follow up of her PAF, on eliqus Quit smoking in March, 2023  Was sent to the hospital for cardioversion but she was in sinus rhythm at the time.  Cont meds. Will see in a year    Jan. 24, 2025 Michelle Gonzales is seen for  follow up of her PAF,  On eliquis   No recent episodes of PAF .   Exercising some     Past Medical History:  Diagnosis Date   Allergy     Pcn, Sulfa, Codeine   Anxiety    Arthritis    Atrial fibrillation (HCC) 09/22/2017   Cataract    surgey b/l   COPD (chronic obstructive pulmonary disease) (HCC)    Coronary artery disease involving native coronary artery of native heart without angina pectoris 03/26/2022   Minimal nonobstructive disease // Cath 09/2017: Proximal LAD 20, proximal RCA 20   Cough    Depression    hx years ago   Hordeolum internum right lower eyelid 05/11/2020   Hot flashes    Shortness of breath    Sjogren's disease (HCC)     Past Surgical History:  Procedure Laterality Date   ABDOMINAL HYSTERECTOMY  74   partial   BREAST LUMPECTOMY WITH NEEDLE LOCALIZATION AND AXILLARY SENTINEL LYMPH NODE BX Right 02/01/2013   Procedure: BREAST LUMPECTOMY WITH NEEDLE LOCALIZATION AND AXILLARY SENTINEL LYMPH NODE BX;  Surgeon: Adolph Pollack, MD;  Location: [facility];  Service: General;  Laterality: Right;   BREAST SURGERY Right 1965   biopsy   CERVICAL FUSION  2010   CHOLECYSTECTOMY  2002   COLONOSCOPY     DILATION AND CURETTAGE OF UTERUS  LEFT HEART CATH AND CORONARY ANGIOGRAPHY N/A 09/24/2017   Procedure: LEFT HEART CATH AND CORONARY ANGIOGRAPHY;  Surgeon: Marykay Lex, MD;  Location: [facility] INVASIVE CV LAB;  Service: Cardiovascular;  Laterality: N/A;   ULTRASOUND GUIDANCE FOR VASCULAR ACCESS Right 09/24/2017   Procedure: Ultrasound Guidance For Vascular Access;  Surgeon: Marykay Lex, MD;  Location: [facility] INVASIVE CV LAB;  Service: Cardiovascular;  Laterality: Right;    Current Medications: Current Meds  Medication Sig   albuterol (VENTOLIN HFA) 108 (90 Base) MCG/ACT inhaler Inhale 2 puffs into the lungs every 6 (six) hours as needed for wheezing or shortness of breath.   apixaban (ELIQUIS) 5 MG TABS tablet Take 1 tablet (5 mg total) by mouth 2 (two) times daily.   Carboxymethylcellul-Glycerin (LUBRICATING EYE DROPS OP) Place 1 drop into both eyes daily as needed (Dry Eyes).    cholecalciferol (VITAMIN D) 1000 units tablet Take 1,000 Units by mouth daily.    fluticasone (FLONASE) 50 MCG/ACT nasal spray Place 1 spray into both nostrils daily.   metoprolol tartrate (LOPRESSOR) 25 MG tablet TAKE 1 TABLET BY MOUTH TWICE DAILY   Misc Natural Products (OSTEO BI-FLEX TRIPLE STRENGTH PO) Take 1 tablet by mouth daily.   montelukast (SINGULAIR) 10 MG tablet Take 10 mg by mouth at bedtime.    sertraline (ZOLOFT) 50 MG tablet Take 50 mg by mouth daily.   traZODone (DESYREL) 50 MG tablet Take 0.5 tablets (25 mg total) by mouth at bedtime as needed for sleep.   vitamin C (ASCORBIC ACID) 500 MG tablet Take 500 mg by mouth daily.      Allergies:   Demerol [meperidine], Penicillins, Codeine, and Sulfa antibiotics   Social History   Socioeconomic History   Marital status: Married    Spouse name: Not on file   Number of children: Not on file   Years of education: Not on file   Highest education level: Not on file  Occupational History   Not on file  Tobacco Use   Smoking status: Every Day    Current packs/day: 0.00    Types: Cigarettes    Last attempt to quit: 10/07/2017    Years since quitting: 5.8   Smokeless tobacco: Never  Vaping Use   Vaping status: Never Used  Substance and Sexual Activity   Alcohol use: Not Currently   Drug use: No   Sexual activity: Yes  Other Topics Concern   Not on file  Social History Narrative   Not on file   Social Drivers of Health   Financial Resource Strain: Not on file  Food Insecurity: Not on file  Transportation Needs: Not on file  Physical Activity: Not on file  Stress: Not on file  Social Connections: Not on file     Family History: The patient's family history includes Breast cancer in her mother.  ROS:   Please see the history of present illness.     All other systems reviewed and are negative.  EKGs/Labs/Other Studies Reviewed:    The following studies were reviewed today:    Recent Labs: No results found  for requested labs within last 365 days.  Recent Lipid Panel    Component Value Date/Time   CHOL 171 09/21/2017 1001   TRIG 117 09/21/2017 1001   HDL 53 09/21/2017 1001   CHOLHDL 3.2 09/21/2017 1001   VLDL 23 09/21/2017 1001   LDLCALC 95 09/21/2017 1001    Physical Exam: Blood pressure 128/66, pulse 68, height 5\' 1"  (1.549 m), weight  150 lb 3.2 oz (68.1 kg), SpO2 94%.       GEN:  Well nourished, well developed in no acute distress HEENT: Normal NECK: No JVD; No carotid bruits LYMPHATICS: No lymphadenopathy CARDIAC: RRR , no murmurs, rubs, gallops RESPIRATORY:  Clear to auscultation without rales, wheezing or rhonchi  ABDOMEN: Soft, non-tender, non-distended MUSCULOSKELETAL:  No edema; No deformity  SKIN: Warm and dry NEUROLOGIC:  Alert and oriented x 3     EKG:       EKG Interpretation Date/Time:  Friday August 15 2023 13:37:54 EST Ventricular Rate:  68 PR Interval:  182 QRS Duration:  74 QT Interval:  410 QTC Calculation: 435 R Axis:   74  Text Interpretation: Normal sinus rhythm Right atrial enlargement Septal infarct , age undetermined When compared with ECG of 03-Apr-2022 08:25, No significant change was found Confirmed by Kristeen Miss ([id number]) on 08/15/2023 1:51:24 PM     ASSESSMENT:    1. Paroxysmal atrial fibrillation (HCC)   2. Coronary artery disease involving native coronary artery of native heart without angina pectoris       PLAN:       Paroxsymal atrial fib :   She has not had any recurrent episodes of atrial fibrillation.  She continues to be in normal sinus rhythm now. \ She will continue on Eliquis.  At this point I think that we can see her on an as-needed basis.  She will asked Dr. Tiburcio Pea or his partners to continue to refill her Eliquis.  She will need a CBC and basic metabolic profile on a yearly basis.   2.  Mild coronary artery disease:     Medication Adjustments/Labs and Tests Ordered: Current medicines are reviewed at length  with the patient today.  Concerns regarding medicines are outlined above.  Orders Placed This Encounter  Procedures   EKG 12-Lead    No orders of the defined types were placed in this encounter.    Patient Instructions  Medication Instructions:  Your physician recommends that you continue on your current medications as directed. Please refer to the Current Medication list given to you today.  *If you need a refill on your cardiac medications before your next appointment, please call your pharmacy*  Lab Work: If you have labs (blood work) drawn today and your tests are completely normal, you will receive your results only by: MyChart Message (if you have MyChart) OR A paper copy in the mail If you have any lab test that is abnormal or we need to change your treatment, we will call you to review the results.  Testing/Procedures: None ordered today.  Follow-Up: At [facility], you and your health needs are our priority.  As part of our continuing mission to provide you with exceptional heart care, we have created designated Provider Care Teams.  These Care Teams include your primary Cardiologist (physician) and Advanced Practice Providers (APPs -  Physician Assistants and Nurse Practitioners) who all work together to provide you with the care you need, when you need it.  We recommend signing up for the patient portal called "MyChart".  Sign up information is provided on this After Visit Summary.  MyChart is used to connect with patients for Virtual Visits (Telemedicine).  Patients are able to view lab/test results, encounter notes, upcoming appointments, etc.  Non-urgent messages can be sent to your provider as well.   To learn more about what you can do with MyChart, go to [email or web address].    Your next appointment:  As needed  Provider:   Kristeen Miss, MD             Signed, Kristeen Miss, MD  08/15/2023 1:58 PM    [facility]

## 2023-08-15 ENCOUNTER — Ambulatory Visit: Payer: Medicare PPO | Attending: Cardiovascular Disease | Admitting: Cardiovascular Disease

## 2023-08-15 ENCOUNTER — Encounter: Payer: Self-pay | Admitting: Cardiovascular Disease

## 2023-08-15 VITALS — BP 128/66 | HR 68 | Ht 61.0 in | Wt 150.2 lb

## 2023-08-15 DIAGNOSIS — I2510 Atherosclerotic heart disease of native coronary artery without angina pectoris: Secondary | ICD-10-CM | POA: Diagnosis not present

## 2023-08-15 DIAGNOSIS — I480 Paroxysmal atrial fibrillation: Secondary | ICD-10-CM | POA: Diagnosis not present

## 2023-08-15 NOTE — Patient Instructions (Signed)
Medication Instructions:  Your physician recommends that you continue on your current medications as directed. Please refer to the Current Medication list given to you today.  *If you need a refill on your cardiac medications before your next appointment, please call your pharmacy*  Lab Work: If you have labs (blood work) drawn today and your tests are completely normal, you will receive your results only by: MyChart Message (if you have MyChart) OR A paper copy in the mail If you have any lab test that is abnormal or we need to change your treatment, we will call you to review the results.  Testing/Procedures: None ordered today.  Follow-Up: At [facility], you and your health needs are our priority.  As part of our continuing mission to provide you with exceptional heart care, we have created designated Provider Care Teams.  These Care Teams include your primary Cardiologist (physician) and Advanced Practice Providers (APPs -  Physician Assistants and Nurse Practitioners) who all work together to provide you with the care you need, when you need it.  We recommend signing up for the patient portal called "MyChart".  Sign up information is provided on this After Visit Summary.  MyChart is used to connect with patients for Virtual Visits (Telemedicine).  Patients are able to view lab/test results, encounter notes, upcoming appointments, etc.  Non-urgent messages can be sent to your provider as well.   To learn more about what you can do with MyChart, go to [email or web address].    Your next appointment:   As needed   Provider:   Kristeen Miss, MD

## 2023-08-22 ENCOUNTER — Ambulatory Visit: Payer: Medicare PPO | Admitting: Physician Assistant

## 2023-09-01 DIAGNOSIS — L821 Other seborrheic keratosis: Secondary | ICD-10-CM | POA: Diagnosis not present

## 2023-09-01 DIAGNOSIS — L570 Actinic keratosis: Secondary | ICD-10-CM | POA: Diagnosis not present

## 2023-09-01 DIAGNOSIS — L820 Inflamed seborrheic keratosis: Secondary | ICD-10-CM | POA: Diagnosis not present

## 2023-09-09 ENCOUNTER — Encounter: Payer: Self-pay | Admitting: Internal Medicine

## 2023-09-09 ENCOUNTER — Ambulatory Visit: Payer: Medicare PPO | Admitting: Internal Medicine

## 2023-09-09 VITALS — BP 122/60 | HR 65 | Temp 97.8°F | Resp 18 | Ht 61.0 in | Wt 150.0 lb

## 2023-09-09 DIAGNOSIS — B001 Herpesviral vesicular dermatitis: Secondary | ICD-10-CM | POA: Diagnosis not present

## 2023-09-09 MED ORDER — VALACYCLOVIR HCL 1 G PO TABS: 2000.0000 mg | ORAL_TABLET | Freq: Two times a day (BID) | ORAL | 0 refills | Status: AC

## 2023-09-09 MED ORDER — ACYCLOVIR 5 % EX OINT: TOPICAL_OINTMENT | CUTANEOUS | 0 refills | Status: AC

## 2023-09-09 NOTE — Progress Notes (Unsigned)
   Acute Office Visit  Subjective:     Patient ID: Michelle Gonzales, female    DOB: 1940/09/03, 83 y.o.   MRN: [id number]  Chief Complaint  Patient presents with   office visit    Painful area in left nostril no blood    HPI Patient is in today for ***  ROS      Objective:    BP 122/60 (BP Location: Left Arm, Patient Position: Sitting, Cuff Size: Normal)   Pulse 65   Temp 97.8 F (36.6 C)   Resp 18   Ht 5\' 1"  (1.549 m)   Wt 150 lb (68 kg)   SpO2 94%   BMI 28.34 kg/m  {Vitals History (Optional):23777}  Physical Exam  No results found for any visits on 09/09/23.      Assessment & Plan:   Problem List Items Addressed This Visit   None   No orders of the defined types were placed in this encounter.   No follow-ups on file.  Eloisa Northern, MD

## 2023-09-11 NOTE — Assessment & Plan Note (Signed)
I believe he has a cold so and I will start her on acyclavir 2000 mg twice a day 4 point day.  If pain is not better she will call.

## 2023-09-25 DIAGNOSIS — H3323 Serous retinal detachment, bilateral: Secondary | ICD-10-CM | POA: Diagnosis not present

## 2023-09-25 DIAGNOSIS — H353211 Exudative age-related macular degeneration, right eye, with active choroidal neovascularization: Secondary | ICD-10-CM | POA: Diagnosis not present

## 2023-09-25 DIAGNOSIS — H353132 Nonexudative age-related macular degeneration, bilateral, intermediate dry stage: Secondary | ICD-10-CM | POA: Diagnosis not present

## 2023-09-25 DIAGNOSIS — H35051 Retinal neovascularization, unspecified, right eye: Secondary | ICD-10-CM | POA: Diagnosis not present

## 2023-09-25 DIAGNOSIS — H43811 Vitreous degeneration, right eye: Secondary | ICD-10-CM | POA: Diagnosis not present

## 2023-10-07 DIAGNOSIS — H3323 Serous retinal detachment, bilateral: Secondary | ICD-10-CM | POA: Diagnosis not present

## 2023-10-07 DIAGNOSIS — H353221 Exudative age-related macular degeneration, left eye, with active choroidal neovascularization: Secondary | ICD-10-CM | POA: Diagnosis not present

## 2023-10-07 DIAGNOSIS — H43811 Vitreous degeneration, right eye: Secondary | ICD-10-CM | POA: Diagnosis not present

## 2023-10-07 DIAGNOSIS — H353132 Nonexudative age-related macular degeneration, bilateral, intermediate dry stage: Secondary | ICD-10-CM | POA: Diagnosis not present

## 2023-10-07 DIAGNOSIS — H35051 Retinal neovascularization, unspecified, right eye: Secondary | ICD-10-CM | POA: Diagnosis not present

## 2023-10-11 DIAGNOSIS — J4489 Other specified chronic obstructive pulmonary disease: Secondary | ICD-10-CM | POA: Diagnosis not present

## 2023-10-11 DIAGNOSIS — I10 Essential (primary) hypertension: Secondary | ICD-10-CM | POA: Diagnosis not present

## 2023-10-11 DIAGNOSIS — F419 Anxiety disorder, unspecified: Secondary | ICD-10-CM | POA: Diagnosis not present

## 2023-10-11 DIAGNOSIS — E785 Hyperlipidemia, unspecified: Secondary | ICD-10-CM | POA: Diagnosis not present

## 2023-10-11 DIAGNOSIS — F325 Major depressive disorder, single episode, in full remission: Secondary | ICD-10-CM | POA: Diagnosis not present

## 2023-10-11 DIAGNOSIS — D6869 Other thrombophilia: Secondary | ICD-10-CM | POA: Diagnosis not present

## 2023-10-11 DIAGNOSIS — I4891 Unspecified atrial fibrillation: Secondary | ICD-10-CM | POA: Diagnosis not present

## 2023-10-11 DIAGNOSIS — H353290 Exudative age-related macular degeneration, unspecified eye, stage unspecified: Secondary | ICD-10-CM | POA: Diagnosis not present

## 2023-10-11 DIAGNOSIS — I4892 Unspecified atrial flutter: Secondary | ICD-10-CM | POA: Diagnosis not present

## 2023-11-06 DIAGNOSIS — H3322 Serous retinal detachment, left eye: Secondary | ICD-10-CM | POA: Diagnosis not present

## 2023-11-06 DIAGNOSIS — H3321 Serous retinal detachment, right eye: Secondary | ICD-10-CM | POA: Diagnosis not present

## 2023-11-06 DIAGNOSIS — H35051 Retinal neovascularization, unspecified, right eye: Secondary | ICD-10-CM | POA: Diagnosis not present

## 2023-11-06 DIAGNOSIS — H353122 Nonexudative age-related macular degeneration, left eye, intermediate dry stage: Secondary | ICD-10-CM | POA: Diagnosis not present

## 2023-11-06 DIAGNOSIS — H43811 Vitreous degeneration, right eye: Secondary | ICD-10-CM | POA: Diagnosis not present

## 2023-11-06 DIAGNOSIS — H353211 Exudative age-related macular degeneration, right eye, with active choroidal neovascularization: Secondary | ICD-10-CM | POA: Diagnosis not present

## 2023-11-06 DIAGNOSIS — H353112 Nonexudative age-related macular degeneration, right eye, intermediate dry stage: Secondary | ICD-10-CM | POA: Diagnosis not present

## 2023-11-13 ENCOUNTER — Other Ambulatory Visit: Payer: Self-pay | Admitting: *Deleted

## 2023-11-13 ENCOUNTER — Other Ambulatory Visit: Payer: Self-pay | Admitting: Cardiovascular Disease

## 2023-11-13 DIAGNOSIS — I480 Paroxysmal atrial fibrillation: Secondary | ICD-10-CM

## 2023-11-13 MED ORDER — APIXABAN 5 MG PO TABS: 5.0000 mg | ORAL_TABLET | Freq: Two times a day (BID) | ORAL | 1 refills | Status: DC

## 2023-11-13 NOTE — Telephone Encounter (Signed)
 Eliquis  5mg  refill request received. Patient is 83 years old, weight-68kg, Crea-0.87 on 08/12/23 via Care Everywhere from Dr Raquel Cables office, Diagnosis-Afib, and last seen by Dr. Alroy Aspen on 08/15/23. Dose is appropriate based on dosing criteria. Will send in refill to requested pharmacy.    Previously sent today for 90 tabs and that is not a 3 month supply but 1.5 months

## 2023-11-13 NOTE — Addendum Note (Signed)
 Addended by: Tyrus Gallus B on: 11/13/2023 04:50 PM   Modules accepted: Orders

## 2023-12-18 DIAGNOSIS — H353132 Nonexudative age-related macular degeneration, bilateral, intermediate dry stage: Secondary | ICD-10-CM | POA: Diagnosis not present

## 2023-12-18 DIAGNOSIS — H353221 Exudative age-related macular degeneration, left eye, with active choroidal neovascularization: Secondary | ICD-10-CM | POA: Diagnosis not present

## 2023-12-18 DIAGNOSIS — H3323 Serous retinal detachment, bilateral: Secondary | ICD-10-CM | POA: Diagnosis not present

## 2023-12-18 DIAGNOSIS — H43811 Vitreous degeneration, right eye: Secondary | ICD-10-CM | POA: Diagnosis not present

## 2023-12-18 DIAGNOSIS — H35051 Retinal neovascularization, unspecified, right eye: Secondary | ICD-10-CM | POA: Diagnosis not present

## 2024-01-06 DIAGNOSIS — H353221 Exudative age-related macular degeneration, left eye, with active choroidal neovascularization: Secondary | ICD-10-CM | POA: Diagnosis not present

## 2024-01-06 DIAGNOSIS — H353211 Exudative age-related macular degeneration, right eye, with active choroidal neovascularization: Secondary | ICD-10-CM | POA: Diagnosis not present

## 2024-01-06 DIAGNOSIS — H353122 Nonexudative age-related macular degeneration, left eye, intermediate dry stage: Secondary | ICD-10-CM | POA: Diagnosis not present

## 2024-01-06 DIAGNOSIS — H3321 Serous retinal detachment, right eye: Secondary | ICD-10-CM | POA: Diagnosis not present

## 2024-01-06 DIAGNOSIS — H3322 Serous retinal detachment, left eye: Secondary | ICD-10-CM | POA: Diagnosis not present

## 2024-01-06 DIAGNOSIS — H43811 Vitreous degeneration, right eye: Secondary | ICD-10-CM | POA: Diagnosis not present

## 2024-01-06 DIAGNOSIS — H3323 Serous retinal detachment, bilateral: Secondary | ICD-10-CM | POA: Diagnosis not present

## 2024-01-06 DIAGNOSIS — H353112 Nonexudative age-related macular degeneration, right eye, intermediate dry stage: Secondary | ICD-10-CM | POA: Diagnosis not present

## 2024-01-15 ENCOUNTER — Other Ambulatory Visit: Payer: Self-pay | Admitting: Cardiovascular Disease

## 2024-02-02 DIAGNOSIS — L814 Other melanin hyperpigmentation: Secondary | ICD-10-CM | POA: Diagnosis not present

## 2024-02-02 DIAGNOSIS — L821 Other seborrheic keratosis: Secondary | ICD-10-CM | POA: Diagnosis not present

## 2024-02-02 DIAGNOSIS — L570 Actinic keratosis: Secondary | ICD-10-CM | POA: Diagnosis not present

## 2024-02-05 DIAGNOSIS — E559 Vitamin D deficiency, unspecified: Secondary | ICD-10-CM | POA: Diagnosis not present

## 2024-02-05 DIAGNOSIS — R7303 Prediabetes: Secondary | ICD-10-CM | POA: Diagnosis not present

## 2024-02-05 DIAGNOSIS — F320 Major depressive disorder, single episode, mild: Secondary | ICD-10-CM | POA: Diagnosis not present

## 2024-02-05 DIAGNOSIS — I480 Paroxysmal atrial fibrillation: Secondary | ICD-10-CM | POA: Diagnosis not present

## 2024-02-05 DIAGNOSIS — Z7901 Long term (current) use of anticoagulants: Secondary | ICD-10-CM | POA: Diagnosis not present

## 2024-02-05 DIAGNOSIS — E7800 Pure hypercholesterolemia, unspecified: Secondary | ICD-10-CM | POA: Diagnosis not present

## 2024-02-05 DIAGNOSIS — J449 Chronic obstructive pulmonary disease, unspecified: Secondary | ICD-10-CM | POA: Diagnosis not present

## 2024-02-05 DIAGNOSIS — F5101 Primary insomnia: Secondary | ICD-10-CM | POA: Diagnosis not present

## 2024-02-05 DIAGNOSIS — J309 Allergic rhinitis, unspecified: Secondary | ICD-10-CM | POA: Diagnosis not present

## 2024-02-18 DIAGNOSIS — H43811 Vitreous degeneration, right eye: Secondary | ICD-10-CM | POA: Diagnosis not present

## 2024-02-18 DIAGNOSIS — H353112 Nonexudative age-related macular degeneration, right eye, intermediate dry stage: Secondary | ICD-10-CM | POA: Diagnosis not present

## 2024-02-18 DIAGNOSIS — H3321 Serous retinal detachment, right eye: Secondary | ICD-10-CM | POA: Diagnosis not present

## 2024-02-18 DIAGNOSIS — H353211 Exudative age-related macular degeneration, right eye, with active choroidal neovascularization: Secondary | ICD-10-CM | POA: Diagnosis not present

## 2024-02-18 DIAGNOSIS — H35051 Retinal neovascularization, unspecified, right eye: Secondary | ICD-10-CM | POA: Diagnosis not present

## 2024-02-26 DIAGNOSIS — H3321 Serous retinal detachment, right eye: Secondary | ICD-10-CM | POA: Diagnosis not present

## 2024-02-26 DIAGNOSIS — H35051 Retinal neovascularization, unspecified, right eye: Secondary | ICD-10-CM | POA: Diagnosis not present

## 2024-02-26 DIAGNOSIS — H353211 Exudative age-related macular degeneration, right eye, with active choroidal neovascularization: Secondary | ICD-10-CM | POA: Diagnosis not present

## 2024-02-26 DIAGNOSIS — H3322 Serous retinal detachment, left eye: Secondary | ICD-10-CM | POA: Diagnosis not present

## 2024-02-26 DIAGNOSIS — H353122 Nonexudative age-related macular degeneration, left eye, intermediate dry stage: Secondary | ICD-10-CM | POA: Diagnosis not present

## 2024-02-26 DIAGNOSIS — H43811 Vitreous degeneration, right eye: Secondary | ICD-10-CM | POA: Diagnosis not present

## 2024-02-26 DIAGNOSIS — H353112 Nonexudative age-related macular degeneration, right eye, intermediate dry stage: Secondary | ICD-10-CM | POA: Diagnosis not present

## 2024-02-26 DIAGNOSIS — H353221 Exudative age-related macular degeneration, left eye, with active choroidal neovascularization: Secondary | ICD-10-CM | POA: Diagnosis not present

## 2024-03-29 DIAGNOSIS — L821 Other seborrheic keratosis: Secondary | ICD-10-CM | POA: Diagnosis not present

## 2024-03-29 DIAGNOSIS — L578 Other skin changes due to chronic exposure to nonionizing radiation: Secondary | ICD-10-CM | POA: Diagnosis not present

## 2024-03-29 DIAGNOSIS — L905 Scar conditions and fibrosis of skin: Secondary | ICD-10-CM | POA: Diagnosis not present

## 2024-03-29 DIAGNOSIS — D1801 Hemangioma of skin and subcutaneous tissue: Secondary | ICD-10-CM | POA: Diagnosis not present

## 2024-03-29 DIAGNOSIS — L814 Other melanin hyperpigmentation: Secondary | ICD-10-CM | POA: Diagnosis not present

## 2024-03-31 DIAGNOSIS — H35051 Retinal neovascularization, unspecified, right eye: Secondary | ICD-10-CM | POA: Diagnosis not present

## 2024-03-31 DIAGNOSIS — H353112 Nonexudative age-related macular degeneration, right eye, intermediate dry stage: Secondary | ICD-10-CM | POA: Diagnosis not present

## 2024-03-31 DIAGNOSIS — H353122 Nonexudative age-related macular degeneration, left eye, intermediate dry stage: Secondary | ICD-10-CM | POA: Diagnosis not present

## 2024-03-31 DIAGNOSIS — H3321 Serous retinal detachment, right eye: Secondary | ICD-10-CM | POA: Diagnosis not present

## 2024-03-31 DIAGNOSIS — H43811 Vitreous degeneration, right eye: Secondary | ICD-10-CM | POA: Diagnosis not present

## 2024-03-31 DIAGNOSIS — H3322 Serous retinal detachment, left eye: Secondary | ICD-10-CM | POA: Diagnosis not present

## 2024-03-31 DIAGNOSIS — H353211 Exudative age-related macular degeneration, right eye, with active choroidal neovascularization: Secondary | ICD-10-CM | POA: Diagnosis not present

## 2024-03-31 DIAGNOSIS — H353221 Exudative age-related macular degeneration, left eye, with active choroidal neovascularization: Secondary | ICD-10-CM | POA: Diagnosis not present

## 2024-05-17 ENCOUNTER — Other Ambulatory Visit: Payer: Self-pay

## 2024-05-17 DIAGNOSIS — I480 Paroxysmal atrial fibrillation: Secondary | ICD-10-CM

## 2024-05-18 ENCOUNTER — Other Ambulatory Visit: Payer: Self-pay | Admitting: Physician Assistant

## 2024-05-18 DIAGNOSIS — H35051 Retinal neovascularization, unspecified, right eye: Secondary | ICD-10-CM | POA: Diagnosis not present

## 2024-05-18 DIAGNOSIS — H353132 Nonexudative age-related macular degeneration, bilateral, intermediate dry stage: Secondary | ICD-10-CM | POA: Diagnosis not present

## 2024-05-18 DIAGNOSIS — H43811 Vitreous degeneration, right eye: Secondary | ICD-10-CM | POA: Diagnosis not present

## 2024-05-18 DIAGNOSIS — H353211 Exudative age-related macular degeneration, right eye, with active choroidal neovascularization: Secondary | ICD-10-CM | POA: Diagnosis not present

## 2024-05-18 DIAGNOSIS — H3323 Serous retinal detachment, bilateral: Secondary | ICD-10-CM | POA: Diagnosis not present

## 2024-05-18 DIAGNOSIS — I480 Paroxysmal atrial fibrillation: Secondary | ICD-10-CM

## 2024-05-18 MED ORDER — APIXABAN 5 MG PO TABS
5.0000 mg | ORAL_TABLET | Freq: Two times a day (BID) | ORAL | 1 refills | Status: AC
Start: 2024-05-18 — End: ?

## 2024-05-18 NOTE — Telephone Encounter (Signed)
 Prescription refill request for Eliquis  received. Indication:afib Last office visit:1/25 Scr:0.87  2025 Age: 83 Weight:68  kg  Prescription refilled

## 2024-05-19 MED ORDER — METOPROLOL TARTRATE 25 MG PO TABS
25.0000 mg | ORAL_TABLET | Freq: Two times a day (BID) | ORAL | 0 refills | Status: AC
Start: 2024-05-19 — End: ?

## 2024-05-26 DIAGNOSIS — H353122 Nonexudative age-related macular degeneration, left eye, intermediate dry stage: Secondary | ICD-10-CM | POA: Diagnosis not present

## 2024-05-26 DIAGNOSIS — H3322 Serous retinal detachment, left eye: Secondary | ICD-10-CM | POA: Diagnosis not present

## 2024-05-26 DIAGNOSIS — H35051 Retinal neovascularization, unspecified, right eye: Secondary | ICD-10-CM | POA: Diagnosis not present

## 2024-05-26 DIAGNOSIS — H3321 Serous retinal detachment, right eye: Secondary | ICD-10-CM | POA: Diagnosis not present

## 2024-05-26 DIAGNOSIS — H353221 Exudative age-related macular degeneration, left eye, with active choroidal neovascularization: Secondary | ICD-10-CM | POA: Diagnosis not present

## 2024-05-26 DIAGNOSIS — H353211 Exudative age-related macular degeneration, right eye, with active choroidal neovascularization: Secondary | ICD-10-CM | POA: Diagnosis not present

## 2024-05-26 DIAGNOSIS — H353112 Nonexudative age-related macular degeneration, right eye, intermediate dry stage: Secondary | ICD-10-CM | POA: Diagnosis not present

## 2024-05-26 DIAGNOSIS — H43811 Vitreous degeneration, right eye: Secondary | ICD-10-CM | POA: Diagnosis not present

## 2024-06-16 DIAGNOSIS — Z1231 Encounter for screening mammogram for malignant neoplasm of breast: Secondary | ICD-10-CM | POA: Diagnosis not present

## 2024-07-05 ENCOUNTER — Other Ambulatory Visit: Payer: Self-pay | Admitting: Physician Assistant

## 2024-08-19 ENCOUNTER — Ambulatory Visit: Admitting: Student in an Organized Health Care Education/Training Program

## 2024-09-10 ENCOUNTER — Ambulatory Visit: Admitting: Student in an Organized Health Care Education/Training Program
